# Patient Record
Sex: Female | Born: 1945
Health system: Southern US, Community
[De-identification: ages and names within clinical notes are randomized; demographics above are authoritative.]

## PROBLEM LIST (undated history)

## (undated) DIAGNOSIS — E079 Disorder of thyroid, unspecified: Secondary | ICD-10-CM

## (undated) DIAGNOSIS — F419 Anxiety disorder, unspecified: Secondary | ICD-10-CM

## (undated) DIAGNOSIS — N3281 Overactive bladder: Secondary | ICD-10-CM

## (undated) DIAGNOSIS — R1011 Right upper quadrant pain: Secondary | ICD-10-CM

## (undated) DIAGNOSIS — F32A Depression, unspecified: Secondary | ICD-10-CM

## (undated) DIAGNOSIS — C50919 Malignant neoplasm of unspecified site of unspecified female breast: Secondary | ICD-10-CM

## (undated) DIAGNOSIS — I10 Essential (primary) hypertension: Secondary | ICD-10-CM

## (undated) DIAGNOSIS — J189 Pneumonia, unspecified organism: Secondary | ICD-10-CM

## (undated) DIAGNOSIS — E039 Hypothyroidism, unspecified: Secondary | ICD-10-CM

## (undated) DIAGNOSIS — K219 Gastro-esophageal reflux disease without esophagitis: Secondary | ICD-10-CM

## (undated) DIAGNOSIS — G43909 Migraine, unspecified, not intractable, without status migrainosus: Secondary | ICD-10-CM

## (undated) DIAGNOSIS — M797 Fibromyalgia: Secondary | ICD-10-CM

## (undated) DIAGNOSIS — F329 Major depressive disorder, single episode, unspecified: Secondary | ICD-10-CM

## (undated) DIAGNOSIS — G4730 Sleep apnea, unspecified: Secondary | ICD-10-CM

## (undated) HISTORY — PX: TUBAL LIGATION: SHX77

## (undated) HISTORY — PX: OTHER SURGICAL HISTORY: SHX169

## (undated) HISTORY — PX: LIVER SURGERY: SHX698

## (undated) HISTORY — PX: APPENDECTOMY: SHX54

---

## 2000-08-09 ENCOUNTER — Other Ambulatory Visit: Admission: RE | Admit: 2000-08-09 | Discharge: 2000-08-09 | Payer: Self-pay | Admitting: *Deleted

## 2005-07-13 ENCOUNTER — Encounter (INDEPENDENT_AMBULATORY_CARE_PROVIDER_SITE_OTHER): Payer: Self-pay | Admitting: *Deleted

## 2005-08-06 ENCOUNTER — Ambulatory Visit: Payer: Self-pay | Admitting: Family Medicine

## 2005-08-17 ENCOUNTER — Ambulatory Visit: Payer: Self-pay | Admitting: Family Medicine

## 2005-12-17 ENCOUNTER — Ambulatory Visit: Payer: Self-pay | Admitting: Family Medicine

## 2006-05-03 ENCOUNTER — Ambulatory Visit: Payer: Self-pay | Admitting: Sports Medicine

## 2007-02-10 ENCOUNTER — Encounter (INDEPENDENT_AMBULATORY_CARE_PROVIDER_SITE_OTHER): Payer: Self-pay | Admitting: *Deleted

## 2007-07-15 ENCOUNTER — Emergency Department (HOSPITAL_COMMUNITY): Admission: EM | Admit: 2007-07-15 | Discharge: 2007-07-15 | Payer: Self-pay | Admitting: Emergency Medicine

## 2009-12-13 DIAGNOSIS — C50919 Malignant neoplasm of unspecified site of unspecified female breast: Secondary | ICD-10-CM

## 2009-12-13 HISTORY — PX: SIMPLE MASTECTOMY W/ SENTINEL NODE BIOPSY: SHX2410

## 2009-12-13 HISTORY — DX: Malignant neoplasm of unspecified site of unspecified female breast: C50.919

## 2011-01-27 ENCOUNTER — Other Ambulatory Visit: Payer: Self-pay | Admitting: Internal Medicine

## 2011-01-27 DIAGNOSIS — N281 Cyst of kidney, acquired: Secondary | ICD-10-CM

## 2011-01-27 DIAGNOSIS — K7689 Other specified diseases of liver: Secondary | ICD-10-CM

## 2011-01-29 ENCOUNTER — Other Ambulatory Visit: Payer: Self-pay | Admitting: Internal Medicine

## 2011-01-29 ENCOUNTER — Ambulatory Visit
Admission: RE | Admit: 2011-01-29 | Discharge: 2011-01-29 | Disposition: A | Discharge status: Home / Self Care | Payer: Medicare Other | Source: Ambulatory Visit | Attending: Internal Medicine | Admitting: Internal Medicine

## 2011-01-29 DIAGNOSIS — N281 Cyst of kidney, acquired: Secondary | ICD-10-CM

## 2011-01-29 DIAGNOSIS — K7689 Other specified diseases of liver: Secondary | ICD-10-CM

## 2011-01-29 MED ORDER — IOHEXOL 300 MG/ML  SOLN
100.0000 mL | Freq: Once | INTRAMUSCULAR | Status: AC | PRN
  Administered 2011-01-29: 100 mL via INTRAVENOUS

## 2011-03-09 ENCOUNTER — Other Ambulatory Visit: Payer: Self-pay | Admitting: Gastroenterology

## 2011-03-09 DIAGNOSIS — R1084 Generalized abdominal pain: Secondary | ICD-10-CM

## 2011-03-09 DIAGNOSIS — K5289 Other specified noninfective gastroenteritis and colitis: Secondary | ICD-10-CM

## 2011-03-17 ENCOUNTER — Other Ambulatory Visit: Payer: Medicare Other

## 2011-03-18 ENCOUNTER — Ambulatory Visit
Admission: RE | Admit: 2011-03-18 | Discharge: 2011-03-18 | Disposition: A | Discharge status: Home / Self Care | Payer: Medicare Other | Source: Ambulatory Visit | Attending: Gastroenterology | Admitting: Gastroenterology

## 2011-03-18 DIAGNOSIS — K5289 Other specified noninfective gastroenteritis and colitis: Secondary | ICD-10-CM

## 2011-03-18 DIAGNOSIS — R1084 Generalized abdominal pain: Secondary | ICD-10-CM

## 2011-04-06 ENCOUNTER — Other Ambulatory Visit (HOSPITAL_COMMUNITY): Payer: Self-pay | Admitting: Gastroenterology

## 2011-04-06 DIAGNOSIS — R1011 Right upper quadrant pain: Secondary | ICD-10-CM

## 2011-04-21 ENCOUNTER — Encounter (HOSPITAL_COMMUNITY): Payer: Self-pay

## 2011-04-21 ENCOUNTER — Encounter (HOSPITAL_COMMUNITY)
Admission: RE | Admit: 2011-04-21 | Discharge: 2011-04-21 | Disposition: A | Discharge status: Home / Self Care | Payer: Medicare Other | Source: Ambulatory Visit | Attending: Gastroenterology | Admitting: Gastroenterology

## 2011-04-21 DIAGNOSIS — R1011 Right upper quadrant pain: Secondary | ICD-10-CM | POA: Insufficient documentation

## 2011-04-21 HISTORY — DX: Right upper quadrant pain: R10.11

## 2011-04-21 MED ORDER — SINCALIDE 5 MCG IJ SOLR: 0.0200 ug/kg | Freq: Once | INTRAMUSCULAR | Status: DC

## 2011-04-21 MED ORDER — TECHNETIUM TC 99M MEBROFENIN IV KIT
5.0000 | PACK | Freq: Once | INTRAVENOUS | Status: AC | PRN
  Administered 2011-04-21: 5 via INTRAVENOUS

## 2011-05-03 ENCOUNTER — Other Ambulatory Visit: Payer: Self-pay | Admitting: Internal Medicine

## 2011-05-03 DIAGNOSIS — N281 Cyst of kidney, acquired: Secondary | ICD-10-CM

## 2011-05-05 ENCOUNTER — Other Ambulatory Visit: Payer: Medicare Other

## 2011-05-07 ENCOUNTER — Ambulatory Visit
Admission: RE | Admit: 2011-05-07 | Discharge: 2011-05-07 | Disposition: A | Discharge status: Home / Self Care | Payer: Medicare Other | Source: Ambulatory Visit | Attending: Internal Medicine | Admitting: Internal Medicine

## 2011-05-07 DIAGNOSIS — N281 Cyst of kidney, acquired: Secondary | ICD-10-CM

## 2011-09-27 LAB — POCT URINALYSIS DIP (DEVICE)
Bilirubin Urine: NEGATIVE
Glucose, UA: NEGATIVE
Ketones, ur: NEGATIVE
Operator id: 239701
Protein, ur: 100 — AB

## 2011-09-27 LAB — URINE CULTURE

## 2011-12-21 DIAGNOSIS — K219 Gastro-esophageal reflux disease without esophagitis: Secondary | ICD-10-CM | POA: Diagnosis not present

## 2011-12-21 DIAGNOSIS — Z79899 Other long term (current) drug therapy: Secondary | ICD-10-CM | POA: Diagnosis not present

## 2011-12-21 DIAGNOSIS — F341 Dysthymic disorder: Secondary | ICD-10-CM | POA: Diagnosis not present

## 2011-12-21 DIAGNOSIS — Z880 Allergy status to penicillin: Secondary | ICD-10-CM | POA: Diagnosis not present

## 2011-12-21 DIAGNOSIS — Z803 Family history of malignant neoplasm of breast: Secondary | ICD-10-CM | POA: Diagnosis not present

## 2011-12-21 DIAGNOSIS — J309 Allergic rhinitis, unspecified: Secondary | ICD-10-CM | POA: Diagnosis not present

## 2011-12-21 DIAGNOSIS — Z888 Allergy status to other drugs, medicaments and biological substances status: Secondary | ICD-10-CM | POA: Diagnosis not present

## 2011-12-21 DIAGNOSIS — D126 Benign neoplasm of colon, unspecified: Secondary | ICD-10-CM | POA: Diagnosis not present

## 2011-12-21 DIAGNOSIS — K5090 Crohn's disease, unspecified, without complications: Secondary | ICD-10-CM | POA: Diagnosis not present

## 2011-12-21 DIAGNOSIS — Z8049 Family history of malignant neoplasm of other genital organs: Secondary | ICD-10-CM | POA: Diagnosis not present

## 2011-12-21 DIAGNOSIS — Z8541 Personal history of malignant neoplasm of cervix uteri: Secondary | ICD-10-CM | POA: Diagnosis not present

## 2011-12-21 DIAGNOSIS — Z853 Personal history of malignant neoplasm of breast: Secondary | ICD-10-CM | POA: Diagnosis not present

## 2011-12-21 DIAGNOSIS — Z9010 Acquired absence of unspecified breast and nipple: Secondary | ICD-10-CM | POA: Diagnosis not present

## 2011-12-21 DIAGNOSIS — Z8042 Family history of malignant neoplasm of prostate: Secondary | ICD-10-CM | POA: Diagnosis not present

## 2012-01-14 DIAGNOSIS — G4733 Obstructive sleep apnea (adult) (pediatric): Secondary | ICD-10-CM | POA: Diagnosis not present

## 2012-02-28 DIAGNOSIS — R159 Full incontinence of feces: Secondary | ICD-10-CM | POA: Diagnosis not present

## 2012-02-28 DIAGNOSIS — K5090 Crohn's disease, unspecified, without complications: Secondary | ICD-10-CM | POA: Diagnosis not present

## 2012-04-12 DIAGNOSIS — R300 Dysuria: Secondary | ICD-10-CM | POA: Diagnosis not present

## 2012-04-12 DIAGNOSIS — R82998 Other abnormal findings in urine: Secondary | ICD-10-CM | POA: Diagnosis not present

## 2012-05-09 DIAGNOSIS — E785 Hyperlipidemia, unspecified: Secondary | ICD-10-CM | POA: Diagnosis not present

## 2012-05-09 DIAGNOSIS — R7301 Impaired fasting glucose: Secondary | ICD-10-CM | POA: Diagnosis not present

## 2012-05-09 DIAGNOSIS — E039 Hypothyroidism, unspecified: Secondary | ICD-10-CM | POA: Diagnosis not present

## 2012-05-09 DIAGNOSIS — I10 Essential (primary) hypertension: Secondary | ICD-10-CM | POA: Diagnosis not present

## 2012-05-09 DIAGNOSIS — R82998 Other abnormal findings in urine: Secondary | ICD-10-CM | POA: Diagnosis not present

## 2012-05-15 DIAGNOSIS — Z23 Encounter for immunization: Secondary | ICD-10-CM | POA: Diagnosis not present

## 2012-05-15 DIAGNOSIS — F329 Major depressive disorder, single episode, unspecified: Secondary | ICD-10-CM | POA: Diagnosis not present

## 2012-05-15 DIAGNOSIS — E785 Hyperlipidemia, unspecified: Secondary | ICD-10-CM | POA: Diagnosis not present

## 2012-05-15 DIAGNOSIS — I10 Essential (primary) hypertension: Secondary | ICD-10-CM | POA: Diagnosis not present

## 2012-05-15 DIAGNOSIS — E039 Hypothyroidism, unspecified: Secondary | ICD-10-CM | POA: Diagnosis not present

## 2012-05-15 DIAGNOSIS — Z0000 Encounter for general adult medical examination without abnormal findings: Secondary | ICD-10-CM | POA: Diagnosis not present

## 2012-05-15 DIAGNOSIS — K5090 Crohn's disease, unspecified, without complications: Secondary | ICD-10-CM | POA: Diagnosis not present

## 2012-05-29 ENCOUNTER — Other Ambulatory Visit: Payer: Self-pay | Admitting: Internal Medicine

## 2012-05-29 DIAGNOSIS — R222 Localized swelling, mass and lump, trunk: Secondary | ICD-10-CM

## 2012-05-30 ENCOUNTER — Ambulatory Visit
Admission: RE | Admit: 2012-05-30 | Discharge: 2012-05-30 | Disposition: A | Discharge status: Home / Self Care | Payer: Medicare Other | Source: Ambulatory Visit | Attending: Internal Medicine | Admitting: Internal Medicine

## 2012-05-30 DIAGNOSIS — J8410 Pulmonary fibrosis, unspecified: Secondary | ICD-10-CM | POA: Diagnosis not present

## 2012-05-30 DIAGNOSIS — R918 Other nonspecific abnormal finding of lung field: Secondary | ICD-10-CM | POA: Diagnosis not present

## 2012-05-30 DIAGNOSIS — R222 Localized swelling, mass and lump, trunk: Secondary | ICD-10-CM

## 2012-08-23 DIAGNOSIS — Z23 Encounter for immunization: Secondary | ICD-10-CM | POA: Diagnosis not present

## 2012-08-30 DIAGNOSIS — N393 Stress incontinence (female) (male): Secondary | ICD-10-CM | POA: Diagnosis not present

## 2012-08-30 DIAGNOSIS — N318 Other neuromuscular dysfunction of bladder: Secondary | ICD-10-CM | POA: Diagnosis not present

## 2012-08-30 DIAGNOSIS — R350 Frequency of micturition: Secondary | ICD-10-CM | POA: Diagnosis not present

## 2012-08-30 DIAGNOSIS — R3915 Urgency of urination: Secondary | ICD-10-CM | POA: Diagnosis not present

## 2012-09-21 DIAGNOSIS — K5090 Crohn's disease, unspecified, without complications: Secondary | ICD-10-CM | POA: Diagnosis not present

## 2012-09-21 DIAGNOSIS — R197 Diarrhea, unspecified: Secondary | ICD-10-CM | POA: Diagnosis not present

## 2012-10-05 DIAGNOSIS — R3915 Urgency of urination: Secondary | ICD-10-CM | POA: Diagnosis not present

## 2012-10-05 DIAGNOSIS — R197 Diarrhea, unspecified: Secondary | ICD-10-CM | POA: Diagnosis not present

## 2012-10-05 DIAGNOSIS — R350 Frequency of micturition: Secondary | ICD-10-CM | POA: Diagnosis not present

## 2012-10-05 DIAGNOSIS — N393 Stress incontinence (female) (male): Secondary | ICD-10-CM | POA: Diagnosis not present

## 2012-10-10 DIAGNOSIS — N318 Other neuromuscular dysfunction of bladder: Secondary | ICD-10-CM | POA: Diagnosis not present

## 2012-10-10 DIAGNOSIS — R350 Frequency of micturition: Secondary | ICD-10-CM | POA: Diagnosis not present

## 2012-10-10 DIAGNOSIS — Z124 Encounter for screening for malignant neoplasm of cervix: Secondary | ICD-10-CM | POA: Diagnosis not present

## 2012-10-10 DIAGNOSIS — R3915 Urgency of urination: Secondary | ICD-10-CM | POA: Diagnosis not present

## 2012-10-10 DIAGNOSIS — N393 Stress incontinence (female) (male): Secondary | ICD-10-CM | POA: Diagnosis not present

## 2012-10-19 DIAGNOSIS — R1904 Left lower quadrant abdominal swelling, mass and lump: Secondary | ICD-10-CM | POA: Diagnosis not present

## 2012-10-20 ENCOUNTER — Other Ambulatory Visit: Payer: Self-pay | Admitting: Gastroenterology

## 2012-10-20 DIAGNOSIS — K5090 Crohn's disease, unspecified, without complications: Secondary | ICD-10-CM | POA: Diagnosis not present

## 2012-10-20 DIAGNOSIS — R1031 Right lower quadrant pain: Secondary | ICD-10-CM

## 2012-10-20 DIAGNOSIS — R197 Diarrhea, unspecified: Secondary | ICD-10-CM | POA: Diagnosis not present

## 2012-10-30 ENCOUNTER — Ambulatory Visit
Admission: RE | Admit: 2012-10-30 | Discharge: 2012-10-30 | Disposition: A | Discharge status: Home / Self Care | Payer: Medicare Other | Source: Ambulatory Visit | Attending: Gastroenterology | Admitting: Gastroenterology

## 2012-10-30 DIAGNOSIS — R1031 Right lower quadrant pain: Secondary | ICD-10-CM

## 2012-10-30 DIAGNOSIS — K5090 Crohn's disease, unspecified, without complications: Secondary | ICD-10-CM | POA: Diagnosis not present

## 2012-10-30 DIAGNOSIS — N200 Calculus of kidney: Secondary | ICD-10-CM | POA: Diagnosis not present

## 2012-10-30 MED ORDER — IOHEXOL 300 MG/ML  SOLN
100.0000 mL | Freq: Once | INTRAMUSCULAR | Status: AC | PRN
  Administered 2012-10-30: 100 mL via INTRAVENOUS

## 2012-11-13 DIAGNOSIS — K5090 Crohn's disease, unspecified, without complications: Secondary | ICD-10-CM | POA: Diagnosis not present

## 2012-12-28 ENCOUNTER — Other Ambulatory Visit: Payer: Self-pay | Admitting: Internal Medicine

## 2012-12-28 DIAGNOSIS — R911 Solitary pulmonary nodule: Secondary | ICD-10-CM

## 2013-01-01 ENCOUNTER — Ambulatory Visit
Admission: RE | Admit: 2013-01-01 | Discharge: 2013-01-01 | Disposition: A | Discharge status: Home / Self Care | Payer: Medicare Other | Source: Ambulatory Visit | Attending: Internal Medicine | Admitting: Internal Medicine

## 2013-01-01 DIAGNOSIS — N200 Calculus of kidney: Secondary | ICD-10-CM | POA: Diagnosis not present

## 2013-01-01 DIAGNOSIS — R911 Solitary pulmonary nodule: Secondary | ICD-10-CM

## 2013-01-01 DIAGNOSIS — J984 Other disorders of lung: Secondary | ICD-10-CM | POA: Diagnosis not present

## 2013-01-02 ENCOUNTER — Other Ambulatory Visit: Payer: Self-pay | Admitting: Internal Medicine

## 2013-01-02 DIAGNOSIS — E041 Nontoxic single thyroid nodule: Secondary | ICD-10-CM

## 2013-01-08 DIAGNOSIS — J301 Allergic rhinitis due to pollen: Secondary | ICD-10-CM | POA: Diagnosis not present

## 2013-01-08 DIAGNOSIS — Z09 Encounter for follow-up examination after completed treatment for conditions other than malignant neoplasm: Secondary | ICD-10-CM | POA: Diagnosis not present

## 2013-01-08 DIAGNOSIS — K5090 Crohn's disease, unspecified, without complications: Secondary | ICD-10-CM | POA: Diagnosis not present

## 2013-01-08 DIAGNOSIS — E039 Hypothyroidism, unspecified: Secondary | ICD-10-CM | POA: Diagnosis not present

## 2013-01-08 DIAGNOSIS — Z79899 Other long term (current) drug therapy: Secondary | ICD-10-CM | POA: Diagnosis not present

## 2013-01-08 DIAGNOSIS — K219 Gastro-esophageal reflux disease without esophagitis: Secondary | ICD-10-CM | POA: Diagnosis not present

## 2013-01-08 DIAGNOSIS — Z853 Personal history of malignant neoplasm of breast: Secondary | ICD-10-CM | POA: Diagnosis not present

## 2013-01-09 ENCOUNTER — Other Ambulatory Visit: Payer: Medicare Other

## 2013-01-10 ENCOUNTER — Ambulatory Visit
Admission: RE | Admit: 2013-01-10 | Discharge: 2013-01-10 | Disposition: A | Discharge status: Home / Self Care | Payer: Medicare Other | Source: Ambulatory Visit | Attending: Internal Medicine | Admitting: Internal Medicine

## 2013-01-10 DIAGNOSIS — E042 Nontoxic multinodular goiter: Secondary | ICD-10-CM | POA: Diagnosis not present

## 2013-01-10 DIAGNOSIS — E041 Nontoxic single thyroid nodule: Secondary | ICD-10-CM | POA: Diagnosis not present

## 2013-01-11 ENCOUNTER — Other Ambulatory Visit: Payer: Self-pay | Admitting: Internal Medicine

## 2013-01-11 DIAGNOSIS — E041 Nontoxic single thyroid nodule: Secondary | ICD-10-CM

## 2013-01-16 ENCOUNTER — Ambulatory Visit
Admission: RE | Admit: 2013-01-16 | Discharge: 2013-01-16 | Disposition: A | Discharge status: Home / Self Care | Payer: Medicare Other | Source: Ambulatory Visit | Attending: Internal Medicine | Admitting: Internal Medicine

## 2013-01-16 ENCOUNTER — Other Ambulatory Visit (HOSPITAL_COMMUNITY)
Admission: RE | Admit: 2013-01-16 | Discharge: 2013-01-16 | Disposition: A | Discharge status: Home / Self Care | Payer: Medicare Other | Source: Ambulatory Visit | Attending: Diagnostic Radiology | Admitting: Diagnostic Radiology

## 2013-01-16 DIAGNOSIS — E041 Nontoxic single thyroid nodule: Secondary | ICD-10-CM | POA: Diagnosis not present

## 2013-01-16 DIAGNOSIS — E049 Nontoxic goiter, unspecified: Secondary | ICD-10-CM | POA: Insufficient documentation

## 2013-01-31 DIAGNOSIS — K5090 Crohn's disease, unspecified, without complications: Secondary | ICD-10-CM | POA: Diagnosis not present

## 2013-05-14 DIAGNOSIS — E785 Hyperlipidemia, unspecified: Secondary | ICD-10-CM | POA: Diagnosis not present

## 2013-05-14 DIAGNOSIS — E039 Hypothyroidism, unspecified: Secondary | ICD-10-CM | POA: Diagnosis not present

## 2013-05-14 DIAGNOSIS — I10 Essential (primary) hypertension: Secondary | ICD-10-CM | POA: Diagnosis not present

## 2013-05-14 DIAGNOSIS — R7301 Impaired fasting glucose: Secondary | ICD-10-CM | POA: Diagnosis not present

## 2013-05-18 DIAGNOSIS — F329 Major depressive disorder, single episode, unspecified: Secondary | ICD-10-CM | POA: Diagnosis not present

## 2013-05-18 DIAGNOSIS — K5090 Crohn's disease, unspecified, without complications: Secondary | ICD-10-CM | POA: Diagnosis not present

## 2013-05-18 DIAGNOSIS — M25519 Pain in unspecified shoulder: Secondary | ICD-10-CM | POA: Diagnosis not present

## 2013-05-18 DIAGNOSIS — R7301 Impaired fasting glucose: Secondary | ICD-10-CM | POA: Diagnosis not present

## 2013-05-18 DIAGNOSIS — E785 Hyperlipidemia, unspecified: Secondary | ICD-10-CM | POA: Diagnosis not present

## 2013-05-18 DIAGNOSIS — I10 Essential (primary) hypertension: Secondary | ICD-10-CM | POA: Diagnosis not present

## 2013-05-18 DIAGNOSIS — Z0000 Encounter for general adult medical examination without abnormal findings: Secondary | ICD-10-CM | POA: Diagnosis not present

## 2013-05-18 DIAGNOSIS — E039 Hypothyroidism, unspecified: Secondary | ICD-10-CM | POA: Diagnosis not present

## 2013-06-18 DIAGNOSIS — J029 Acute pharyngitis, unspecified: Secondary | ICD-10-CM | POA: Diagnosis not present

## 2013-06-20 DIAGNOSIS — M5030 Other cervical disc degeneration, unspecified cervical region: Secondary | ICD-10-CM | POA: Diagnosis not present

## 2013-06-20 DIAGNOSIS — M25519 Pain in unspecified shoulder: Secondary | ICD-10-CM | POA: Diagnosis not present

## 2013-06-20 DIAGNOSIS — G5600 Carpal tunnel syndrome, unspecified upper limb: Secondary | ICD-10-CM | POA: Diagnosis not present

## 2013-06-20 DIAGNOSIS — M25559 Pain in unspecified hip: Secondary | ICD-10-CM | POA: Diagnosis not present

## 2013-06-27 DIAGNOSIS — M5030 Other cervical disc degeneration, unspecified cervical region: Secondary | ICD-10-CM | POA: Diagnosis not present

## 2013-06-27 DIAGNOSIS — M25519 Pain in unspecified shoulder: Secondary | ICD-10-CM | POA: Diagnosis not present

## 2013-07-02 DIAGNOSIS — K5090 Crohn's disease, unspecified, without complications: Secondary | ICD-10-CM | POA: Diagnosis not present

## 2013-07-03 DIAGNOSIS — M25519 Pain in unspecified shoulder: Secondary | ICD-10-CM | POA: Diagnosis not present

## 2013-07-03 DIAGNOSIS — M5030 Other cervical disc degeneration, unspecified cervical region: Secondary | ICD-10-CM | POA: Diagnosis not present

## 2013-07-05 DIAGNOSIS — M5030 Other cervical disc degeneration, unspecified cervical region: Secondary | ICD-10-CM | POA: Diagnosis not present

## 2013-07-05 DIAGNOSIS — M25519 Pain in unspecified shoulder: Secondary | ICD-10-CM | POA: Diagnosis not present

## 2013-07-10 DIAGNOSIS — M5030 Other cervical disc degeneration, unspecified cervical region: Secondary | ICD-10-CM | POA: Diagnosis not present

## 2013-07-10 DIAGNOSIS — M25519 Pain in unspecified shoulder: Secondary | ICD-10-CM | POA: Diagnosis not present

## 2013-07-12 DIAGNOSIS — M5030 Other cervical disc degeneration, unspecified cervical region: Secondary | ICD-10-CM | POA: Diagnosis not present

## 2013-07-12 DIAGNOSIS — M25519 Pain in unspecified shoulder: Secondary | ICD-10-CM | POA: Diagnosis not present

## 2013-07-18 DIAGNOSIS — M5030 Other cervical disc degeneration, unspecified cervical region: Secondary | ICD-10-CM | POA: Diagnosis not present

## 2013-07-27 DIAGNOSIS — L909 Atrophic disorder of skin, unspecified: Secondary | ICD-10-CM | POA: Diagnosis not present

## 2013-07-27 DIAGNOSIS — L819 Disorder of pigmentation, unspecified: Secondary | ICD-10-CM | POA: Diagnosis not present

## 2013-07-27 DIAGNOSIS — C4441 Basal cell carcinoma of skin of scalp and neck: Secondary | ICD-10-CM | POA: Diagnosis not present

## 2013-07-31 DIAGNOSIS — M47812 Spondylosis without myelopathy or radiculopathy, cervical region: Secondary | ICD-10-CM | POA: Diagnosis not present

## 2013-08-01 DIAGNOSIS — M5030 Other cervical disc degeneration, unspecified cervical region: Secondary | ICD-10-CM | POA: Diagnosis not present

## 2013-08-09 DIAGNOSIS — C4441 Basal cell carcinoma of skin of scalp and neck: Secondary | ICD-10-CM | POA: Diagnosis not present

## 2013-08-09 DIAGNOSIS — L821 Other seborrheic keratosis: Secondary | ICD-10-CM | POA: Diagnosis not present

## 2013-08-09 DIAGNOSIS — R21 Rash and other nonspecific skin eruption: Secondary | ICD-10-CM | POA: Diagnosis not present

## 2013-08-09 DIAGNOSIS — Z85828 Personal history of other malignant neoplasm of skin: Secondary | ICD-10-CM | POA: Diagnosis not present

## 2013-08-29 DIAGNOSIS — M5030 Other cervical disc degeneration, unspecified cervical region: Secondary | ICD-10-CM | POA: Diagnosis not present

## 2013-08-29 DIAGNOSIS — Z23 Encounter for immunization: Secondary | ICD-10-CM | POA: Diagnosis not present

## 2013-09-04 DIAGNOSIS — H903 Sensorineural hearing loss, bilateral: Secondary | ICD-10-CM | POA: Diagnosis not present

## 2013-11-12 DIAGNOSIS — IMO0001 Reserved for inherently not codable concepts without codable children: Secondary | ICD-10-CM | POA: Diagnosis not present

## 2013-11-12 DIAGNOSIS — R7301 Impaired fasting glucose: Secondary | ICD-10-CM | POA: Diagnosis not present

## 2013-11-12 DIAGNOSIS — I10 Essential (primary) hypertension: Secondary | ICD-10-CM | POA: Diagnosis not present

## 2013-11-12 DIAGNOSIS — R51 Headache: Secondary | ICD-10-CM | POA: Diagnosis not present

## 2013-11-12 DIAGNOSIS — E785 Hyperlipidemia, unspecified: Secondary | ICD-10-CM | POA: Diagnosis not present

## 2013-11-12 DIAGNOSIS — E039 Hypothyroidism, unspecified: Secondary | ICD-10-CM | POA: Diagnosis not present

## 2013-11-12 DIAGNOSIS — K5090 Crohn's disease, unspecified, without complications: Secondary | ICD-10-CM | POA: Diagnosis not present

## 2013-11-29 DIAGNOSIS — H40039 Anatomical narrow angle, unspecified eye: Secondary | ICD-10-CM | POA: Diagnosis not present

## 2013-11-29 DIAGNOSIS — R51 Headache: Secondary | ICD-10-CM | POA: Diagnosis not present

## 2013-11-29 DIAGNOSIS — H5200 Hypermetropia, unspecified eye: Secondary | ICD-10-CM | POA: Diagnosis not present

## 2013-11-29 DIAGNOSIS — H259 Unspecified age-related cataract: Secondary | ICD-10-CM | POA: Diagnosis not present

## 2013-12-20 DIAGNOSIS — H4020X0 Unspecified primary angle-closure glaucoma, stage unspecified: Secondary | ICD-10-CM | POA: Diagnosis not present

## 2013-12-20 DIAGNOSIS — H40039 Anatomical narrow angle, unspecified eye: Secondary | ICD-10-CM | POA: Diagnosis not present

## 2013-12-24 DIAGNOSIS — Z6825 Body mass index (BMI) 25.0-25.9, adult: Secondary | ICD-10-CM | POA: Diagnosis not present

## 2013-12-24 DIAGNOSIS — R51 Headache: Secondary | ICD-10-CM | POA: Diagnosis not present

## 2013-12-24 DIAGNOSIS — IMO0001 Reserved for inherently not codable concepts without codable children: Secondary | ICD-10-CM | POA: Diagnosis not present

## 2013-12-27 DIAGNOSIS — H40039 Anatomical narrow angle, unspecified eye: Secondary | ICD-10-CM | POA: Diagnosis not present

## 2013-12-27 DIAGNOSIS — H4020X0 Unspecified primary angle-closure glaucoma, stage unspecified: Secondary | ICD-10-CM | POA: Diagnosis not present

## 2014-01-14 DIAGNOSIS — Z8049 Family history of malignant neoplasm of other genital organs: Secondary | ICD-10-CM | POA: Diagnosis not present

## 2014-01-14 DIAGNOSIS — D0590 Unspecified type of carcinoma in situ of unspecified breast: Secondary | ICD-10-CM | POA: Diagnosis not present

## 2014-01-14 DIAGNOSIS — Z885 Allergy status to narcotic agent status: Secondary | ICD-10-CM | POA: Diagnosis not present

## 2014-01-14 DIAGNOSIS — K5090 Crohn's disease, unspecified, without complications: Secondary | ICD-10-CM | POA: Diagnosis not present

## 2014-01-14 DIAGNOSIS — H269 Unspecified cataract: Secondary | ICD-10-CM | POA: Diagnosis not present

## 2014-01-14 DIAGNOSIS — E039 Hypothyroidism, unspecified: Secondary | ICD-10-CM | POA: Diagnosis not present

## 2014-01-14 DIAGNOSIS — Z881 Allergy status to other antibiotic agents status: Secondary | ICD-10-CM | POA: Diagnosis not present

## 2014-01-14 DIAGNOSIS — Z803 Family history of malignant neoplasm of breast: Secondary | ICD-10-CM | POA: Diagnosis not present

## 2014-01-14 DIAGNOSIS — N6089 Other benign mammary dysplasias of unspecified breast: Secondary | ICD-10-CM | POA: Diagnosis not present

## 2014-01-14 DIAGNOSIS — Z9010 Acquired absence of unspecified breast and nipple: Secondary | ICD-10-CM | POA: Diagnosis not present

## 2014-01-14 DIAGNOSIS — Z880 Allergy status to penicillin: Secondary | ICD-10-CM | POA: Diagnosis not present

## 2014-01-14 DIAGNOSIS — M76899 Other specified enthesopathies of unspecified lower limb, excluding foot: Secondary | ICD-10-CM | POA: Diagnosis not present

## 2014-01-14 DIAGNOSIS — Z79899 Other long term (current) drug therapy: Secondary | ICD-10-CM | POA: Diagnosis not present

## 2014-01-14 DIAGNOSIS — E785 Hyperlipidemia, unspecified: Secondary | ICD-10-CM | POA: Diagnosis not present

## 2014-01-14 DIAGNOSIS — I10 Essential (primary) hypertension: Secondary | ICD-10-CM | POA: Diagnosis not present

## 2014-03-05 DIAGNOSIS — Z9010 Acquired absence of unspecified breast and nipple: Secondary | ICD-10-CM | POA: Diagnosis not present

## 2014-03-05 DIAGNOSIS — Z853 Personal history of malignant neoplasm of breast: Secondary | ICD-10-CM | POA: Diagnosis not present

## 2014-03-09 ENCOUNTER — Emergency Department (INDEPENDENT_AMBULATORY_CARE_PROVIDER_SITE_OTHER): Payer: Medicare Other

## 2014-03-09 ENCOUNTER — Encounter (HOSPITAL_COMMUNITY): Payer: Self-pay | Admitting: Emergency Medicine

## 2014-03-09 ENCOUNTER — Emergency Department (INDEPENDENT_AMBULATORY_CARE_PROVIDER_SITE_OTHER)
Admission: EM | Admit: 2014-03-09 | Discharge: 2014-03-09 | Disposition: A | Discharge status: Home / Self Care | Payer: Medicare Other | Source: Home / Self Care | Attending: Emergency Medicine | Admitting: Emergency Medicine

## 2014-03-09 DIAGNOSIS — R05 Cough: Secondary | ICD-10-CM | POA: Diagnosis not present

## 2014-03-09 DIAGNOSIS — J069 Acute upper respiratory infection, unspecified: Secondary | ICD-10-CM | POA: Diagnosis not present

## 2014-03-09 DIAGNOSIS — R059 Cough, unspecified: Secondary | ICD-10-CM | POA: Diagnosis not present

## 2014-03-09 HISTORY — DX: Essential (primary) hypertension: I10

## 2014-03-09 HISTORY — DX: Migraine, unspecified, not intractable, without status migrainosus: G43.909

## 2014-03-09 HISTORY — DX: Sleep apnea, unspecified: G47.30

## 2014-03-09 HISTORY — DX: Disorder of thyroid, unspecified: E07.9

## 2014-03-09 HISTORY — DX: Overactive bladder: N32.81

## 2014-03-09 HISTORY — DX: Fibromyalgia: M79.7

## 2014-03-09 MED ORDER — IPRATROPIUM BROMIDE 0.06 % NA SOLN: 2.0000 | Freq: Four times a day (QID) | NASAL | Status: DC

## 2014-03-09 MED ORDER — BENZONATATE 100 MG PO CAPS: 100.0000 mg | ORAL_CAPSULE | Freq: Three times a day (TID) | ORAL | Status: DC | PRN

## 2014-03-09 NOTE — Discharge Instructions (Signed)
Your chest xray was normal. You can expect to cough for the next several days. Please use medications as prescribed for your symptoms. If no improvement over the next 6-7 days or you develop fever, please follow up with your primary care doctor.

## 2014-03-09 NOTE — ED Provider Notes (Signed)
CSN: [id number]     Arrival date & time 03/09/14  1858 History   First MD Initiated Contact with Patient 03/09/14 1954     Chief Complaint  Patient presents with  . URI   (Consider location/radiation/quality/duration/timing/severity/associated sxs/prior Treatment) Patient is a 68 y.o. female presenting with URI. The history is provided by the patient.  URI Presenting symptoms: congestion, cough, fatigue and rhinorrhea   Presenting symptoms: no fever and no sore throat   Severity:  Mild Onset quality:  Gradual Duration:  3 days Progression:  Unchanged Chronicity:  New Associated symptoms: no arthralgias, no headaches, no myalgias, no neck pain, no sinus pain, no sneezing, no swollen glands and no wheezing   Risk factors: sick contacts   Risk factors comment:  Husband recently ill with same   Past Medical History  Diagnosis Date  . RUQ pain   . Hypertension   . Thyroid disease   . Overactive bladder   . Migraine   . Sleep apnea   . Fibromyalgia    Past Surgical History  Procedure Laterality Date  . Appendectomy     History reviewed. No pertinent family history. History  Substance Use Topics  . Smoking status: Not on file  . Smokeless tobacco: Not on file  . Alcohol Use: Not on file   OB History   Grav Para Term Preterm Abortions TAB SAB Ect Mult Living                 Review of Systems  Constitutional: Positive for fatigue. Negative for fever.  HENT: Positive for congestion and rhinorrhea. Negative for sneezing and sore throat.   Respiratory: Positive for cough. Negative for wheezing.   Musculoskeletal: Negative for arthralgias, myalgias and neck pain.  Neurological: Negative for headaches.  All other systems reviewed and are negative.    Allergies  Codeine; Neosporin; and Penicillins  Home Medications   Current Outpatient Rx  Name  Route  Sig  Dispense  Refill  . AMITRIPTYLINE HCL PO   Oral   Take by mouth.         . AMLODIPINE BESYLATE PO    Oral   Take by mouth.         . Citalopram Hydrobromide (CELEXA PO)   Oral   Take by mouth.         Marland Kitchen LEVOTHYROXINE SODIUM PO   Oral   Take by mouth.         . Mesalamine (PENTASA PO)   Oral   Take by mouth.         . OXYBUTYNIN CHLORIDE PO   Oral   Take by mouth.         Marland Kitchen SIMVASTATIN PO   Oral   Take by mouth.         . benzonatate (TESSALON) 100 MG capsule   Oral   Take 1 capsule (100 mg total) by mouth 3 (three) times daily as needed for cough.   21 capsule   0   . ipratropium (ATROVENT) 0.06 % nasal spray   Each Nare   Place 2 sprays into both nostrils 4 (four) times daily. For nasal congestion   15 mL   0    BP 114/70  Pulse 76  Temp(Src) 97.9 F (36.6 C) (Oral)  Resp 18  SpO2 96% Physical Exam  Nursing note and vitals reviewed. Constitutional: She is oriented to person, place, and time. She appears well-developed and well-nourished. No distress.  HENT:  Head: Normocephalic  and atraumatic.  Right Ear: Hearing, tympanic membrane, external ear and ear canal normal.  Left Ear: Hearing, tympanic membrane, external ear and ear canal normal.  Nose: Nose normal.  Mouth/Throat: Uvula is midline, oropharynx is clear and moist and mucous membranes are normal.  Eyes: Conjunctivae are normal.  Neck: Normal range of motion. Neck supple.  Cardiovascular: Normal rate, regular rhythm and normal heart sounds.   Pulmonary/Chest: Effort normal and breath sounds normal. No respiratory distress. She has no wheezes.  Abdominal: Soft. Bowel sounds are normal. She exhibits no distension. There is no tenderness.  Musculoskeletal: Normal range of motion.  Lymphadenopathy:    She has no cervical adenopathy.  Neurological: She is alert and oriented to person, place, and time.  Skin: Skin is warm and dry.  Psychiatric: She has a normal mood and affect. Her behavior is normal.    ED Course  Procedures (including critical care time) Labs Review Labs Reviewed - No  data to display Imaging Review Dg Chest 2 View  03/09/2014   CLINICAL DATA:  Cough.  EXAM: CHEST  2 VIEW  COMPARISON:  CT CHEST W/O CM dated 01/01/2013  FINDINGS: Heart and mediastinal contours are within normal limits. No focal opacities or effusions. No acute bony abnormality.  IMPRESSION: No active cardiopulmonary disease.   Electronically Signed   By: Rolm Baptise M.D.   On: 03/09/2014 20:13     MDM   1. URI (upper respiratory infection)   CXR without evidence of acute disease. Symptomatic care at home with tylenol. Atrovent nasal spray and tessalon. PCP follow up if symptoms do not improve over next 5-7 days.    [place], [place] 03/09/14 2035

## 2014-03-09 NOTE — ED Notes (Signed)
C/o cold sx which started [place] States she has a cough and congestion mucinex taken for tx Cough is not productive

## 2014-03-09 NOTE — ED Provider Notes (Signed)
Medical screening examination/treatment/procedure(s) were performed by non-physician practitioner and as supervising physician I was immediately available for consultation/collaboration.  Philipp Deputy, M.D.  Harden Mo, MD 03/09/14 2204

## 2014-04-26 DIAGNOSIS — IMO0001 Reserved for inherently not codable concepts without codable children: Secondary | ICD-10-CM | POA: Diagnosis not present

## 2014-04-26 DIAGNOSIS — Z87891 Personal history of nicotine dependence: Secondary | ICD-10-CM | POA: Diagnosis not present

## 2014-04-26 DIAGNOSIS — E785 Hyperlipidemia, unspecified: Secondary | ICD-10-CM | POA: Diagnosis not present

## 2014-04-26 DIAGNOSIS — Z79899 Other long term (current) drug therapy: Secondary | ICD-10-CM | POA: Diagnosis not present

## 2014-04-26 DIAGNOSIS — K5090 Crohn's disease, unspecified, without complications: Secondary | ICD-10-CM | POA: Diagnosis not present

## 2014-04-26 DIAGNOSIS — M129 Arthropathy, unspecified: Secondary | ICD-10-CM | POA: Diagnosis not present

## 2014-04-26 DIAGNOSIS — K219 Gastro-esophageal reflux disease without esophagitis: Secondary | ICD-10-CM | POA: Diagnosis not present

## 2014-04-26 DIAGNOSIS — G4733 Obstructive sleep apnea (adult) (pediatric): Secondary | ICD-10-CM | POA: Diagnosis not present

## 2014-04-26 DIAGNOSIS — E039 Hypothyroidism, unspecified: Secondary | ICD-10-CM | POA: Diagnosis not present

## 2014-04-26 DIAGNOSIS — Z421 Encounter for breast reconstruction following mastectomy: Secondary | ICD-10-CM | POA: Diagnosis not present

## 2014-04-26 DIAGNOSIS — G43909 Migraine, unspecified, not intractable, without status migrainosus: Secondary | ICD-10-CM | POA: Diagnosis not present

## 2014-04-26 DIAGNOSIS — L905 Scar conditions and fibrosis of skin: Secondary | ICD-10-CM | POA: Diagnosis not present

## 2014-04-26 DIAGNOSIS — I10 Essential (primary) hypertension: Secondary | ICD-10-CM | POA: Diagnosis not present

## 2014-04-26 DIAGNOSIS — H9190 Unspecified hearing loss, unspecified ear: Secondary | ICD-10-CM | POA: Diagnosis not present

## 2014-04-26 DIAGNOSIS — Z853 Personal history of malignant neoplasm of breast: Secondary | ICD-10-CM | POA: Diagnosis not present

## 2014-04-27 DIAGNOSIS — G4733 Obstructive sleep apnea (adult) (pediatric): Secondary | ICD-10-CM | POA: Diagnosis not present

## 2014-04-27 DIAGNOSIS — Z421 Encounter for breast reconstruction following mastectomy: Secondary | ICD-10-CM | POA: Diagnosis not present

## 2014-04-27 DIAGNOSIS — IMO0001 Reserved for inherently not codable concepts without codable children: Secondary | ICD-10-CM | POA: Diagnosis not present

## 2014-04-27 DIAGNOSIS — M129 Arthropathy, unspecified: Secondary | ICD-10-CM | POA: Diagnosis not present

## 2014-04-27 DIAGNOSIS — I10 Essential (primary) hypertension: Secondary | ICD-10-CM | POA: Diagnosis not present

## 2014-04-27 DIAGNOSIS — Z853 Personal history of malignant neoplasm of breast: Secondary | ICD-10-CM | POA: Diagnosis not present

## 2014-05-10 ENCOUNTER — Emergency Department (INDEPENDENT_AMBULATORY_CARE_PROVIDER_SITE_OTHER)
Admission: EM | Admit: 2014-05-10 | Discharge: 2014-05-10 | Disposition: A | Discharge status: Home / Self Care | Payer: Medicare Other | Source: Home / Self Care

## 2014-05-10 ENCOUNTER — Encounter (HOSPITAL_COMMUNITY): Payer: Self-pay | Admitting: Emergency Medicine

## 2014-05-10 DIAGNOSIS — N390 Urinary tract infection, site not specified: Secondary | ICD-10-CM

## 2014-05-10 HISTORY — DX: Malignant neoplasm of unspecified site of unspecified female breast: C50.919

## 2014-05-10 LAB — POCT URINALYSIS DIP (DEVICE)
Bilirubin Urine: NEGATIVE
Glucose, UA: NEGATIVE mg/dL
KETONES UR: NEGATIVE mg/dL
Nitrite: POSITIVE — AB
PH: 5.5 (ref 5.0–8.0)
Protein, ur: 100 mg/dL — AB
Specific Gravity, Urine: 1.025 (ref 1.005–1.030)
Urobilinogen, UA: 0.2 mg/dL (ref 0.0–1.0)

## 2014-05-10 MED ORDER — FLUCONAZOLE 150 MG PO TABS
150.0000 mg | ORAL_TABLET | Freq: Every day | ORAL | Status: DC
Start: 2014-05-10 — End: 2015-04-19

## 2014-05-10 MED ORDER — SULFAMETHOXAZOLE-TMP DS 800-160 MG PO TABS: 1.0000 | ORAL_TABLET | Freq: Two times a day (BID) | ORAL | Status: DC

## 2014-05-10 MED ORDER — PHENAZOPYRIDINE HCL 100 MG PO TABS: 100.0000 mg | ORAL_TABLET | Freq: Three times a day (TID) | ORAL | Status: DC | PRN

## 2014-05-10 NOTE — ED Provider Notes (Signed)
CSN: [id number]     Arrival date & time 05/10/14  1845 History   None    Chief Complaint  Patient presents with  . Cystitis   (Consider location/radiation/quality/duration/timing/severity/associated sxs/prior Treatment) HPI  Dysuria, frequency, started yesterday. Pelvic discomfort. Denies fevers or back pain. No radiation for cancer. Multiple recent rounds of antibioitics. H/o recurrent UTIs.    Past Medical History  Diagnosis Date  . RUQ pain   . Hypertension   . Thyroid disease   . Overactive bladder   . Migraine   . Sleep apnea   . Fibromyalgia   . Breast cancer    Past Surgical History  Procedure Laterality Date  . Appendectomy    . Simple mastectomy w/ sentinel node biopsy Bilateral 2011   History reviewed. No pertinent family history. History  Substance Use Topics  . Smoking status: Never Smoker   . Smokeless tobacco: Not on file  . Alcohol Use: No   OB History   Grav Para Term Preterm Abortions TAB SAB Ect Mult Living                 Review of Systems  Constitutional: Negative for fever and fatigue.  Genitourinary: Positive for dysuria, urgency, frequency and pelvic pain. Negative for hematuria and flank pain.  All other systems reviewed and are negative.   Allergies  Codeine; Neosporin; and Penicillins  Home Medications   Prior to Admission medications   Medication Sig Start Date End Date Taking? Authorizing Provider  AMITRIPTYLINE HCL PO Take by mouth.   Yes Historical Provider, MD  AMLODIPINE BESYLATE PO Take by mouth.   Yes Historical Provider, MD  Citalopram Hydrobromide (CELEXA PO) Take by mouth.   Yes Historical Provider, MD  diazepam (VALIUM) 5 MG tablet Take 5 mg by mouth every 6 (six) hours as needed for anxiety.   Yes Historical Provider, MD  fexofenadine (ALLEGRA) 180 MG tablet Take 180 mg by mouth daily.   Yes Historical Provider, MD  fluticasone (FLONASE) 50 MCG/ACT nasal spray Place into both nostrils daily.   Yes Historical Provider,  MD  ipratropium (ATROVENT) 0.06 % nasal spray Place 2 sprays into both nostrils 4 (four) times daily. For nasal congestion 03/09/14  Yes Annett Gula [place], [place]  LEVOTHYROXINE SODIUM PO Take by mouth.   Yes Historical Provider, MD  Mesalamine (PENTASA PO) Take by mouth.   Yes Historical Provider, MD  OXYBUTYNIN CHLORIDE PO Take by mouth.   Yes Historical Provider, MD  oxyCODONE-acetaminophen (ROXICET) 5-325 MG/5ML solution Take by mouth every 4 (four) hours as needed for severe pain.   Yes Historical Provider, MD  SIMVASTATIN PO Take by mouth.   Yes Historical Provider, MD  benzonatate (TESSALON) 100 MG capsule Take 1 capsule (100 mg total) by mouth 3 (three) times daily as needed for cough. 03/09/14   Lahoma Rocker, PA  phenazopyridine (PYRIDIUM) 100 MG tablet Take 1 tablet (100 mg total) by mouth 3 (three) times daily as needed for pain. 05/10/14   Waldemar Dickens, MD  sulfamethoxazole-trimethoprim (BACTRIM DS) 800-160 MG per tablet Take 1 tablet by mouth 2 (two) times daily. 05/10/14   Waldemar Dickens, MD   BP 133/70  Pulse 93  Temp(Src) 98.2 F (36.8 C) (Oral)  Resp 18  SpO2 98% Physical Exam  Constitutional: She is oriented to person, place, and time. She appears well-developed and well-nourished. No distress.  HENT:  Head: Normocephalic and atraumatic.  Eyes: EOM are normal. Pupils are equal, round, and reactive to  light.  Neck: Normal range of motion. No thyromegaly present.  Cardiovascular: Normal rate, regular rhythm, normal heart sounds and intact distal pulses.  Exam reveals no gallop and no friction rub.   No murmur heard. Pulmonary/Chest: Effort normal and breath sounds normal. No respiratory distress. She has no wheezes. She has no rales. She exhibits no tenderness.  Abdominal: Soft.  Suprapubic tenderness to palpation  Musculoskeletal: Normal range of motion.  Lymphadenopathy:    She has no cervical adenopathy.  Neurological: She is alert and oriented to person,  place, and time.  Skin: Skin is warm. No rash noted. She is not diaphoretic. No erythema. No pallor.  Psychiatric: She has a normal mood and affect. Her behavior is normal. Judgment and thought content normal.    ED Course  Procedures (including critical care time) Labs Review Labs Reviewed  POCT URINALYSIS DIP (DEVICE) - Abnormal; Notable for the following:    Hgb urine dipstick MODERATE (*)    Protein, ur 100 (*)    Nitrite POSITIVE (*)    Leukocytes, UA LARGE (*)    All other components within normal limits  URINE CULTURE    Imaging Review No results found.   MDM   1. UTI (lower urinary tract infection)    68yo F w/ UTI. Pt on multiple rounds of antibiotics recently and h/o recurrent UTIs. PCN allergy so some concern for treating w/ Keflex (though more theoretical risk than actual). Will treat w/ Bactrim. UCX sent for sensitivities and speciation. Pyridium for relief. No signs of pyelo - precautions given and all questions answered  Linna Darner, MD Family Medicine PGY-3 05/10/2014, 8:48 PM      Waldemar Dickens, MD 05/10/14 [phone]

## 2014-05-10 NOTE — Discharge Instructions (Signed)
You have a significant urinary tract infection Due to your allergies and concerns for resistance I have prescribed bactrim We will let you know if this needs to be changed Please also start the pyridium for symptomatic relief if desired.

## 2014-05-10 NOTE — ED Notes (Signed)
Reportedly has been having painful, frequent UA since yesterday, concern for UTI

## 2014-05-11 NOTE — ED Provider Notes (Signed)
Medical screening examination/treatment/procedure(s) were performed by a resident physician and as supervising physician I was immediately available for consultation/collaboration.  Philipp Deputy, M.D.  Harden Mo, MD 05/11/14 [phone]

## 2014-06-21 DIAGNOSIS — Z86010 Personal history of colonic polyps: Secondary | ICD-10-CM | POA: Diagnosis not present

## 2014-06-21 DIAGNOSIS — K5090 Crohn's disease, unspecified, without complications: Secondary | ICD-10-CM | POA: Diagnosis not present

## 2014-07-15 DIAGNOSIS — R82998 Other abnormal findings in urine: Secondary | ICD-10-CM | POA: Diagnosis not present

## 2014-07-15 DIAGNOSIS — E785 Hyperlipidemia, unspecified: Secondary | ICD-10-CM | POA: Diagnosis not present

## 2014-07-15 DIAGNOSIS — I10 Essential (primary) hypertension: Secondary | ICD-10-CM | POA: Diagnosis not present

## 2014-07-15 DIAGNOSIS — E039 Hypothyroidism, unspecified: Secondary | ICD-10-CM | POA: Diagnosis not present

## 2014-07-15 DIAGNOSIS — R7301 Impaired fasting glucose: Secondary | ICD-10-CM | POA: Diagnosis not present

## 2014-07-22 DIAGNOSIS — I10 Essential (primary) hypertension: Secondary | ICD-10-CM | POA: Diagnosis not present

## 2014-07-22 DIAGNOSIS — E039 Hypothyroidism, unspecified: Secondary | ICD-10-CM | POA: Diagnosis not present

## 2014-07-22 DIAGNOSIS — Z23 Encounter for immunization: Secondary | ICD-10-CM | POA: Diagnosis not present

## 2014-07-22 DIAGNOSIS — J984 Other disorders of lung: Secondary | ICD-10-CM | POA: Diagnosis not present

## 2014-07-22 DIAGNOSIS — Z79899 Other long term (current) drug therapy: Secondary | ICD-10-CM | POA: Diagnosis not present

## 2014-07-22 DIAGNOSIS — R5381 Other malaise: Secondary | ICD-10-CM | POA: Diagnosis not present

## 2014-07-22 DIAGNOSIS — E785 Hyperlipidemia, unspecified: Secondary | ICD-10-CM | POA: Diagnosis not present

## 2014-07-22 DIAGNOSIS — Z0000 Encounter for general adult medical examination without abnormal findings: Secondary | ICD-10-CM | POA: Diagnosis not present

## 2014-07-22 DIAGNOSIS — K5090 Crohn's disease, unspecified, without complications: Secondary | ICD-10-CM | POA: Diagnosis not present

## 2014-07-22 DIAGNOSIS — R7301 Impaired fasting glucose: Secondary | ICD-10-CM | POA: Diagnosis not present

## 2014-08-02 DIAGNOSIS — Z853 Personal history of malignant neoplasm of breast: Secondary | ICD-10-CM | POA: Diagnosis not present

## 2014-08-02 DIAGNOSIS — M129 Arthropathy, unspecified: Secondary | ICD-10-CM | POA: Diagnosis not present

## 2014-08-02 DIAGNOSIS — E039 Hypothyroidism, unspecified: Secondary | ICD-10-CM | POA: Diagnosis not present

## 2014-08-02 DIAGNOSIS — Z87891 Personal history of nicotine dependence: Secondary | ICD-10-CM | POA: Diagnosis not present

## 2014-08-02 DIAGNOSIS — Z421 Encounter for breast reconstruction following mastectomy: Secondary | ICD-10-CM | POA: Diagnosis not present

## 2014-08-02 DIAGNOSIS — IMO0002 Reserved for concepts with insufficient information to code with codable children: Secondary | ICD-10-CM | POA: Diagnosis not present

## 2014-08-02 DIAGNOSIS — G4733 Obstructive sleep apnea (adult) (pediatric): Secondary | ICD-10-CM | POA: Diagnosis not present

## 2014-08-02 DIAGNOSIS — Z883 Allergy status to other anti-infective agents status: Secondary | ICD-10-CM | POA: Diagnosis not present

## 2014-08-02 DIAGNOSIS — K219 Gastro-esophageal reflux disease without esophagitis: Secondary | ICD-10-CM | POA: Diagnosis not present

## 2014-08-02 DIAGNOSIS — N318 Other neuromuscular dysfunction of bladder: Secondary | ICD-10-CM | POA: Diagnosis not present

## 2014-08-02 DIAGNOSIS — IMO0001 Reserved for inherently not codable concepts without codable children: Secondary | ICD-10-CM | POA: Diagnosis not present

## 2014-08-02 DIAGNOSIS — Z880 Allergy status to penicillin: Secondary | ICD-10-CM | POA: Diagnosis not present

## 2014-08-02 DIAGNOSIS — H9190 Unspecified hearing loss, unspecified ear: Secondary | ICD-10-CM | POA: Diagnosis not present

## 2014-08-02 DIAGNOSIS — Z803 Family history of malignant neoplasm of breast: Secondary | ICD-10-CM | POA: Diagnosis not present

## 2014-08-02 DIAGNOSIS — L905 Scar conditions and fibrosis of skin: Secondary | ICD-10-CM | POA: Diagnosis not present

## 2014-08-02 DIAGNOSIS — E785 Hyperlipidemia, unspecified: Secondary | ICD-10-CM | POA: Diagnosis not present

## 2014-08-02 DIAGNOSIS — Z79899 Other long term (current) drug therapy: Secondary | ICD-10-CM | POA: Diagnosis not present

## 2014-08-02 DIAGNOSIS — Z9109 Other allergy status, other than to drugs and biological substances: Secondary | ICD-10-CM | POA: Diagnosis not present

## 2014-08-02 DIAGNOSIS — Z885 Allergy status to narcotic agent status: Secondary | ICD-10-CM | POA: Diagnosis not present

## 2014-08-02 DIAGNOSIS — I10 Essential (primary) hypertension: Secondary | ICD-10-CM | POA: Diagnosis not present

## 2014-08-14 ENCOUNTER — Other Ambulatory Visit: Payer: Self-pay

## 2014-08-14 DIAGNOSIS — L723 Sebaceous cyst: Secondary | ICD-10-CM | POA: Diagnosis not present

## 2014-08-14 DIAGNOSIS — L821 Other seborrheic keratosis: Secondary | ICD-10-CM | POA: Diagnosis not present

## 2014-08-14 DIAGNOSIS — D239 Other benign neoplasm of skin, unspecified: Secondary | ICD-10-CM | POA: Diagnosis not present

## 2014-08-14 DIAGNOSIS — D1801 Hemangioma of skin and subcutaneous tissue: Secondary | ICD-10-CM | POA: Diagnosis not present

## 2014-08-14 DIAGNOSIS — Z85828 Personal history of other malignant neoplasm of skin: Secondary | ICD-10-CM | POA: Diagnosis not present

## 2014-08-14 DIAGNOSIS — D485 Neoplasm of uncertain behavior of skin: Secondary | ICD-10-CM | POA: Diagnosis not present

## 2014-09-26 ENCOUNTER — Other Ambulatory Visit: Payer: Self-pay

## 2014-09-26 DIAGNOSIS — D235 Other benign neoplasm of skin of trunk: Secondary | ICD-10-CM | POA: Diagnosis not present

## 2014-09-26 DIAGNOSIS — B353 Tinea pedis: Secondary | ICD-10-CM | POA: Diagnosis not present

## 2014-09-26 DIAGNOSIS — Z85828 Personal history of other malignant neoplasm of skin: Secondary | ICD-10-CM | POA: Diagnosis not present

## 2014-09-26 DIAGNOSIS — D485 Neoplasm of uncertain behavior of skin: Secondary | ICD-10-CM | POA: Diagnosis not present

## 2014-10-01 DIAGNOSIS — Z9013 Acquired absence of bilateral breasts and nipples: Secondary | ICD-10-CM | POA: Diagnosis not present

## 2014-10-01 DIAGNOSIS — Z853 Personal history of malignant neoplasm of breast: Secondary | ICD-10-CM | POA: Diagnosis not present

## 2014-10-15 DIAGNOSIS — Z23 Encounter for immunization: Secondary | ICD-10-CM | POA: Diagnosis not present

## 2014-11-15 DIAGNOSIS — H02401 Unspecified ptosis of right eyelid: Secondary | ICD-10-CM | POA: Diagnosis not present

## 2014-11-15 DIAGNOSIS — H40033 Anatomical narrow angle, bilateral: Secondary | ICD-10-CM | POA: Diagnosis not present

## 2014-11-15 DIAGNOSIS — H04123 Dry eye syndrome of bilateral lacrimal glands: Secondary | ICD-10-CM | POA: Diagnosis not present

## 2014-11-15 DIAGNOSIS — H02831 Dermatochalasis of right upper eyelid: Secondary | ICD-10-CM | POA: Diagnosis not present

## 2014-12-18 DIAGNOSIS — Z9013 Acquired absence of bilateral breasts and nipples: Secondary | ICD-10-CM | POA: Diagnosis not present

## 2014-12-18 DIAGNOSIS — H0234 Blepharochalasis left upper eyelid: Secondary | ICD-10-CM | POA: Diagnosis not present

## 2014-12-18 DIAGNOSIS — H5340 Unspecified visual field defects: Secondary | ICD-10-CM | POA: Diagnosis not present

## 2014-12-18 DIAGNOSIS — H0231 Blepharochalasis right upper eyelid: Secondary | ICD-10-CM | POA: Diagnosis not present

## 2014-12-18 DIAGNOSIS — Z853 Personal history of malignant neoplasm of breast: Secondary | ICD-10-CM | POA: Diagnosis not present

## 2015-02-26 DIAGNOSIS — E079 Disorder of thyroid, unspecified: Secondary | ICD-10-CM | POA: Diagnosis not present

## 2015-02-26 DIAGNOSIS — E785 Hyperlipidemia, unspecified: Secondary | ICD-10-CM | POA: Diagnosis not present

## 2015-02-26 DIAGNOSIS — Z803 Family history of malignant neoplasm of breast: Secondary | ICD-10-CM | POA: Diagnosis not present

## 2015-02-26 DIAGNOSIS — Z853 Personal history of malignant neoplasm of breast: Secondary | ICD-10-CM | POA: Diagnosis not present

## 2015-02-26 DIAGNOSIS — Z9013 Acquired absence of bilateral breasts and nipples: Secondary | ICD-10-CM | POA: Diagnosis not present

## 2015-02-26 DIAGNOSIS — D0502 Lobular carcinoma in situ of left breast: Secondary | ICD-10-CM | POA: Diagnosis not present

## 2015-03-17 ENCOUNTER — Emergency Department (INDEPENDENT_AMBULATORY_CARE_PROVIDER_SITE_OTHER)
Admission: EM | Admit: 2015-03-17 | Discharge: 2015-03-17 | Disposition: A | Discharge status: Home / Self Care | Payer: Medicare Other | Source: Home / Self Care | Attending: Family Medicine | Admitting: Family Medicine

## 2015-03-17 ENCOUNTER — Encounter (HOSPITAL_COMMUNITY): Payer: Self-pay

## 2015-03-17 DIAGNOSIS — J029 Acute pharyngitis, unspecified: Secondary | ICD-10-CM

## 2015-03-17 LAB — POCT RAPID STREP A: Streptococcus, Group A Screen (Direct): NEGATIVE

## 2015-03-17 NOTE — Discharge Instructions (Signed)
Your strep test was negative. Your swab will be held for a three day culture and if results indicate the need for additional treatment, you will be notified by phone. In the meantime, please use warm salt water gargles and tylenol or ibuprofen as directed on packaging for discomfort and follow up if symptoms do not improve.  Pharyngitis Pharyngitis is redness, pain, and swelling (inflammation) of your pharynx.  CAUSES  Pharyngitis is usually caused by infection. Most of the time, these infections are from viruses (viral) and are part of a cold. However, sometimes pharyngitis is caused by bacteria (bacterial). Pharyngitis can also be caused by allergies. Viral pharyngitis may be spread from person to person by coughing, sneezing, and personal items or utensils (cups, forks, spoons, toothbrushes). Bacterial pharyngitis may be spread from person to person by more intimate contact, such as kissing.  SIGNS AND SYMPTOMS  Symptoms of pharyngitis include:   Sore throat.   Tiredness (fatigue).   Low-grade fever.   Headache.  Joint pain and muscle aches.  Skin rashes.  Swollen lymph nodes.  Plaque-like film on throat or tonsils (often seen with bacterial pharyngitis). DIAGNOSIS  Your health care provider will ask you questions about your illness and your symptoms. Your medical history, along with a physical exam, is often all that is needed to diagnose pharyngitis. Sometimes, a rapid strep test is done. Other lab tests may also be done, depending on the suspected cause.  TREATMENT  Viral pharyngitis will usually get better in 3-4 days without the use of medicine. Bacterial pharyngitis is treated with medicines that kill germs (antibiotics).  HOME CARE INSTRUCTIONS   Drink enough water and fluids to keep your urine clear or pale yellow.   Only take over-the-counter or prescription medicines as directed by your health care provider:   If you are prescribed antibiotics, make sure you finish  them even if you start to feel better.   Do not take aspirin.   Get lots of rest.   Gargle with 8 oz of salt water ( tsp of salt per 1 qt of water) as often as every 1-2 hours to soothe your throat.   Throat lozenges (if you are not at risk for choking) or sprays may be used to soothe your throat. SEEK MEDICAL CARE IF:   You have large, tender lumps in your neck.  You have a rash.  You cough up green, yellow-brown, or bloody spit. SEEK IMMEDIATE MEDICAL CARE IF:   Your neck becomes stiff.  You drool or are unable to swallow liquids.  You vomit or are unable to keep medicines or liquids down.  You have severe pain that does not go away with the use of recommended medicines.  You have trouble breathing (not caused by a stuffy nose). MAKE SURE YOU:   Understand these instructions.  Will watch your condition.  Will get help right away if you are not doing well or get worse. Document Released: 11/29/2005 Document Revised: 09/19/2013 Document Reviewed: 08/06/2013 [facility] Patient Information 2015 [place], [place]. This information is not intended to replace advice given to you by your health care provider. Make sure you discuss any questions you have with your health care provider.  Sore Throat A sore throat is pain, burning, irritation, or scratchiness of the throat. There is often pain or tenderness when swallowing or talking. A sore throat may be accompanied by other symptoms, such as coughing, sneezing, fever, and swollen neck glands. A sore throat is often the first sign of  another sickness, such as a cold, flu, strep throat, or mononucleosis (commonly known as mono). Most sore throats go away without medical treatment. CAUSES  The most common causes of a sore throat include:  A viral infection, such as a cold, flu, or mono.  A bacterial infection, such as strep throat, tonsillitis, or whooping cough.  Seasonal allergies.  Dryness in the air.  Irritants, such as  smoke or pollution.  Gastroesophageal reflux disease (GERD). HOME CARE INSTRUCTIONS   Only take over-the-counter medicines as directed by your caregiver.  Drink enough fluids to keep your urine clear or pale yellow.  Rest as needed.  Try using throat sprays, lozenges, or sucking on hard candy to ease any pain (if older than 4 years or as directed).  Sip warm liquids, such as broth, herbal tea, or warm water with honey to relieve pain temporarily. You may also eat or drink cold or frozen liquids such as frozen ice pops.  Gargle with salt water (mix 1 tsp salt with 8 oz of water).  Do not smoke and avoid secondhand smoke.  Put a cool-mist humidifier in your bedroom at night to moisten the air. You can also turn on a hot shower and sit in the bathroom with the door closed for 5-10 minutes. SEEK IMMEDIATE MEDICAL CARE IF:  You have difficulty breathing.  You are unable to swallow fluids, soft foods, or your saliva.  You have increased swelling in the throat.  Your sore throat does not get better in 7 days.  You have nausea and vomiting.  You have a fever or persistent symptoms for more than 2-3 days.  You have a fever and your symptoms suddenly get worse. MAKE SURE YOU:   Understand these instructions.  Will watch your condition.  Will get help right away if you are not doing well or get worse. Document Released: 01/06/2005 Document Revised: 11/15/2012 Document Reviewed: 08/06/2012 [facility] Patient Information 2015 [place], [place]. This information is not intended to replace advice given to you by your health care provider. Make sure you discuss any questions you have with your health care provider.  Salt Water Gargle This solution will help make your mouth and throat feel better. HOME CARE INSTRUCTIONS   Mix 1 teaspoon of salt in 8 ounces of warm water.  Gargle with this solution as much or often as you need or as directed. Swish and gargle gently if you have any sores  or wounds in your mouth.  Do not swallow this mixture. Document Released: 09/02/2004 Document Revised: 02/21/2012 Document Reviewed: 01/24/2009 [facility] Patient Information 2015 [place], [place]. This information is not intended to replace advice given to you by your health care provider. Make sure you discuss any questions you have with your health care provider.

## 2015-03-17 NOTE — ED Provider Notes (Signed)
CSN: [id number]     Arrival date & time 03/17/15  5916 History   First MD Initiated Contact with Patient 03/17/15 1017     Chief Complaint  Patient presents with  . Sore Throat   (Consider location/radiation/quality/duration/timing/severity/associated sxs/prior Treatment) HPI Comments: No fever Non smoker No meds currently being taken for known seasonal environmental allergies.  Patient is a 69 y.o. female presenting with pharyngitis. The history is provided by the patient.  Sore Throat This is a new problem. Episode onset: 4 days. The problem occurs constantly. The problem has not changed since onset.Associated symptoms comments: Nasal congestion , post nasal drainage, slight cough. .    Past Medical History  Diagnosis Date  . RUQ pain   . Hypertension   . Thyroid disease   . Overactive bladder   . Migraine   . Sleep apnea   . Fibromyalgia   . Breast cancer    Past Surgical History  Procedure Laterality Date  . Appendectomy    . Simple mastectomy w/ sentinel node biopsy Bilateral 2011   History reviewed. No pertinent family history. History  Substance Use Topics  . Smoking status: Never Smoker   . Smokeless tobacco: Not on file  . Alcohol Use: No   OB History    No data available     Review of Systems  All other systems reviewed and are negative.   Allergies  Codeine; Neosporin; and Penicillins  Home Medications   Prior to Admission medications   Medication Sig Start Date End Date Taking? Authorizing Provider  AMITRIPTYLINE HCL PO Take by mouth.    Historical Provider, MD  AMLODIPINE BESYLATE PO Take by mouth.    Historical Provider, MD  benzonatate (TESSALON) 100 MG capsule Take 1 capsule (100 mg total) by mouth 3 (three) times daily as needed for cough. 03/09/14   Audelia Hives Iasiah Ozment, PA  Citalopram Hydrobromide (CELEXA PO) Take by mouth.    Historical Provider, MD  diazepam (VALIUM) 5 MG tablet Take 5 mg by mouth every 6 (six) hours as needed for  anxiety.    Historical Provider, MD  fexofenadine (ALLEGRA) 180 MG tablet Take 180 mg by mouth daily.    Historical Provider, MD  fluconazole (DIFLUCAN) 150 MG tablet Take 1 tablet (150 mg total) by mouth daily. Repeat dose in 3 days 05/10/14   Waldemar Dickens, MD  fluticasone [facility]) 50 MCG/ACT nasal spray Place into both nostrils daily.    Historical Provider, MD  ipratropium (ATROVENT) 0.06 % nasal spray Place 2 sprays into both nostrils 4 (four) times daily. For nasal congestion 03/09/14   Audelia Hives Kaine Mcquillen, PA  LEVOTHYROXINE SODIUM PO Take by mouth.    Historical Provider, MD  Mesalamine (PENTASA PO) Take by mouth.    Historical Provider, MD  OXYBUTYNIN CHLORIDE PO Take by mouth.    Historical Provider, MD  oxyCODONE-acetaminophen (ROXICET) 5-325 MG/5ML solution Take by mouth every 4 (four) hours as needed for severe pain.    Historical Provider, MD  phenazopyridine (PYRIDIUM) 100 MG tablet Take 1 tablet (100 mg total) by mouth 3 (three) times daily as needed for pain. 05/10/14   Waldemar Dickens, MD  SIMVASTATIN PO Take by mouth.    Historical Provider, MD  sulfamethoxazole-trimethoprim (BACTRIM DS) 800-160 MG per tablet Take 1 tablet by mouth 2 (two) times daily. 05/10/14   Waldemar Dickens, MD   BP 128/64 mmHg  Pulse 76  Temp(Src) 97.9 F (36.6 C) (Oral)  Resp 16  SpO2 98% Physical Exam  Constitutional: She is oriented to person, place, and time. She appears well-developed and well-nourished.  HENT:  Head: Normocephalic and atraumatic.  Right Ear: Hearing, tympanic membrane, external ear and ear canal normal.  Left Ear: Hearing, tympanic membrane, external ear and ear canal normal.  Nose: Nose normal.  Mouth/Throat: Uvula is midline and mucous membranes are normal. No trismus in the jaw. No uvula swelling. Posterior oropharyngeal erythema present. No oropharyngeal exudate, posterior oropharyngeal edema or tonsillar abscesses.    Outlined region contains multiple small 64m  shallow yellow ulcers on erythematous base.  Eyes: Conjunctivae are normal.  Neck: Normal range of motion. Neck supple.  Cardiovascular: Normal rate, regular rhythm and normal heart sounds.   Pulmonary/Chest: Effort normal and breath sounds normal.  Musculoskeletal: Normal range of motion.  Lymphadenopathy:    She has no cervical adenopathy.  Neurological: She is alert and oriented to person, place, and time.  Skin: Skin is warm and dry.  Psychiatric: She has a normal mood and affect. Her behavior is normal.  Nursing note and vitals reviewed.   ED Course  Procedures (including critical care time) Labs Review Labs Reviewed  POCT RAPID STREP A ([facility] CARE ONLY)    Imaging Review No results found.   MDM   1. Pharyngitis   Rapid strep negative. Exam suggests a viral etiology Will contact patient by phone if throat culture results indicate the need for additional treatment. Symptomatic care at home.     JLutricia Feil PUtah04/04/16 [phone]

## 2015-03-17 NOTE — ED Notes (Signed)
Concerned about ST x past 5 days. Will be going OOT in [place] to [place] , and wants tobe sure she is okay before that trip

## 2015-03-19 LAB — CULTURE, GROUP A STREP: Strep A Culture: NEGATIVE

## 2015-04-06 ENCOUNTER — Emergency Department (INDEPENDENT_AMBULATORY_CARE_PROVIDER_SITE_OTHER)
Admission: EM | Admit: 2015-04-06 | Discharge: 2015-04-06 | Disposition: A | Discharge status: Home / Self Care | Payer: Medicare Other | Source: Home / Self Care

## 2015-04-06 ENCOUNTER — Encounter (HOSPITAL_COMMUNITY): Payer: Self-pay | Admitting: *Deleted

## 2015-04-06 DIAGNOSIS — J40 Bronchitis, not specified as acute or chronic: Secondary | ICD-10-CM | POA: Diagnosis not present

## 2015-04-06 MED ORDER — PREDNISONE 50 MG PO TABS: ORAL_TABLET | ORAL | Status: DC

## 2015-04-06 MED ORDER — DOXYCYCLINE HYCLATE 100 MG PO CAPS: 100.0000 mg | ORAL_CAPSULE | Freq: Two times a day (BID) | ORAL | Status: DC

## 2015-04-06 NOTE — ED Provider Notes (Signed)
CSN: [id number]     Arrival date & time 04/06/15  1026 History   None    Chief Complaint  Patient presents with  . URI   (Consider location/radiation/quality/duration/timing/severity/associated sxs/prior Treatment) HPI  She is a 69 year old woman here for evaluation of cough. She states her symptoms have been going on for about 4 weeks. It initially started as nasal congestion, rhinorrhea, mild sore throat. In the last week she has developed a worsening nonproductive cough. She reports chest congestion. She had an episode 2 days ago where she felt short of breath, but this has resolved. No fevers or chills. Her appetite is slightly decreased.  She is taking an allergy pill, Flonase, Mucinex with minimal improvement.  Past Medical History  Diagnosis Date  . RUQ pain   . Hypertension   . Thyroid disease   . Overactive bladder   . Migraine   . Sleep apnea   . Fibromyalgia   . Breast cancer    Past Surgical History  Procedure Laterality Date  . Appendectomy    . Simple mastectomy w/ sentinel node biopsy Bilateral 2011   History reviewed. No pertinent family history. History  Substance Use Topics  . Smoking status: Never Smoker   . Smokeless tobacco: Not on file  . Alcohol Use: No   OB History    No data available     Review of Systems  Constitutional: Positive for appetite change. Negative for fever and chills.  HENT: Positive for congestion, postnasal drip, rhinorrhea and sore throat.   Respiratory: Positive for cough and shortness of breath (not current). Negative for wheezing.   Gastrointestinal: Negative for nausea and vomiting.    Allergies  Codeine; Neosporin; and Penicillins  Home Medications   Prior to Admission medications   Medication Sig Start Date End Date Taking? Authorizing Provider  AMITRIPTYLINE HCL PO Take by mouth.    Historical Provider, MD  AMLODIPINE BESYLATE PO Take by mouth.    Historical Provider, MD  benzonatate (TESSALON) 100 MG capsule Take  1 capsule (100 mg total) by mouth 3 (three) times daily as needed for cough. 03/09/14   Audelia Hives Presson, PA  Citalopram Hydrobromide (CELEXA PO) Take by mouth.    Historical Provider, MD  diazepam (VALIUM) 5 MG tablet Take 5 mg by mouth every 6 (six) hours as needed for anxiety.    Historical Provider, MD  doxycycline (VIBRAMYCIN) 100 MG capsule Take 1 capsule (100 mg total) by mouth 2 (two) times daily. 04/06/15   Melony Overly, MD  fexofenadine (ALLEGRA) 180 MG tablet Take 180 mg by mouth daily.    Historical Provider, MD  fluconazole (DIFLUCAN) 150 MG tablet Take 1 tablet (150 mg total) by mouth daily. Repeat dose in 3 days 05/10/14   Waldemar Dickens, MD  fluticasone [facility]) 50 MCG/ACT nasal spray Place into both nostrils daily.    Historical Provider, MD  ipratropium (ATROVENT) 0.06 % nasal spray Place 2 sprays into both nostrils 4 (four) times daily. For nasal congestion 03/09/14   Audelia Hives Presson, PA  LEVOTHYROXINE SODIUM PO Take by mouth.    Historical Provider, MD  Mesalamine (PENTASA PO) Take by mouth.    Historical Provider, MD  OXYBUTYNIN CHLORIDE PO Take by mouth.    Historical Provider, MD  oxyCODONE-acetaminophen (ROXICET) 5-325 MG/5ML solution Take by mouth every 4 (four) hours as needed for severe pain.    Historical Provider, MD  phenazopyridine (PYRIDIUM) 100 MG tablet Take 1 tablet (100 mg total)  by mouth 3 (three) times daily as needed for pain. 05/10/14   Waldemar Dickens, MD  predniSONE (DELTASONE) 50 MG tablet Take 1 pill daily for 5 days. 04/06/15   Melony Overly, MD  SIMVASTATIN PO Take by mouth.    Historical Provider, MD  sulfamethoxazole-trimethoprim (BACTRIM DS) 800-160 MG per tablet Take 1 tablet by mouth 2 (two) times daily. 05/10/14   Waldemar Dickens, MD   BP 132/80 mmHg  Pulse 81  Temp(Src) 97.7 F (36.5 C) (Oral)  Resp 16  SpO2 95% Physical Exam  Constitutional: She is oriented to person, place, and time. She appears well-developed and well-nourished.  No distress.  HENT:  Nose: Nose normal.  Mouth/Throat: Oropharynx is clear and moist. No oropharyngeal exudate.  Neck: Neck supple.  Cardiovascular: Normal rate, regular rhythm and normal heart sounds.   No murmur heard. Pulmonary/Chest: Effort normal. No respiratory distress. She has no wheezes. She has no rales.  Lymphadenopathy:    She has no cervical adenopathy.  Neurological: She is alert and oriented to person, place, and time.    ED Course  Procedures (including critical care time) Labs Review Labs Reviewed - No data to display  Imaging Review No results found.   MDM   1. Bronchitis    We'll treat with prednisone and doxycycline. Continue allergy medications and Mucinex. Return precautions reviewed.    Melony Overly, MD 04/06/15 [phone]

## 2015-04-06 NOTE — ED Notes (Signed)
Pt  Reports   Symptoms  Of  Cough  Congestion    Headache          With  Symptoms  For  About  1  Month    Started  Out  As  Sinus drainage   Post  Nasal  Drip     Pt is  Sitting  Upright on the exam table speaking in  Complete  sentances

## 2015-04-06 NOTE — Discharge Instructions (Signed)
You have bronchitis. Take doxycycline and prednisone as prescribed. Continue the allergy medicines. Continue the Mucinex. Make sure you are drinking plenty of fluids to help thin the mucus. Anything with caffeine does not count. If you develop fevers, vomiting, shortness of breath, please go to the emergency room.

## 2015-04-19 ENCOUNTER — Emergency Department (INDEPENDENT_AMBULATORY_CARE_PROVIDER_SITE_OTHER): Payer: Medicare Other

## 2015-04-19 ENCOUNTER — Encounter (HOSPITAL_COMMUNITY): Payer: Self-pay

## 2015-04-19 ENCOUNTER — Emergency Department (INDEPENDENT_AMBULATORY_CARE_PROVIDER_SITE_OTHER)
Admission: EM | Admit: 2015-04-19 | Discharge: 2015-04-19 | Disposition: A | Discharge status: Home / Self Care | Payer: Medicare Other | Source: Home / Self Care | Attending: Family Medicine | Admitting: Family Medicine

## 2015-04-19 DIAGNOSIS — R05 Cough: Secondary | ICD-10-CM

## 2015-04-19 DIAGNOSIS — R059 Cough, unspecified: Secondary | ICD-10-CM

## 2015-04-19 MED ORDER — METHYLPREDNISOLONE ACETATE 80 MG/ML IJ SUSP
INTRAMUSCULAR | Status: AC
  Filled 2015-04-19: qty 1

## 2015-04-19 MED ORDER — ALBUTEROL SULFATE HFA 108 (90 BASE) MCG/ACT IN AERS: 1.0000 | INHALATION_SPRAY | Freq: Four times a day (QID) | RESPIRATORY_TRACT | Status: DC | PRN

## 2015-04-19 MED ORDER — METHYLPREDNISOLONE ACETATE 80 MG/ML IJ SUSP
80.0000 mg | Freq: Once | INTRAMUSCULAR | Status: AC
  Administered 2015-04-19: 80 mg via INTRAMUSCULAR

## 2015-04-19 NOTE — ED Provider Notes (Addendum)
CSN: [id number]     Arrival date & time 04/19/15  1200 History   First MD Initiated Contact with Patient 04/19/15 1219     Chief Complaint  Patient presents with  . Cough   Note from 04/06/2015 HPI  She is a 69 year old woman here for evaluation of cough. She states her symptoms have been going on for about 4 weeks. It initially started as nasal congestion, rhinorrhea, mild sore throat. In the last week she has developed a worsening nonproductive cough. She reports chest congestion. She had an episode 2 days ago where she felt short of breath, but this has resolved. No fevers or chills. Her appetite is slightly decreased. She is taking an allergy pill, Flonase, Mucinex with minimal improvement.   Patient is a 69 y.o. female presenting with cough. The history is provided by the patient and the spouse.  Cough Cough characteristics:  Non-productive and paroxysmal Severity:  Moderate Onset quality:  Gradual Duration:  6 weeks Timing:  Constant Progression:  Waxing and waning Chronicity:  New Smoker: no   Context: occupational exposure and upper respiratory infection   Context: not animal exposure, not exposure to allergens, not fumes, not sick contacts, not smoke exposure, not weather changes and not with activity   Relieved by:  Cough suppressants, beta-agonist inhaler, rest and fluids Worsened by:  Nothing tried Ineffective treatments:  Beta-agonist inhaler, decongestant and cough suppressants Associated symptoms: rhinorrhea and sore throat   Associated symptoms: no chest pain, no chills, no diaphoresis, no ear fullness, no ear pain, no eye discharge, no fever, no headaches, no myalgias, no rash, no shortness of breath, no sinus congestion, no weight loss and no wheezing   Rhinorrhea:    Quality:  Clear   Severity:  Mild   Duration:  6 weeks   Timing:  Constant   Progression:  Waxing and waning Risk factors: recent travel   Risk factors: no chemical exposure and no recent infection    patient recently went to [place]. Patient describes the cough as loose but nonproductive.  She has GERD which is being treated with PPI.  She cannot get in to her primary care provider, Dr. Brigitte Pulse.  Past Medical History  Diagnosis Date  . RUQ pain   . Hypertension   . Thyroid disease   . Overactive bladder   . Migraine   . Sleep apnea   . Fibromyalgia   . Breast cancer    Past Surgical History  Procedure Laterality Date  . Appendectomy    . Simple mastectomy w/ sentinel node biopsy Bilateral 2011   History reviewed. No pertinent family history. History  Substance Use Topics  . Smoking status: Never Smoker   . Smokeless tobacco: Not on file  . Alcohol Use: No   OB History    No data available     Review of Systems  Constitutional: Negative for fever, chills, weight loss and diaphoresis.  HENT: Positive for rhinorrhea and sore throat. Negative for ear pain.   Eyes: Negative for discharge.  Respiratory: Positive for cough. Negative for shortness of breath and wheezing.   Cardiovascular: Negative for chest pain.  Endocrine: Negative.   Genitourinary: Negative.   Musculoskeletal: Negative.  Negative for myalgias.  Skin: Negative.  Negative for rash.  Allergic/Immunologic: Positive for environmental allergies.  Neurological: Negative.  Negative for headaches.  Psychiatric/Behavioral: Negative.     Allergies  Codeine; Neosporin; and Penicillins  Home Medications   Prior to Admission medications   Medication Sig Start Date  End Date Taking? Authorizing Provider  AMITRIPTYLINE HCL PO Take by mouth.    Historical Provider, MD  AMLODIPINE BESYLATE PO Take by mouth.    Historical Provider, MD  Citalopram Hydrobromide (CELEXA PO) Take by mouth.    Historical Provider, MD  diazepam (VALIUM) 5 MG tablet Take 5 mg by mouth every 6 (six) hours as needed for anxiety.    Historical Provider, MD  fexofenadine (ALLEGRA) 180 MG tablet Take 180 mg by mouth daily.    Historical  Provider, MD  fluticasone (FLONASE) 50 MCG/ACT nasal spray Place into both nostrils daily.    Historical Provider, MD  ipratropium (ATROVENT) 0.06 % nasal spray Place 2 sprays into both nostrils 4 (four) times daily. For nasal congestion 03/09/14   Audelia Hives Presson, PA  LEVOTHYROXINE SODIUM PO Take by mouth.    Historical Provider, MD  Mesalamine (PENTASA PO) Take by mouth.    Historical Provider, MD  OXYBUTYNIN CHLORIDE PO Take by mouth.    Historical Provider, MD  SIMVASTATIN PO Take by mouth.    Historical Provider, MD   BP 137/66 mmHg  Pulse 71  Temp(Src) 97.7 F (36.5 C) (Oral)  Resp 16  SpO2 96% Physical Exam  Constitutional: She is oriented to person, place, and time. She appears well-developed and well-nourished.  HENT:  Head: Normocephalic and atraumatic.  Right Ear: External ear normal.  Left Ear: External ear normal.  Mildly erythematous oropharynx, dry  Eyes: Conjunctivae and EOM are normal. Pupils are equal, round, and reactive to light.  Neck: Normal range of motion. Neck supple.  Cardiovascular: Normal rate, regular rhythm and normal heart sounds.   Pulmonary/Chest: Effort normal.  ronchi left  Musculoskeletal: Normal range of motion.  Neurological: She is alert and oriented to person, place, and time.  Skin: Skin is warm and dry.  Psychiatric: She has a normal mood and affect. Her behavior is normal. Thought content normal.    ED Course  Procedures (including critical care time) Labs Review Labs Reviewed - No data to display  Imaging Review Dg Chest 2 View  04/19/2015   CLINICAL DATA:  Cough for 1 month.  EXAM: CHEST  2 VIEW  COMPARISON:  03/09/2014  FINDINGS: Lung volumes are low. There is linear opacity at the lung bases seen anteriorly on the lateral view, consistent with atelectasis. No areas of lung consolidation is seen to suggest pneumonia. There is no pulmonary edema. No pleural effusion or pneumothorax.  Cardiac silhouette is normal in size. No  mediastinal or hilar masses or evidence of adenopathy.  Bony thorax is demineralized. There is a slight depression of the upper endplate of what appears to be T11, stable.  IMPRESSION: No acute cardiopulmonary disease. No significant change from the prior study.   Electronically Signed   By: Lajean Manes M.D.   On: 04/19/2015 12:43     MDM  Cough  Given Depomedrol 80 mg IM.   Robyn Haber, MD 04/19/15 1254  Robyn Haber, MD 04/30/15 [phone]

## 2015-04-19 NOTE — Discharge Instructions (Signed)
Chest x-ray shows some signs of bronchitis.  Otherwise things look good.  Hopefully the inhaler along with the shot will get the cough to subside.

## 2015-04-19 NOTE — ED Notes (Signed)
Reportedly sick for 1 month. MD evaluation only

## 2015-04-30 DIAGNOSIS — B359 Dermatophytosis, unspecified: Secondary | ICD-10-CM | POA: Diagnosis present

## 2015-04-30 DIAGNOSIS — M25511 Pain in right shoulder: Secondary | ICD-10-CM | POA: Diagnosis not present

## 2015-04-30 DIAGNOSIS — J181 Lobar pneumonia, unspecified organism: Secondary | ICD-10-CM | POA: Diagnosis not present

## 2015-04-30 DIAGNOSIS — Z853 Personal history of malignant neoplasm of breast: Secondary | ICD-10-CM

## 2015-04-30 DIAGNOSIS — I10 Essential (primary) hypertension: Secondary | ICD-10-CM | POA: Diagnosis present

## 2015-04-30 DIAGNOSIS — Z9049 Acquired absence of other specified parts of digestive tract: Secondary | ICD-10-CM | POA: Diagnosis present

## 2015-04-30 DIAGNOSIS — J189 Pneumonia, unspecified organism: Principal | ICD-10-CM | POA: Diagnosis present

## 2015-04-30 DIAGNOSIS — Z880 Allergy status to penicillin: Secondary | ICD-10-CM

## 2015-04-30 DIAGNOSIS — M797 Fibromyalgia: Secondary | ICD-10-CM | POA: Diagnosis present

## 2015-04-30 DIAGNOSIS — R079 Chest pain, unspecified: Secondary | ICD-10-CM | POA: Diagnosis not present

## 2015-04-30 DIAGNOSIS — G4730 Sleep apnea, unspecified: Secondary | ICD-10-CM | POA: Diagnosis present

## 2015-04-30 DIAGNOSIS — E039 Hypothyroidism, unspecified: Secondary | ICD-10-CM | POA: Diagnosis present

## 2015-04-30 DIAGNOSIS — E785 Hyperlipidemia, unspecified: Secondary | ICD-10-CM | POA: Diagnosis present

## 2015-04-30 DIAGNOSIS — J9601 Acute respiratory failure with hypoxia: Secondary | ICD-10-CM | POA: Diagnosis not present

## 2015-04-30 DIAGNOSIS — Z881 Allergy status to other antibiotic agents status: Secondary | ICD-10-CM

## 2015-04-30 DIAGNOSIS — K5090 Crohn's disease, unspecified, without complications: Secondary | ICD-10-CM | POA: Diagnosis present

## 2015-04-30 DIAGNOSIS — R0602 Shortness of breath: Secondary | ICD-10-CM | POA: Diagnosis not present

## 2015-04-30 DIAGNOSIS — N3281 Overactive bladder: Secondary | ICD-10-CM | POA: Diagnosis not present

## 2015-04-30 DIAGNOSIS — Z885 Allergy status to narcotic agent status: Secondary | ICD-10-CM

## 2015-05-01 ENCOUNTER — Emergency Department (HOSPITAL_COMMUNITY): Payer: Medicare Other

## 2015-05-01 ENCOUNTER — Inpatient Hospital Stay (HOSPITAL_COMMUNITY)
Admission: EM | Admit: 2015-05-01 | Discharge: 2015-05-04 | DRG: 193 | Disposition: A | Discharge status: Home / Self Care | Payer: Medicare Other | Attending: Internal Medicine | Admitting: Internal Medicine

## 2015-05-01 ENCOUNTER — Encounter (HOSPITAL_COMMUNITY): Payer: Self-pay | Admitting: Emergency Medicine

## 2015-05-01 DIAGNOSIS — Z880 Allergy status to penicillin: Secondary | ICD-10-CM | POA: Diagnosis not present

## 2015-05-01 DIAGNOSIS — J9601 Acute respiratory failure with hypoxia: Secondary | ICD-10-CM | POA: Diagnosis not present

## 2015-05-01 DIAGNOSIS — E785 Hyperlipidemia, unspecified: Secondary | ICD-10-CM | POA: Diagnosis present

## 2015-05-01 DIAGNOSIS — E039 Hypothyroidism, unspecified: Secondary | ICD-10-CM | POA: Diagnosis present

## 2015-05-01 DIAGNOSIS — B359 Dermatophytosis, unspecified: Secondary | ICD-10-CM | POA: Diagnosis present

## 2015-05-01 DIAGNOSIS — Z885 Allergy status to narcotic agent status: Secondary | ICD-10-CM | POA: Diagnosis not present

## 2015-05-01 DIAGNOSIS — E038 Other specified hypothyroidism: Secondary | ICD-10-CM | POA: Diagnosis not present

## 2015-05-01 DIAGNOSIS — J9621 Acute and chronic respiratory failure with hypoxia: Secondary | ICD-10-CM | POA: Diagnosis not present

## 2015-05-01 DIAGNOSIS — G4730 Sleep apnea, unspecified: Secondary | ICD-10-CM | POA: Diagnosis present

## 2015-05-01 DIAGNOSIS — K5090 Crohn's disease, unspecified, without complications: Secondary | ICD-10-CM | POA: Diagnosis present

## 2015-05-01 DIAGNOSIS — J189 Pneumonia, unspecified organism: Secondary | ICD-10-CM | POA: Diagnosis present

## 2015-05-01 DIAGNOSIS — R079 Chest pain, unspecified: Secondary | ICD-10-CM

## 2015-05-01 DIAGNOSIS — Z9049 Acquired absence of other specified parts of digestive tract: Secondary | ICD-10-CM | POA: Diagnosis present

## 2015-05-01 DIAGNOSIS — Z853 Personal history of malignant neoplasm of breast: Secondary | ICD-10-CM | POA: Diagnosis not present

## 2015-05-01 DIAGNOSIS — I10 Essential (primary) hypertension: Secondary | ICD-10-CM | POA: Diagnosis not present

## 2015-05-01 DIAGNOSIS — J181 Lobar pneumonia, unspecified organism: Secondary | ICD-10-CM | POA: Diagnosis not present

## 2015-05-01 DIAGNOSIS — N3281 Overactive bladder: Secondary | ICD-10-CM | POA: Diagnosis present

## 2015-05-01 DIAGNOSIS — R0602 Shortness of breath: Secondary | ICD-10-CM | POA: Diagnosis not present

## 2015-05-01 DIAGNOSIS — Z881 Allergy status to other antibiotic agents status: Secondary | ICD-10-CM | POA: Diagnosis not present

## 2015-05-01 DIAGNOSIS — M25511 Pain in right shoulder: Secondary | ICD-10-CM | POA: Diagnosis present

## 2015-05-01 DIAGNOSIS — K50919 Crohn's disease, unspecified, with unspecified complications: Secondary | ICD-10-CM

## 2015-05-01 DIAGNOSIS — M797 Fibromyalgia: Secondary | ICD-10-CM | POA: Diagnosis present

## 2015-05-01 LAB — COMPREHENSIVE METABOLIC PANEL
ALBUMIN: 3.7 g/dL (ref 3.5–5.0)
ALK PHOS: 107 U/L (ref 38–126)
ALT: 88 U/L — AB (ref 14–54)
ALT: 80 U/L — AB (ref 14–54)
AST: 37 U/L (ref 15–41)
AST: 33 U/L (ref 15–41)
Albumin: 3.9 g/dL (ref 3.5–5.0)
Alkaline Phosphatase: 112 U/L (ref 38–126)
Anion gap: 11 (ref 5–15)
Anion gap: 10 (ref 5–15)
BILIRUBIN TOTAL: 1.2 mg/dL (ref 0.3–1.2)
BILIRUBIN TOTAL: 0.9 mg/dL (ref 0.3–1.2)
BUN: 19 mg/dL (ref 6–20)
BUN: 12 mg/dL (ref 6–20)
CHLORIDE: 102 mmol/L (ref 101–111)
CO2: 25 mmol/L (ref 22–32)
CO2: 24 mmol/L (ref 22–32)
Calcium: 8.8 mg/dL — ABNORMAL LOW (ref 8.9–10.3)
Calcium: 8.8 mg/dL — ABNORMAL LOW (ref 8.9–10.3)
Chloride: 97 mmol/L — ABNORMAL LOW (ref 101–111)
Creatinine, Ser: 0.97 mg/dL (ref 0.44–1.00)
Creatinine, Ser: 0.79 mg/dL (ref 0.44–1.00)
GFR calc Af Amer: >60 mL/min (ref >60)
GFR calc non Af Amer: >60 mL/min (ref >60)
GFR, EST NON AFRICAN AMERICAN: 58 mL/min — AB (ref >60)
GLUCOSE: 121 mg/dL — AB (ref 65–99)
Glucose, Bld: 108 mg/dL — ABNORMAL HIGH (ref 65–99)
POTASSIUM: 3.9 mmol/L (ref 3.5–5.1)
Potassium: 3.6 mmol/L (ref 3.5–5.1)
SODIUM: 133 mmol/L — AB (ref 135–145)
SODIUM: 136 mmol/L (ref 135–145)
TOTAL PROTEIN: 7.1 g/dL (ref 6.5–8.1)
Total Protein: 6.6 g/dL (ref 6.5–8.1)

## 2015-05-01 LAB — CBC WITH DIFFERENTIAL/PLATELET
Basophils Absolute: 0 10*3/uL (ref 0.0–0.1)
Basophils Absolute: 0 10*3/uL (ref 0.0–0.1)
Basophils Relative: 0 % (ref 0–1)
Basophils Relative: 0 % (ref 0–1)
EOS ABS: 0.1 10*3/uL (ref 0.0–0.7)
EOS PCT: 1 % (ref 0–5)
EOS PCT: 1 % (ref 0–5)
Eosinophils Absolute: 0.1 10*3/uL (ref 0.0–0.7)
HCT: 38.7 % (ref 36.0–46.0)
HEMATOCRIT: 41 % (ref 36.0–46.0)
HEMOGLOBIN: 13.3 g/dL (ref 12.0–15.0)
Hemoglobin: 13.6 g/dL (ref 12.0–15.0)
LYMPHS ABS: 0.8 10*3/uL (ref 0.7–4.0)
LYMPHS PCT: 6 % — AB (ref 12–46)
LYMPHS PCT: 15 % (ref 12–46)
Lymphs Abs: 1.8 10*3/uL (ref 0.7–4.0)
MCH: 27.5 pg (ref 26.0–34.0)
MCH: 28.1 pg (ref 26.0–34.0)
MCHC: 33.2 g/dL (ref 30.0–36.0)
MCHC: 34.4 g/dL (ref 30.0–36.0)
MCV: 83 fL (ref 78.0–100.0)
MCV: 81.6 fL (ref 78.0–100.0)
MONO ABS: 0.9 10*3/uL (ref 0.1–1.0)
MONOS PCT: 7 % (ref 3–12)
MONOS PCT: 7 % (ref 3–12)
Monocytes Absolute: 0.8 10*3/uL (ref 0.1–1.0)
NEUTROS ABS: 9.4 10*3/uL — AB (ref 1.7–7.7)
Neutro Abs: 10.6 10*3/uL — ABNORMAL HIGH (ref 1.7–7.7)
Neutrophils Relative %: 86 % — ABNORMAL HIGH (ref 43–77)
Neutrophils Relative %: 77 % (ref 43–77)
Platelets: 246 10*3/uL (ref 150–400)
Platelets: 219 10*3/uL (ref 150–400)
RBC: 4.94 MIL/uL (ref 3.87–5.11)
RBC: 4.74 MIL/uL (ref 3.87–5.11)
RDW: 13.5 % (ref 11.5–15.5)
RDW: 13.5 % (ref 11.5–15.5)
WBC: 12.4 10*3/uL — ABNORMAL HIGH (ref 4.0–10.5)
WBC: 12.1 10*3/uL — AB (ref 4.0–10.5)

## 2015-05-01 LAB — LIPASE, BLOOD: LIPASE: 14 U/L — AB (ref 22–51)

## 2015-05-01 LAB — HIV ANTIBODY (ROUTINE TESTING W REFLEX): HIV Screen 4th Generation wRfx: NONREACTIVE

## 2015-05-01 LAB — I-STAT TROPONIN, ED: Troponin i, poc: 0 ng/mL (ref 0.00–0.08)

## 2015-05-01 LAB — BRAIN NATRIURETIC PEPTIDE: B NATRIURETIC PEPTIDE 5: 57.9 pg/mL (ref 0.0–100.0)

## 2015-05-01 LAB — TROPONIN I

## 2015-05-01 LAB — I-STAT CG4 LACTIC ACID, ED: LACTIC ACID, VENOUS: 1.43 mmol/L (ref 0.5–2.0)

## 2015-05-01 MED ORDER — KETOROLAC TROMETHAMINE 30 MG/ML IJ SOLN
30.0000 mg | Freq: Once | INTRAMUSCULAR | Status: AC
  Administered 2015-05-01: 30 mg via INTRAVENOUS
  Filled 2015-05-01: qty 1

## 2015-05-01 MED ORDER — SODIUM CHLORIDE 0.9 % IV BOLUS (SEPSIS)
1000.0000 mL | Freq: Once | INTRAVENOUS | Status: AC
  Administered 2015-05-01: 1000 mL via INTRAVENOUS

## 2015-05-01 MED ORDER — DIAZEPAM 5 MG PO TABS: 5.0000 mg | ORAL_TABLET | Freq: Four times a day (QID) | ORAL | Status: DC | PRN

## 2015-05-01 MED ORDER — FLUTICASONE PROPIONATE 50 MCG/ACT NA SUSP
2.0000 | Freq: Two times a day (BID) | NASAL | Status: DC
  Administered 2015-05-01 – 2015-05-04 (×7): 2 via NASAL
  Filled 2015-05-01: qty 16

## 2015-05-01 MED ORDER — LEVOTHYROXINE SODIUM 75 MCG PO TABS
75.0000 ug | ORAL_TABLET | Freq: Every day | ORAL | Status: DC
  Administered 2015-05-01 – 2015-05-04 (×4): 75 ug via ORAL
  Filled 2015-05-01 (×5): qty 1

## 2015-05-01 MED ORDER — ACETAMINOPHEN 325 MG PO TABS: 650.0000 mg | ORAL_TABLET | Freq: Four times a day (QID) | ORAL | Status: DC | PRN

## 2015-05-01 MED ORDER — PANTOPRAZOLE SODIUM 40 MG PO TBEC
40.0000 mg | DELAYED_RELEASE_TABLET | Freq: Every day | ORAL | Status: DC
  Administered 2015-05-01 – 2015-05-04 (×4): 40 mg via ORAL
  Filled 2015-05-01 (×3): qty 1

## 2015-05-01 MED ORDER — SIMVASTATIN 20 MG PO TABS
20.0000 mg | ORAL_TABLET | Freq: Every day | ORAL | Status: DC
  Administered 2015-05-01 – 2015-05-04 (×4): 20 mg via ORAL
  Filled 2015-05-01 (×4): qty 1

## 2015-05-01 MED ORDER — ONDANSETRON HCL 4 MG/2ML IJ SOLN
4.0000 mg | Freq: Four times a day (QID) | INTRAMUSCULAR | Status: DC | PRN
  Administered 2015-05-01: 4 mg via INTRAVENOUS
  Filled 2015-05-01: qty 2

## 2015-05-01 MED ORDER — ATENOLOL 50 MG PO TABS
50.0000 mg | ORAL_TABLET | Freq: Every day | ORAL | Status: DC
  Administered 2015-05-01 – 2015-05-03 (×3): 50 mg via ORAL
  Filled 2015-05-01 (×4): qty 1

## 2015-05-01 MED ORDER — AZITHROMYCIN 250 MG PO TABS
500.0000 mg | ORAL_TABLET | Freq: Once | ORAL | Status: DC
  Filled 2015-05-01: qty 2

## 2015-05-01 MED ORDER — ADULT MULTIVITAMIN W/MINERALS CH
1.0000 | ORAL_TABLET | Freq: Every day | ORAL | Status: DC
  Administered 2015-05-01 – 2015-05-04 (×4): 1 via ORAL
  Filled 2015-05-01 (×4): qty 1

## 2015-05-01 MED ORDER — DOXYCYCLINE HYCLATE 100 MG PO TABS
100.0000 mg | ORAL_TABLET | Freq: Once | ORAL | Status: AC
  Administered 2015-05-01: 100 mg via ORAL
  Filled 2015-05-01: qty 1

## 2015-05-01 MED ORDER — SODIUM CHLORIDE 0.9 % IV SOLN
INTRAVENOUS | Status: AC
  Administered 2015-05-01 – 2015-05-02 (×2): via INTRAVENOUS

## 2015-05-01 MED ORDER — AMITRIPTYLINE HCL 75 MG PO TABS
75.0000 mg | ORAL_TABLET | Freq: Every day | ORAL | Status: DC
  Administered 2015-05-01 – 2015-05-03 (×3): 75 mg via ORAL
  Filled 2015-05-01 (×4): qty 1

## 2015-05-01 MED ORDER — ONDANSETRON HCL 4 MG PO TABS: 4.0000 mg | ORAL_TABLET | Freq: Four times a day (QID) | ORAL | Status: DC | PRN

## 2015-05-01 MED ORDER — HYDROCODONE-ACETAMINOPHEN 5-325 MG PO TABS
1.0000 | ORAL_TABLET | Freq: Four times a day (QID) | ORAL | Status: DC | PRN
  Administered 2015-05-01 – 2015-05-04 (×5): 1 via ORAL
  Filled 2015-05-01 (×5): qty 1

## 2015-05-01 MED ORDER — ENOXAPARIN SODIUM 40 MG/0.4ML ~~LOC~~ SOLN
40.0000 mg | Freq: Every day | SUBCUTANEOUS | Status: DC
  Administered 2015-05-01 – 2015-05-02 (×2): 40 mg via SUBCUTANEOUS
  Filled 2015-05-01 (×3): qty 0.4

## 2015-05-01 MED ORDER — DEXTROSE 5 % IV SOLN
500.0000 mg | INTRAVENOUS | Status: DC
  Administered 2015-05-01 – 2015-05-03 (×3): 500 mg via INTRAVENOUS
  Filled 2015-05-01 (×4): qty 500

## 2015-05-01 MED ORDER — IOHEXOL 350 MG/ML SOLN
100.0000 mL | Freq: Once | INTRAVENOUS | Status: AC | PRN
  Administered 2015-05-01: 70 mL via INTRAVENOUS

## 2015-05-01 MED ORDER — MORPHINE SULFATE 2 MG/ML IJ SOLN
1.0000 mg | INTRAMUSCULAR | Status: DC | PRN
  Administered 2015-05-01 – 2015-05-02 (×3): 1 mg via INTRAVENOUS
  Filled 2015-05-01 (×3): qty 1

## 2015-05-01 MED ORDER — ACETAMINOPHEN 650 MG RE SUPP: 650.0000 mg | Freq: Four times a day (QID) | RECTAL | Status: DC | PRN

## 2015-05-01 MED ORDER — CEFTRIAXONE SODIUM 2 G IJ SOLR
2.0000 g | Freq: Once | INTRAMUSCULAR | Status: AC
  Administered 2015-05-01: 2 g via INTRAVENOUS
  Filled 2015-05-01: qty 2

## 2015-05-01 MED ORDER — MESALAMINE ER 250 MG PO CPCR
1000.0000 mg | ORAL_CAPSULE | Freq: Three times a day (TID) | ORAL | Status: DC
  Administered 2015-05-01 – 2015-05-04 (×14): 1000 mg via ORAL
  Filled 2015-05-01 (×17): qty 4

## 2015-05-01 MED ORDER — LORATADINE 10 MG PO TABS
10.0000 mg | ORAL_TABLET | Freq: Every day | ORAL | Status: DC
  Administered 2015-05-01 – 2015-05-04 (×4): 10 mg via ORAL
  Filled 2015-05-01 (×4): qty 1

## 2015-05-01 MED ORDER — AMLODIPINE BESYLATE 5 MG PO TABS
5.0000 mg | ORAL_TABLET | Freq: Every day | ORAL | Status: DC
  Administered 2015-05-01 – 2015-05-04 (×4): 5 mg via ORAL
  Filled 2015-05-01 (×4): qty 1

## 2015-05-01 MED ORDER — LEVOFLOXACIN IN D5W 750 MG/150ML IV SOLN: 750.0000 mg | Freq: Once | INTRAVENOUS | Status: DC

## 2015-05-01 MED ORDER — MORPHINE SULFATE 4 MG/ML IJ SOLN
4.0000 mg | Freq: Once | INTRAMUSCULAR | Status: AC
  Administered 2015-05-01: 4 mg via INTRAVENOUS
  Filled 2015-05-01: qty 1

## 2015-05-01 MED ORDER — CEFTRIAXONE SODIUM IN DEXTROSE 20 MG/ML IV SOLN
1.0000 g | INTRAVENOUS | Status: DC
  Administered 2015-05-02 – 2015-05-04 (×3): 1 g via INTRAVENOUS
  Filled 2015-05-01 (×4): qty 50

## 2015-05-01 MED ORDER — ALBUTEROL SULFATE (2.5 MG/3ML) 0.083% IN NEBU: 2.5000 mg | INHALATION_SOLUTION | Freq: Four times a day (QID) | RESPIRATORY_TRACT | Status: DC | PRN

## 2015-05-01 MED ORDER — ESCITALOPRAM OXALATE 10 MG PO TABS
10.0000 mg | ORAL_TABLET | Freq: Every day | ORAL | Status: DC
  Administered 2015-05-01 – 2015-05-04 (×4): 10 mg via ORAL
  Filled 2015-05-01 (×4): qty 1

## 2015-05-01 NOTE — H&P (Signed)
[facility] History and Physical  JERIANN SAYRES OFH:[id number] DOB: [birth year] DOA: 05/01/2015  Referring physician: Dr.Oni. PCP: Marton Redwood, MD  Specialists: Dr.Outlaw.  Chief Complaint: Right shoulder pain and shortness of breath.  HPI: CLARIE CAMEY is a 69 y.o. female with history of hypertension, hyperlipidemia, hypothyroidism, breast cancer status post surgery, Crohn's disease presents with the ER because of right shoulder pain and shortness of breath. Patient has been having cough for last few weeks and had gone to urgent care centers at least 3 times. Last 2 days patient has been experiencing increasing right shoulder pain radiating to her right side of the chest present even at rest and with some shortness of breath. X-rays of the right shoulder shows some proliferative changes and since patient was complaining of shortness of breath CT angiogram of the chest was done which was showing multifocal pneumonia. In the ER patient was mildly febrile with leukocytosis and patient has been started on empiric antibiotics for pneumonia and admitted for further management. Patient is right shoulder pain improved with pain relief medications. Patient denies any trauma or fall. Patient denies any nausea vomiting abdominal pain or diarrhea. Denies any recent travel or sick contacts.   Review of Systems: As presented in the history of presenting illness, rest negative.  Past Medical History  Diagnosis Date  . RUQ pain   . Hypertension   . Thyroid disease   . Overactive bladder   . Migraine   . Sleep apnea   . Fibromyalgia   . Breast cancer    Past Surgical History  Procedure Laterality Date  . Appendectomy    . Simple mastectomy w/ sentinel node biopsy Bilateral 2011   Social History:  reports that she has never smoked. She does not have any smokeless tobacco history on file. She reports that she does not drink alcohol or use illicit drugs. Where does patient live  home. Can patient participate in ADLs? Yes.  Allergies  Allergen Reactions  . Erythromycin Nausea And Vomiting  . Tape Rash  . Codeine   . Neosporin [Neomycin-Bacitracin Zn-Polymyx]   . Penicillins     Family History:  Family History  Problem Relation Age of Onset  . Breast cancer Mother   . CAD Father       Prior to Admission medications   Medication Sig Start Date End Date Taking? Authorizing Provider  albuterol (PROVENTIL HFA;VENTOLIN HFA) 108 (90 BASE) MCG/ACT inhaler Inhale 1-2 puffs into the lungs every 6 (six) hours as needed for wheezing or shortness of breath. 04/19/15  Yes Robyn Haber, MD  amitriptyline (ELAVIL) 75 MG tablet Take 75 mg by mouth at bedtime.   Yes Historical Provider, MD  amLODipine (NORVASC) 5 MG tablet Take 5 mg by mouth daily.   Yes Historical Provider, MD  atenolol (TENORMIN) 50 MG tablet Take 50 mg by mouth at bedtime.   Yes Historical Provider, MD  cetirizine (ZYRTEC) 10 MG tablet Take 10 mg by mouth daily.   Yes Historical Provider, MD  Cholecalciferol 5000 UNITS TABS Take 1 tablet by mouth daily.   Yes Historical Provider, MD  Coenzyme Q10 (CO Q-10) 100 MG CAPS Take 1 tablet by mouth daily.   Yes Historical Provider, MD  diazepam (VALIUM) 5 MG tablet Take 5 mg by mouth every 6 (six) hours as needed for anxiety.   Yes Historical Provider, MD  escitalopram (LEXAPRO) 10 MG tablet Take 10 mg by mouth daily.   Yes Historical Provider, MD  fluticasone (FLONASE) 50  MCG/ACT nasal spray Place 2 sprays into both nostrils 2 (two) times daily.   Yes Historical Provider, MD  HYDROcodone-acetaminophen (NORCO/VICODIN) 5-325 MG per tablet Take 1 tablet by mouth every 6 (six) hours as needed (migraine headache).   Yes Historical Provider, MD  levothyroxine (SYNTHROID, LEVOTHROID) 75 MCG tablet Take 75 mcg by mouth daily before breakfast.   Yes Historical Provider, MD  mesalamine (PENTASA) 500 MG CR capsule Take 1,000 mg by mouth 4 (four) times daily.   Yes  Historical Provider, MD  Multiple Vitamin (MULTIVITAMIN WITH MINERALS) TABS tablet Take 1 tablet by mouth daily.   Yes Historical Provider, MD  omeprazole (PRILOSEC) 20 MG capsule Take 20 mg by mouth 2 (two) times daily before a meal.   Yes Historical Provider, MD  Probiotic Product (PROBIOTIC DAILY PO) Take 1 tablet by mouth daily.   Yes Historical Provider, MD  simvastatin (ZOCOR) 20 MG tablet Take 20 mg by mouth daily.   Yes Historical Provider, MD  ipratropium (ATROVENT) 0.06 % nasal spray Place 2 sprays into both nostrils 4 (four) times daily. For nasal congestion Patient not taking: Reported on 05/01/2015 03/09/14   Audelia Hives Presson, PA  LEVOTHYROXINE SODIUM PO Take by mouth.    Historical Provider, MD    Physical Exam: Filed Vitals:   05/01/15 0505 05/01/15 0538 05/01/15 0540 05/01/15 0600  BP: 119/73  129/53   Pulse: 93  92   Temp:   99.4 F (37.4 C)   TempSrc:   Oral   Resp:   18   Height:  5' 3"  (1.6 m)    Weight:  76.885 kg (169 lb 8 oz)    SpO2:   90% 95%     General:  Well-developed and nourished.  Eyes: Anicteric. No pallor.  ENT: No discharge from the ears eyes nose and mouth.  Neck: No mass felt.  Cardiovascular: S1-S2 heard.  Respiratory: No rhonchi or crepitations.  Abdomen: Soft nontender bowel sounds present.  Skin: No rash.  Musculoskeletal: No edema.  Psychiatric: Appears normal.  Neurologic: Alert awake oriented to time place and person. Moves all extremities.  Labs on Admission:  Basic Metabolic Panel:  Recent Labs Lab 05/01/15 0137  NA 133*  K 3.6  CL 97*  CO2 25  GLUCOSE 121*  BUN 19  CREATININE 0.97  CALCIUM 8.8*   Liver Function Tests:  Recent Labs Lab 05/01/15 0137  AST 37  ALT 88*  ALKPHOS 112  BILITOT 1.2  PROT 6.6  ALBUMIN 3.9    Recent Labs Lab 05/01/15 0137  LIPASE 14*   No results for input(s): AMMONIA in the last 168 hours. CBC:  Recent Labs Lab 05/01/15 0137  WBC 12.4*  NEUTROABS 10.6*   HGB 13.6  HCT 41.0  MCV 83.0  PLT 246   Cardiac Enzymes: No results for input(s): CKTOTAL, CKMB, CKMBINDEX, TROPONINI in the last 168 hours.  BNP (last 3 results) No results for input(s): BNP in the last 8760 hours.  ProBNP (last 3 results) No results for input(s): PROBNP in the last 8760 hours.  CBG: No results for input(s): GLUCAP in the last 168 hours.  Radiological Exams on Admission: Dg Shoulder Right  05/01/2015   CLINICAL DATA:  Right shoulder pain.  EXAM: RIGHT SHOULDER - 2+ VIEW  COMPARISON:  None.  FINDINGS: No fracture or dislocation. The alignment and joint spaces are maintained. No evident focal lesion. Mild proliferative change at the acromioclavicular joint. No abnormal soft tissue calcifications.  IMPRESSION: Mild  proliferative change at the acromioclavicular joint. No acute bony abnormality.   Electronically Signed   By: Jeb Levering M.D.   On: 05/01/2015 01:33   Ct Angio Chest Pe W/cm &/or Wo Cm  05/01/2015   CLINICAL DATA:  Shortness of breath. Right shoulder pain. Chest pain.  EXAM: CT ANGIOGRAPHY CHEST WITH CONTRAST  TECHNIQUE: Multidetector CT imaging of the chest was performed using the standard protocol during bolus administration of intravenous contrast. Multiplanar CT image reconstructions and MIPs were obtained to evaluate the vascular anatomy.  CONTRAST:  28m OMNIPAQUE IOHEXOL 350 MG/ML SOLN  COMPARISON:  Radiographs 04/19/2015.  Chest CT 01/01/2013  FINDINGS: There are no filling defects within the central pulmonary arteries to suggest pulmonary embolus. Distal branches are suboptimally assessed related to body habitus and contrast bolus timing.  The thoracic aorta is normal in caliber with moderate atherosclerosis. No dissection. Heart is at the upper limits of normal in size. Calcified left hilar lymph nodes consistent prior granulomatous disease. No noncalcified mediastinal or hilar adenopathy. No pleural or pericardial effusion. The previously questioned  left thyroid nodule is not well seen.  Lung volumes are low. Consolidations within the right lower lobe, right middle lobe, and left lower lobe. Faint ground-glass opacity at the right lung apex. The small pulmonary nodules on prior exam are obscured. Calcified granuloma in the left lower lobe.  There are multiple cysts throughout the liver, largest in the right hepatic lobe measures 15 cm. These have increased in size compared to prior exam. There is background hepatic steatosis and hepatomegaly. Bilateral breast implants noted. There is no axillary adenopathy.  There are no acute or suspicious osseous abnormalities. Small Schmorl's nodes at the thoracolumbar junction. Right ribs are intact.  Review of the MIP images confirms the above findings.  IMPRESSION: 1. No central pulmonary embolus. 2. Consolidations within the right lower lobe, right middle lobe, and left lower lobe, dependently. This may reflect atelectasis, pneumonia, or aspiration. 3. Low lung volumes with elevation of right hemidiaphragm. Multiple hepatic cysts have increased in size from prior exam, a large cyst in the right hepatic lobe measuring 15 cm. There is background hepatomegaly and severe hepatic steatosis, partially included.   Electronically Signed   By: MJeb LeveringM.D.   On: 05/01/2015 04:02    EKG: Independently reviewed. Normal sinus rhythm. T-wave changes in the anterior leads.  Assessment/Plan Principal Problem:   CAP (community acquired pneumonia) Active Problems:   Hypothyroidism   Essential hypertension   Crohn's disease   Hyperlipidemia   Pneumonia   1. Community-acquired pneumonia - CT scan shows multifocal pneumonia for which I have placed patient on ceftriaxone and Zithromax for community-acquired pneumonia. Check blood cultures HIV status, urine Legionella and strep antigen and influenza PCR. Check also BNP levels. 2. Right shoulder pain - improved pain relief medications. She has proliferative changes in  the right shoulder x-ray. If pain persists may need further imaging. 3. Hypertension - continue home medications. 4. Hyperlipidemia on statins. 5. Crohn's disease continue present medication denies any diarrhea or abdominal pain. 6. Hypothyroidism on Synthroid.   DVT Prophylaxis Lovenox.  Code Status: Full code.  Family Communication: Discussed with patient.  Disposition Plan: Admit to inpatient. Likely stay 2-3 days.    Chaylee Ehrsam N. [facility] Pager [phone]  If 7PM-7AM, please contact night-coverage [email or web address] Password [email or web address] 05/01/2015, 7:04 AM

## 2015-05-01 NOTE — Progress Notes (Signed)
[facility]  Kim Chavez is a 69 year old female with a past medical history of hypertension, hyperlipidemia, hypothyroidism, breast cancer, Crohn's disease who has been admitted to the hospital for multifocal pneumonia by my partner this morning. I evaluated her earlier today. She states that her cough is improving. No complaints of shortness of breath or dyspnea which were present yesterday. On exam there are crackles in the right lower lobe-no wheezing no rhonchi  [employer]- regular rate and rhythm no murmurs rubs or gallops Abdomen- soft nontender nondistended bowel sounds positive Extremities/musculoskeletal-no cyanosis clubbing or edema  Principal Problem:   CAP (community acquired pneumonia)/acute hypoxic respiratory failure -She has had symptoms of a cough for 4-6 weeks-she states she was treated with doxycycline and prednisone which was prescribed by urgent care a few weeks ago -Continue ceftriaxone and azithromycin-follow-up on Legionella/strep/influenza studies -And tinea oxygen as needed for hypoxia-early requiring 2 L of O2  Active Problems:   Hypothyroidism -Continue levothyroxine    Essential hypertension - Continue atenolol    Crohn's disease - Continue mesalamine capsules  Debbe Odea, MD Pager: Shea Evans. Com- After 7PM contact night coverage also on [email or web address]

## 2015-05-01 NOTE — ED Provider Notes (Signed)
CSN: [id number]     Arrival date & time 04/30/15  2344 History  This chart was scribed for Everlene Balls, MD by Julien Nordmann, ED Scribe. This patient was seen in room A02C/A02C and the patient's care was started at 1:05 AM.    Chief Complaint  Patient presents with  . Shoulder Pain   The history is provided by the patient. No language interpreter was used.     HPI Comments: Kim Chavez is a 69 y.o. female who presents to the Emergency Department complaining of constant, gradual worsening, right shoulder pain that radiates to her ribs and back onset 2 days ago. Patient notes when she breathes deeply, the pain worsens. She states she used a heating pad and took some muscle relaxer with no relief. Pt has prior surgical history tubal ligation and double mastectomy. Patient also notes that her gallbladder does not work correctly. Pt reports she has had bronchitis for the past 3 weeks. She denies history of PE/DVT. She also denies any injury, chest pain, nausea, vomiting, pain and swelling in legs.  Past Medical History  Diagnosis Date  . RUQ pain   . Hypertension   . Thyroid disease   . Overactive bladder   . Migraine   . Sleep apnea   . Fibromyalgia   . Breast cancer    Past Surgical History  Procedure Laterality Date  . Appendectomy    . Simple mastectomy w/ sentinel node biopsy Bilateral 2011   No family history on file. History  Substance Use Topics  . Smoking status: Never Smoker   . Smokeless tobacco: Not on file  . Alcohol Use: No   OB History    No data available     Review of Systems  A complete 10 system review of systems was obtained and all systems are negative except as noted in the HPI and PMH.    Allergies  Codeine; Neosporin; and Penicillins  Home Medications   Prior to Admission medications   Medication Sig Start Date End Date Taking? Authorizing Provider  albuterol (PROVENTIL HFA;VENTOLIN HFA) 108 (90 BASE) MCG/ACT inhaler Inhale 1-2 puffs into  the lungs every 6 (six) hours as needed for wheezing or shortness of breath. 04/19/15   Robyn Haber, MD  AMITRIPTYLINE HCL PO Take by mouth.    Historical Provider, MD  AMLODIPINE BESYLATE PO Take by mouth.    Historical Provider, MD  Citalopram Hydrobromide (CELEXA PO) Take by mouth.    Historical Provider, MD  diazepam (VALIUM) 5 MG tablet Take 5 mg by mouth every 6 (six) hours as needed for anxiety.    Historical Provider, MD  fexofenadine (ALLEGRA) 180 MG tablet Take 180 mg by mouth daily.    Historical Provider, MD  fluticasone (FLONASE) 50 MCG/ACT nasal spray Place into both nostrils daily.    Historical Provider, MD  ipratropium (ATROVENT) 0.06 % nasal spray Place 2 sprays into both nostrils 4 (four) times daily. For nasal congestion 03/09/14   Audelia Hives Presson, PA  LEVOTHYROXINE SODIUM PO Take by mouth.    Historical Provider, MD  Mesalamine (PENTASA PO) Take by mouth.    Historical Provider, MD  OXYBUTYNIN CHLORIDE PO Take by mouth.    Historical Provider, MD  SIMVASTATIN PO Take by mouth.    Historical Provider, MD   Triage vitals: BP 121/60 mmHg  Pulse 88  Temp(Src) 99 F (37.2 C) (Oral)  Resp 14  Ht 5' 3"  (1.6 m)  Wt 173 lb (78.472 kg)  BMI 30.65 kg/m2  SpO2 100%  Physical Exam  Constitutional: She is oriented to person, place, and time. She appears well-developed and well-nourished. No distress.  HENT:  Head: Normocephalic and atraumatic.  Nose: Nose normal.  Mouth/Throat: Oropharynx is clear and moist. No oropharyngeal exudate.  Eyes: Conjunctivae and EOM are normal. Pupils are equal, round, and reactive to light. No scleral icterus.  Neck: Normal range of motion. Neck supple. No JVD present. No tracheal deviation present. No thyromegaly present.  Cardiovascular: Normal rate, regular rhythm and normal heart sounds.  Exam reveals no gallop and no friction rub.   No murmur heard. Pulmonary/Chest: Effort normal. No respiratory distress. She has no wheezes. She  exhibits no tenderness.  Crackles heard in right posterior lung  Abdominal: Soft. Bowel sounds are normal. She exhibits no distension and no mass. There is no tenderness. There is no rebound, no guarding and negative Murphy's sign.  Negative Murphy's sign  Musculoskeletal: Normal range of motion. She exhibits no edema or tenderness.  Lymphadenopathy:    She has no cervical adenopathy.  Neurological: She is alert and oriented to person, place, and time. No cranial nerve deficit. She exhibits normal muscle tone.  Skin: Skin is warm and dry. No rash noted. No erythema. No pallor.  Nursing note and vitals reviewed.   ED Course  Procedures   DIAGNOSTIC STUDIES: Oxygen Saturation is 100% on RA, normal by my interpretation.  COORDINATION OF CARE:   1:11 AM Discussed treatment plan which includes blood tests with pt at bedside and pt agreed to plan.   Labs Review Labs Reviewed  CBC WITH DIFFERENTIAL/PLATELET - Abnormal; Notable for the following:    WBC 12.4 (*)    Neutrophils Relative % 86 (*)    Neutro Abs 10.6 (*)    Lymphocytes Relative 6 (*)    All other components within normal limits  COMPREHENSIVE METABOLIC PANEL - Abnormal; Notable for the following:    Sodium 133 (*)    Chloride 97 (*)    Glucose, Bld 121 (*)    Calcium 8.8 (*)    ALT 88 (*)    GFR calc non Af Amer 58 (*)    All other components within normal limits  LIPASE, BLOOD - Abnormal; Notable for the following:    Lipase 14 (*)    All other components within normal limits  CULTURE, BLOOD (ROUTINE X 2)  CULTURE, BLOOD (ROUTINE X 2)  I-STAT CG4 LACTIC ACID, ED  Randolm Idol, ED    Imaging Review Dg Shoulder Right  05/01/2015   CLINICAL DATA:  Right shoulder pain.  EXAM: RIGHT SHOULDER - 2+ VIEW  COMPARISON:  None.  FINDINGS: No fracture or dislocation. The alignment and joint spaces are maintained. No evident focal lesion. Mild proliferative change at the acromioclavicular joint. No abnormal soft tissue  calcifications.  IMPRESSION: Mild proliferative change at the acromioclavicular joint. No acute bony abnormality.   Electronically Signed   By: Jeb Levering M.D.   On: 05/01/2015 01:33   Ct Angio Chest Pe W/cm &/or Wo Cm  05/01/2015   CLINICAL DATA:  Shortness of breath. Right shoulder pain. Chest pain.  EXAM: CT ANGIOGRAPHY CHEST WITH CONTRAST  TECHNIQUE: Multidetector CT imaging of the chest was performed using the standard protocol during bolus administration of intravenous contrast. Multiplanar CT image reconstructions and MIPs were obtained to evaluate the vascular anatomy.  CONTRAST:  54m OMNIPAQUE IOHEXOL 350 MG/ML SOLN  COMPARISON:  Radiographs 04/19/2015.  Chest CT 01/01/2013  FINDINGS:  There are no filling defects within the central pulmonary arteries to suggest pulmonary embolus. Distal branches are suboptimally assessed related to body habitus and contrast bolus timing.  The thoracic aorta is normal in caliber with moderate atherosclerosis. No dissection. Heart is at the upper limits of normal in size. Calcified left hilar lymph nodes consistent prior granulomatous disease. No noncalcified mediastinal or hilar adenopathy. No pleural or pericardial effusion. The previously questioned left thyroid nodule is not well seen.  Lung volumes are low. Consolidations within the right lower lobe, right middle lobe, and left lower lobe. Faint ground-glass opacity at the right lung apex. The small pulmonary nodules on prior exam are obscured. Calcified granuloma in the left lower lobe.  There are multiple cysts throughout the liver, largest in the right hepatic lobe measures 15 cm. These have increased in size compared to prior exam. There is background hepatic steatosis and hepatomegaly. Bilateral breast implants noted. There is no axillary adenopathy.  There are no acute or suspicious osseous abnormalities. Small Schmorl's nodes at the thoracolumbar junction. Right ribs are intact.  Review of the MIP images  confirms the above findings.  IMPRESSION: 1. No central pulmonary embolus. 2. Consolidations within the right lower lobe, right middle lobe, and left lower lobe, dependently. This may reflect atelectasis, pneumonia, or aspiration. 3. Low lung volumes with elevation of right hemidiaphragm. Multiple hepatic cysts have increased in size from prior exam, a large cyst in the right hepatic lobe measuring 15 cm. There is background hepatomegaly and severe hepatic steatosis, partially included.   Electronically Signed   By: Jeb Levering M.D.   On: 05/01/2015 04:02     EKG Interpretation   Date/Time:  Thursday May 01 2015 01:30:41 EDT Ventricular Rate:  87 PR Interval:  176 QRS Duration: 84 QT Interval:  338 QTC Calculation: 406 R Axis:   59 Text Interpretation:  Normal sinus rhythm T wave inversion in V2-V3  Abnormal ECG Confirmed by Glynn Octave [phone]) on 05/01/2015  1:42:43 AM      MDM   Final diagnoses:  None   Patient presents to the emergency department for right shoulder pain. She states the pain is in her shoulder but patient also points to her right flank and right upper quadrant. Percell Miller sign is negative on exam. Patient has remote history of breast cancer, I have concern for possible pulmonary embolism. Patient is also tachycardic on initial exam. Will obtain CTA of the chest for evaluation. Also obtain LFTs to evaluate for gallbladder pathology, will obtain right upper quadrant ultrasound if indicated. Patient was given 1 L of IV fluids, morphine, Toradol for pain. Patient states her pain has greatly improved upon repeat evaluation. CT scan reveals pneumonia of the right lower lobe, right middle lobe, and left lower lobe. Patient was treated for continued quite pneumonia, blood cultures were drawn, she was given ceftriaxone and doxycycline, as she has an allergy to azithromycin. Patient is admitted to try hospitalist for continued management for hypoxic pneumonia. She is  currently in the high 80s on room air, she was placed on nasal cannula.  I personally performed the services described in this documentation, which was scribed in my presence. The recorded information has been reviewed and is accurate.   Everlene Balls, MD 05/01/15 [phone]

## 2015-05-01 NOTE — ED Notes (Signed)
Pt. reports right shoulder pain radiating to right side of back onset yesterday , denies injury , pain increases with movement . Respirations unlabored .

## 2015-05-01 NOTE — Progress Notes (Signed)
Utilization review completed.  

## 2015-05-02 DIAGNOSIS — E038 Other specified hypothyroidism: Secondary | ICD-10-CM

## 2015-05-02 LAB — BASIC METABOLIC PANEL
ANION GAP: 6 (ref 5–15)
BUN: 9 mg/dL (ref 6–20)
CHLORIDE: 108 mmol/L (ref 101–111)
CO2: 25 mmol/L (ref 22–32)
Calcium: 8.5 mg/dL — ABNORMAL LOW (ref 8.9–10.3)
Creatinine, Ser: 0.92 mg/dL (ref 0.44–1.00)
GFR calc Af Amer: >60 mL/min (ref >60)
Glucose, Bld: 96 mg/dL (ref 65–99)
POTASSIUM: 4 mmol/L (ref 3.5–5.1)
Sodium: 139 mmol/L (ref 135–145)

## 2015-05-02 LAB — CBC
HEMATOCRIT: 34.7 % — AB (ref 36.0–46.0)
HEMOGLOBIN: 11.6 g/dL — AB (ref 12.0–15.0)
MCH: 27.8 pg (ref 26.0–34.0)
MCHC: 33.4 g/dL (ref 30.0–36.0)
MCV: 83.2 fL (ref 78.0–100.0)
Platelets: 223 10*3/uL (ref 150–400)
RBC: 4.17 MIL/uL (ref 3.87–5.11)
RDW: 13.7 % (ref 11.5–15.5)
WBC: 6.3 10*3/uL (ref 4.0–10.5)

## 2015-05-02 LAB — STREP PNEUMONIAE URINARY ANTIGEN: Strep Pneumo Urinary Antigen: NEGATIVE

## 2015-05-02 NOTE — Plan of Care (Signed)
Problem: Phase I Progression Outcomes Goal: Initial discharge plan identified Outcome: Completed/Met Date Met:  05/02/15 Home With husband

## 2015-05-02 NOTE — Progress Notes (Signed)
[facility] Progress Note   [name]:[id number] DOB: March 28, 1946 DOA: 05/01/2015 PCP: Marton Redwood, MD  Brief narrative: Kim Chavez is a 69 y.o. female Kim Chavez is a 69 year old female with a past medical history of hypertension, hyperlipidemia, hypothyroidism, breast cancer, Crohn's disease who has been admitted to the hospital for multifocal pneumonia.   Subjective: Continues to be short of breath with a small amount of exertion. Cough is improving. Has pain throughout her upper back and chest which is worse when she coughs.  Assessment/Plan: Principal Problem:  CAP (community acquired pneumonia)/acute hypoxic respiratory failure -She has had symptoms of a cough for 4-6 weeks-she states she was treated with doxycycline and prednisone which was prescribed by urgent care a few weeks ago -Continue ceftriaxone and azithromycin-strep pneumonia urinary antigen negative-Legionella pending -And tinea oxygen as needed for hypoxia-early requiring 2 L of O2  Active Problems:  Hypothyroidism -Continue levothyroxine   Essential hypertension - Continue atenolol   Crohn's disease - Continue mesalamine capsules   Plan discussed with patient in detail- all studies including lab results reviewed with patient Code Status: Full code Disposition Plan: Home when stable DVT prophylaxis: Lovenox Consultants: Procedures:  Antibiotics: Anti-infectives    Start     Dose/Rate Route Frequency Ordered Stop   05/02/15 0800  cefTRIAXone (ROCEPHIN) 1 g in dextrose 5 % 50 mL IVPB - Premix     1 g 100 mL/hr over 30 Minutes Intravenous Every 24 hours 05/01/15 0704 05/09/15 0759   05/01/15 0800  azithromycin (ZITHROMAX) 500 mg in dextrose 5 % 250 mL IVPB     500 mg 250 mL/hr over 60 Minutes Intravenous Every 24 hours 05/01/15 0704 05/08/15 0759   05/01/15 0515  doxycycline (VIBRA-TABS) tablet 100 mg     100 mg Oral  Once 05/01/15 0500 05/01/15 0742   05/01/15 0500   levofloxacin (LEVAQUIN) IVPB 750 mg  Status:  Discontinued     750 mg 100 mL/hr over 90 Minutes Intravenous  Once 05/01/15 0459 05/01/15 0500   05/01/15 0430  cefTRIAXone (ROCEPHIN) 2 g in dextrose 5 % 50 mL IVPB     2 g 100 mL/hr over 30 Minutes Intravenous  Once 05/01/15 0415 05/01/15 0526   05/01/15 0430  azithromycin (ZITHROMAX) tablet 500 mg  Status:  Discontinued     500 mg Oral  Once 05/01/15 0415 05/01/15 0459      Objective: Filed Weights   05/01/15 0007 05/01/15 0538  Weight: 78.472 kg (173 lb) 76.885 kg (169 lb 8 oz)    Intake/Output Summary (Last 24 hours) at 05/02/15 1457 Last data filed at 05/02/15 1414  Gross per 24 hour  Intake    222 ml  Output   2000 ml  Net  -1778 ml     Vitals Filed Vitals:   05/01/15 2218 05/02/15 0544 05/02/15 1054 05/02/15 1337  BP: 134/77 111/76 101/61 105/49  Pulse: 94 61  72  Temp: 98.8 F (37.1 C) 97.8 F (36.6 C)  98.2 F (36.8 C)  TempSrc: Oral Oral  Oral  Resp: 18 18  16   Height:      Weight:      SpO2: 90% 95%  93%    Exam:  General:  Pt is alert, not in acute distress  HEENT: No icterus, No thrush  Cardiovascular: regular rate and rhythm, S1/S2 No murmur  Respiratory: clear to auscultation bilaterally   Abdomen: Soft, +Bowel sounds, non tender, non distended, no guarding  MSK: No LE edema,  cyanosis or clubbing  Data Reviewed: Basic Metabolic Panel:  Recent Labs Lab 05/01/15 0137 05/01/15 0835 05/02/15 0431  NA 133* 136 139  K 3.6 3.9 4.0  CL 97* 102 108  CO2 25 24 25   GLUCOSE 121* 108* 96  BUN 19 12 9   CREATININE 0.97 0.79 0.92  CALCIUM 8.8* 8.8* 8.5*   Liver Function Tests:  Recent Labs Lab 05/01/15 0137 05/01/15 0835  AST 37 33  ALT 88* 80*  ALKPHOS 112 107  BILITOT 1.2 0.9  PROT 6.6 7.1  ALBUMIN 3.9 3.7    Recent Labs Lab 05/01/15 0137  LIPASE 14*   No results for input(s): AMMONIA in the last 168 hours. CBC:  Recent Labs Lab 05/01/15 0137 05/01/15 0835 05/02/15 0431   WBC 12.4* 12.1* 6.3  NEUTROABS 10.6* 9.4*  --   HGB 13.6 13.3 11.6*  HCT 41.0 38.7 34.7*  MCV 83.0 81.6 83.2  PLT 246 219 223   Cardiac Enzymes:  Recent Labs Lab 05/01/15 0835  TROPONINI <0.03   BNP (last 3 results)  Recent Labs  05/01/15 0835  BNP 57.9    ProBNP (last 3 results) No results for input(s): PROBNP in the last 8760 hours.  CBG: No results for input(s): GLUCAP in the last 168 hours.  Recent Results (from the past 240 hour(s))  Culture, blood (routine x 2)     Status: None (Preliminary result)   Collection Time: 05/01/15  4:37 AM  Result Value Ref Range Status   Specimen Description BLOOD RIGHT ARM  Final   Special Requests BOTTLES DRAWN AEROBIC AND ANAEROBIC 5CC  Final   Culture   Final           BLOOD CULTURE RECEIVED NO GROWTH TO DATE CULTURE WILL BE HELD FOR 5 DAYS BEFORE ISSUING A FINAL NEGATIVE REPORT Performed at [facility]    Report Status PENDING  Incomplete  Culture, blood (routine x 2)     Status: None (Preliminary result)   Collection Time: 05/01/15  4:41 AM  Result Value Ref Range Status   Specimen Description BLOOD RIGHT HAND  Final   Special Requests BOTTLES DRAWN AEROBIC AND ANAEROBIC 5CC  Final   Culture   Final           BLOOD CULTURE RECEIVED NO GROWTH TO DATE CULTURE WILL BE HELD FOR 5 DAYS BEFORE ISSUING A FINAL NEGATIVE REPORT Performed at [facility]    Report Status PENDING  Incomplete     Studies: Dg Shoulder Right  05/01/2015   CLINICAL DATA:  Right shoulder pain.  EXAM: RIGHT SHOULDER - 2+ VIEW  COMPARISON:  None.  FINDINGS: No fracture or dislocation. The alignment and joint spaces are maintained. No evident focal lesion. Mild proliferative change at the acromioclavicular joint. No abnormal soft tissue calcifications.  IMPRESSION: Mild proliferative change at the acromioclavicular joint. No acute bony abnormality.   Electronically Signed   By: Jeb Levering M.D.   On: 05/01/2015 01:33   Ct Angio  Chest Pe W/cm &/or Wo Cm  05/01/2015   CLINICAL DATA:  Shortness of breath. Right shoulder pain. Chest pain.  EXAM: CT ANGIOGRAPHY CHEST WITH CONTRAST  TECHNIQUE: Multidetector CT imaging of the chest was performed using the standard protocol during bolus administration of intravenous contrast. Multiplanar CT image reconstructions and MIPs were obtained to evaluate the vascular anatomy.  CONTRAST:  42m OMNIPAQUE IOHEXOL 350 MG/ML SOLN  COMPARISON:  Radiographs 04/19/2015.  Chest CT 01/01/2013  FINDINGS: There are no  filling defects within the central pulmonary arteries to suggest pulmonary embolus. Distal branches are suboptimally assessed related to body habitus and contrast bolus timing.  The thoracic aorta is normal in caliber with moderate atherosclerosis. No dissection. Heart is at the upper limits of normal in size. Calcified left hilar lymph nodes consistent prior granulomatous disease. No noncalcified mediastinal or hilar adenopathy. No pleural or pericardial effusion. The previously questioned left thyroid nodule is not well seen.  Lung volumes are low. Consolidations within the right lower lobe, right middle lobe, and left lower lobe. Faint ground-glass opacity at the right lung apex. The small pulmonary nodules on prior exam are obscured. Calcified granuloma in the left lower lobe.  There are multiple cysts throughout the liver, largest in the right hepatic lobe measures 15 cm. These have increased in size compared to prior exam. There is background hepatic steatosis and hepatomegaly. Bilateral breast implants noted. There is no axillary adenopathy.  There are no acute or suspicious osseous abnormalities. Small Schmorl's nodes at the thoracolumbar junction. Right ribs are intact.  Review of the MIP images confirms the above findings.  IMPRESSION: 1. No central pulmonary embolus. 2. Consolidations within the right lower lobe, right middle lobe, and left lower lobe, dependently. This may reflect  atelectasis, pneumonia, or aspiration. 3. Low lung volumes with elevation of right hemidiaphragm. Multiple hepatic cysts have increased in size from prior exam, a large cyst in the right hepatic lobe measuring 15 cm. There is background hepatomegaly and severe hepatic steatosis, partially included.   Electronically Signed   By: Jeb Levering M.D.   On: 05/01/2015 04:02    Scheduled Meds:  Scheduled Meds: . amitriptyline  75 mg Oral QHS  . amLODipine  5 mg Oral Daily  . atenolol  50 mg Oral QHS  . azithromycin  500 mg Intravenous Q24H  . cefTRIAXone (ROCEPHIN)  IV  1 g Intravenous Q24H  . enoxaparin (LOVENOX) injection  40 mg Subcutaneous Daily  . escitalopram  10 mg Oral Daily  . fluticasone  2 spray Each Nare BID  . levothyroxine  75 mcg Oral QAC breakfast  . loratadine  10 mg Oral Daily  . mesalamine  1,000 mg Oral TID AC & HS  . multivitamin with minerals  1 tablet Oral Daily  . pantoprazole  40 mg Oral Daily  . simvastatin  20 mg Oral Daily   Continuous Infusions:   Time spent on care of this patient: 35 min   [place], MD 05/02/2015, 2:57 PM  LOS: 1 day   [facility] Office  [phone] Pager - Text Page per [email or web address]  If 7PM-7AM, please contact night-coverage [email or web address]

## 2015-05-03 DIAGNOSIS — J9601 Acute respiratory failure with hypoxia: Secondary | ICD-10-CM

## 2015-05-03 LAB — INFLUENZA PANEL BY PCR (TYPE A & B)
H1N1FLUPCR: NOT DETECTED
INFLAPCR: NEGATIVE
Influenza B By PCR: NEGATIVE

## 2015-05-03 MED ORDER — AZITHROMYCIN 500 MG PO TABS
500.0000 mg | ORAL_TABLET | Freq: Every day | ORAL | Status: DC
  Administered 2015-05-04: 500 mg via ORAL
  Filled 2015-05-03: qty 1

## 2015-05-03 MED ORDER — ENOXAPARIN SODIUM 40 MG/0.4ML ~~LOC~~ SOLN
40.0000 mg | SUBCUTANEOUS | Status: DC
  Administered 2015-05-03 – 2015-05-04 (×2): 40 mg via SUBCUTANEOUS
  Filled 2015-05-03 (×3): qty 0.4

## 2015-05-03 MED ORDER — POLYETHYLENE GLYCOL 3350 17 G PO PACK
17.0000 g | PACK | Freq: Every day | ORAL | Status: DC | PRN
  Filled 2015-05-03: qty 1

## 2015-05-03 NOTE — Progress Notes (Signed)
[facility] Progress Note   JUPITER BOYS NGE:[id number] DOB: Jun 20, 1946 DOA: 05/01/2015 PCP: Marton Redwood, MD  Brief narrative: Kim Chavez is a 69 y.o. female Kim Chavez is a 69 year old female with a past medical history of hypertension, hyperlipidemia, hypothyroidism, breast cancer, Crohn's disease who has been admitted to the hospital for multifocal pneumonia.   Subjective: Shortness of breath is improving-was able to walk around in her room yesterday but did get short of breath sooner than usual. She no longer has a cough. Chest pain is improving as well. No other complaints including no nausea vomiting diarrhea. She does mention she hasn't had a bowel movement in 3 days and therefore I have ordered when necessary Kim Chavez ask  Assessment/Plan: Principal Problem:  CAP (community acquired pneumonia)/acute hypoxic respiratory failure -She has had symptoms of a cough for 4-6 weeks-she states she was treated with doxycycline and prednisone which was prescribed by urgent care a few weeks ago -Continue ceftriaxone and azithromycin-strep pneumonia urinary antigen negative-Legionella still pending -Continue oxygen as needed -have asked RN to attempt to wean  Active Problems:  Hypothyroidism -Continue levothyroxine   Essential hypertension - Continue atenolol   Crohn's disease - Continue mesalamine capsules   Plan discussed with patient in detail- all studies including lab results reviewed with patient Code Status: Full code Disposition Plan: Home when stable DVT prophylaxis: Lovenox Consultants: Procedures:  Antibiotics: Anti-infectives    Start     Dose/Rate Route Frequency Ordered Stop   05/02/15 0800  cefTRIAXone (ROCEPHIN) 1 g in dextrose 5 % 50 mL IVPB - Premix     1 g 100 mL/hr over 30 Minutes Intravenous Every 24 hours 05/01/15 0704 05/09/15 0759   05/01/15 0800  azithromycin (ZITHROMAX) 500 mg in dextrose 5 % 250 mL IVPB     500 mg 250 mL/hr  over 60 Minutes Intravenous Every 24 hours 05/01/15 0704 05/08/15 0759   05/01/15 0515  doxycycline (VIBRA-TABS) tablet 100 mg     100 mg Oral  Once 05/01/15 0500 05/01/15 0742   05/01/15 0500  levofloxacin (LEVAQUIN) IVPB 750 mg  Status:  Discontinued     750 mg 100 mL/hr over 90 Minutes Intravenous  Once 05/01/15 0459 05/01/15 0500   05/01/15 0430  cefTRIAXone (ROCEPHIN) 2 g in dextrose 5 % 50 mL IVPB     2 g 100 mL/hr over 30 Minutes Intravenous  Once 05/01/15 0415 05/01/15 0526   05/01/15 0430  azithromycin (ZITHROMAX) tablet 500 mg  Status:  Discontinued     500 mg Oral  Once 05/01/15 0415 05/01/15 0459      Objective: Filed Weights   05/01/15 0007 05/01/15 0538  Weight: 78.472 kg (173 lb) 76.885 kg (169 lb 8 oz)    Intake/Output Summary (Last 24 hours) at 05/03/15 1157 Last data filed at 05/03/15 0841  Gross per 24 hour  Intake    640 ml  Output   2450 ml  Net  -1810 ml     Vitals Filed Vitals:   05/02/15 1935 05/02/15 2140 05/03/15 0632 05/03/15 0941  BP:  115/56 142/78 134/56  Pulse: 80 73 57   Temp:  98.6 F (37 C) 98.4 F (36.9 C)   TempSrc:  Oral Oral   Resp:  16 19   Height:      Weight:      SpO2: 77% 92% 94%     Exam:  General:  Pt is alert, not in acute distress  HEENT: No icterus, No thrush  Cardiovascular: regular rate and rhythm, S1/S2 No murmur  Respiratory: clear to auscultation bilaterally   Abdomen: Soft, +Bowel sounds, non tender, non distended, no guarding  MSK: No LE edema, cyanosis or clubbing  Data Reviewed: Basic Metabolic Panel:  Recent Labs Lab 05/01/15 0137 05/01/15 0835 05/02/15 0431  NA 133* 136 139  K 3.6 3.9 4.0  CL 97* 102 108  CO2 25 24 25   GLUCOSE 121* 108* 96  BUN 19 12 9   CREATININE 0.97 0.79 0.92  CALCIUM 8.8* 8.8* 8.5*   Liver Function Tests:  Recent Labs Lab 05/01/15 0137 05/01/15 0835  AST 37 33  ALT 88* 80*  ALKPHOS 112 107  BILITOT 1.2 0.9  PROT 6.6 7.1  ALBUMIN 3.9 3.7    Recent  Labs Lab 05/01/15 0137  LIPASE 14*   No results for input(s): AMMONIA in the last 168 hours. CBC:  Recent Labs Lab 05/01/15 0137 05/01/15 0835 05/02/15 0431  WBC 12.4* 12.1* 6.3  NEUTROABS 10.6* 9.4*  --   HGB 13.6 13.3 11.6*  HCT 41.0 38.7 34.7*  MCV 83.0 81.6 83.2  PLT 246 219 223   Cardiac Enzymes:  Recent Labs Lab 05/01/15 0835  TROPONINI <0.03   BNP (last 3 results)  Recent Labs  05/01/15 0835  BNP 57.9    ProBNP (last 3 results) No results for input(s): PROBNP in the last 8760 hours.  CBG: No results for input(s): GLUCAP in the last 168 hours.  Recent Results (from the past 240 hour(s))  Culture, blood (routine x 2)     Status: None (Preliminary result)   Collection Time: 05/01/15  4:37 AM  Result Value Ref Range Status   Specimen Description BLOOD RIGHT ARM  Final   Special Requests BOTTLES DRAWN AEROBIC AND ANAEROBIC 5CC  Final   Culture   Final           BLOOD CULTURE RECEIVED NO GROWTH TO DATE CULTURE WILL BE HELD FOR 5 DAYS BEFORE ISSUING A FINAL NEGATIVE REPORT Performed at [facility]    Report Status PENDING  Incomplete  Culture, blood (routine x 2)     Status: None (Preliminary result)   Collection Time: 05/01/15  4:41 AM  Result Value Ref Range Status   Specimen Description BLOOD RIGHT HAND  Final   Special Requests BOTTLES DRAWN AEROBIC AND ANAEROBIC 5CC  Final   Culture   Final           BLOOD CULTURE RECEIVED NO GROWTH TO DATE CULTURE WILL BE HELD FOR 5 DAYS BEFORE ISSUING A FINAL NEGATIVE REPORT Performed at [facility]    Report Status PENDING  Incomplete     Studies: No results found.  Scheduled Meds:  Scheduled Meds: . amitriptyline  75 mg Oral QHS  . amLODipine  5 mg Oral Daily  . atenolol  50 mg Oral QHS  . azithromycin  500 mg Intravenous Q24H  . cefTRIAXone (ROCEPHIN)  IV  1 g Intravenous Q24H  . enoxaparin (LOVENOX) injection  40 mg Subcutaneous Q24H  . escitalopram  10 mg Oral Daily  .  fluticasone  2 spray Each Nare BID  . levothyroxine  75 mcg Oral QAC breakfast  . loratadine  10 mg Oral Daily  . mesalamine  1,000 mg Oral TID AC & HS  . multivitamin with minerals  1 tablet Oral Daily  . pantoprazole  40 mg Oral Daily  . simvastatin  20 mg Oral Daily   Continuous Infusions:   Time  spent on care of this patient: 11 min   [place], MD 05/03/2015, 11:57 AM  LOS: 2 days   [facility] Office  [phone] Pager - Text Page per [email or web address]  If 7PM-7AM, please contact night-coverage [email or web address]

## 2015-05-03 NOTE — Progress Notes (Signed)
Attempted to check walking O2 sat and she refused at this time. Said she would like a shower first and will walk once her husband gets here. O2 sat 94% resting on RA. Will attempt to ambulate later.

## 2015-05-03 NOTE — Progress Notes (Signed)
SATURATION QUALIFICATIONS: (This note is used to comply with regulatory documentation for home oxygen)  Patient Saturations on Room Air at Rest = 89%  Patient Saturations on Room Air while Ambulating = 86% (very short distance)  Patient Saturations on 2 Liters of oxygen while Ambulating = 92%  Please briefly explain why patient needs home oxygen: desats on exertion

## 2015-05-04 DIAGNOSIS — J9621 Acute and chronic respiratory failure with hypoxia: Secondary | ICD-10-CM

## 2015-05-04 DIAGNOSIS — K5090 Crohn's disease, unspecified, without complications: Secondary | ICD-10-CM

## 2015-05-04 MED ORDER — LEVOFLOXACIN 750 MG PO TABS: 750.0000 mg | ORAL_TABLET | Freq: Every day | ORAL | Status: DC

## 2015-05-04 NOTE — Progress Notes (Signed)
SATURATION QUALIFICATIONS: (This note is used to comply with regulatory documentation for home oxygen)  Patient Saturations on Room Air at Rest = 94%  Patient Saturations on Room Air while Ambulating = 88%  Patient Saturations on 2 Liters of oxygen while Ambulating = 92%  Please briefly explain why patient needs home oxygen:

## 2015-05-04 NOTE — Discharge Summary (Addendum)
Physician Discharge Summary  Kim Chavez LYY:[id number] DOB: [birth year] DOA: 05/01/2015  PCP: Marton Redwood, MD  Admit date: 05/01/2015 Discharge date: 05/04/2015  Time spent: 50 minutes  Recommendations for Outpatient Follow-up:  1. She has been advised to see her PCP later this week  Discharge Condition: stable Diet recommendation: heart healthy, low sodium  Discharge Diagnoses:  Principal Problem:   CAP (community acquired pneumonia)/ acute hypoxic resp failure  Active Problems:   Hypothyroidism   Essential hypertension   Crohn's disease   Hyperlipidemia    History of present illness:  Kim Chavez is a 69 y.o. female Kim Chavez is a 69 year old female with a past medical history of hypertension, hyperlipidemia, hypothyroidism, breast cancer, Crohn's disease who has been admitted to the hospital for multifocal pneumonia.  Hospital Course:  Principal Problem:  CAP (community acquired pneumonia)/acute hypoxic respiratory failure -She has had symptoms of a cough for 4-6 weeks-she states she was treated with doxycycline and prednisone which was prescribed by urgent care a few weeks ago -Continue ceftriaxone and azithromycin-strep pneumonia urinary antigen negative-Legionella still pending - influenza panel negative  - will transition to Levaquin on discharge today and continue for 6 more days to complete a 10 day course  Active Problems:  Hypothyroidism -Continue levothyroxine   Essential hypertension - Continue atenolol   Crohn's disease - Continue mesalamine capsules   Discharge Exam: Filed Weights   05/01/15 0007 05/01/15 0538  Weight: 78.472 kg (173 lb) 76.885 kg (169 lb 8 oz)   Filed Vitals:   05/04/15 0947  BP: 124/66  Pulse:   Temp:   Resp:     General: AAO x 3, no distress Cardiovascular: RRR, no murmurs  Respiratory: clear to auscultation bilaterally GI: soft, non-tender, non-distended, bowel sound positive  Discharge  Instructions You were cared for by a hospitalist during your hospital stay. If you have any questions about your discharge medications or the care you received while you were in the hospital after you are discharged, you can call the unit and asked to speak with the hospitalist on call if the hospitalist that took care of you is not available. Once you are discharged, your primary care physician will handle any further medical issues. Please note that NO REFILLS for any discharge medications will be authorized once you are discharged, as it is imperative that you return to your primary care physician (or establish a relationship with a primary care physician if you do not have one) for your aftercare needs so that they can reassess your need for medications and monitor your lab values.      Discharge Instructions    Diet - low sodium heart healthy    Complete by:  As directed      Increase activity slowly    Complete by:  As directed             Medication List    TAKE these medications        albuterol 108 (90 BASE) MCG/ACT inhaler  Commonly known as:  PROVENTIL HFA;VENTOLIN HFA  Inhale 1-2 puffs into the lungs every 6 (six) hours as needed for wheezing or shortness of breath.     amitriptyline 75 MG tablet  Commonly known as:  ELAVIL  Take 75 mg by mouth at bedtime.     amLODipine 5 MG tablet  Commonly known as:  NORVASC  Take 5 mg by mouth daily.     atenolol 50 MG tablet  Commonly known as:  TENORMIN  Take 50 mg by mouth at bedtime.     cetirizine 10 MG tablet  Commonly known as:  ZYRTEC  Take 10 mg by mouth daily.     Cholecalciferol 5000 UNITS Tabs  Take 1 tablet by mouth daily.     Co Q-10 100 MG Caps  Take 1 tablet by mouth daily.     diazepam 5 MG tablet  Commonly known as:  VALIUM  Take 5 mg by mouth every 6 (six) hours as needed for anxiety.     escitalopram 10 MG tablet  Commonly known as:  LEXAPRO  Take 10 mg by mouth daily.     fluticasone 50 MCG/ACT  nasal spray  Commonly known as:  FLONASE  Place 2 sprays into both nostrils 2 (two) times daily.     HYDROcodone-acetaminophen 5-325 MG per tablet  Commonly known as:  NORCO/VICODIN  Take 1 tablet by mouth every 6 (six) hours as needed (migraine headache).     ipratropium 0.06 % nasal spray  Commonly known as:  ATROVENT  Place 2 sprays into both nostrils 4 (four) times daily. For nasal congestion     levofloxacin 750 MG tablet  Commonly known as:  LEVAQUIN  Take 1 tablet (750 mg total) by mouth daily.     levothyroxine 75 MCG tablet  Commonly known as:  SYNTHROID, LEVOTHROID  Take 75 mcg by mouth daily before breakfast.     mesalamine 500 MG CR capsule  Commonly known as:  PENTASA  Take 1,000 mg by mouth 4 (four) times daily.     multivitamin with minerals Tabs tablet  Take 1 tablet by mouth daily.     omeprazole 20 MG capsule  Commonly known as:  PRILOSEC  Take 20 mg by mouth 2 (two) times daily before a meal.     PROBIOTIC DAILY PO  Take 1 tablet by mouth daily.     simvastatin 20 MG tablet  Commonly known as:  ZOCOR  Take 20 mg by mouth daily.       Allergies  Allergen Reactions  . Erythromycin Nausea And Vomiting  . Tape Rash  . Codeine   . Neosporin [Neomycin-Bacitracin Zn-Polymyx]   . Penicillins       The results of significant diagnostics from this hospitalization (including imaging, microbiology, ancillary and laboratory) are listed below for reference.    Significant Diagnostic Studies: Dg Chest 2 View  04/19/2015   CLINICAL DATA:  Cough for 1 month.  EXAM: CHEST  2 VIEW  COMPARISON:  03/09/2014  FINDINGS: Lung volumes are low. There is linear opacity at the lung bases seen anteriorly on the lateral view, consistent with atelectasis. No areas of lung consolidation is seen to suggest pneumonia. There is no pulmonary edema. No pleural effusion or pneumothorax.  Cardiac silhouette is normal in size. No mediastinal or hilar masses or evidence of adenopathy.   Bony thorax is demineralized. There is a slight depression of the upper endplate of what appears to be T11, stable.  IMPRESSION: No acute cardiopulmonary disease. No significant change from the prior study.   Electronically Signed   By: Lajean Manes M.D.   On: 04/19/2015 12:43   Dg Shoulder Right  05/01/2015   CLINICAL DATA:  Right shoulder pain.  EXAM: RIGHT SHOULDER - 2+ VIEW  COMPARISON:  None.  FINDINGS: No fracture or dislocation. The alignment and joint spaces are maintained. No evident focal lesion. Mild proliferative change at the acromioclavicular joint. No abnormal soft tissue calcifications.  IMPRESSION:  Mild proliferative change at the acromioclavicular joint. No acute bony abnormality.   Electronically Signed   By: Jeb Levering M.D.   On: 05/01/2015 01:33   Ct Angio Chest Pe W/cm &/or Wo Cm  05/01/2015   CLINICAL DATA:  Shortness of breath. Right shoulder pain. Chest pain.  EXAM: CT ANGIOGRAPHY CHEST WITH CONTRAST  TECHNIQUE: Multidetector CT imaging of the chest was performed using the standard protocol during bolus administration of intravenous contrast. Multiplanar CT image reconstructions and MIPs were obtained to evaluate the vascular anatomy.  CONTRAST:  105m OMNIPAQUE IOHEXOL 350 MG/ML SOLN  COMPARISON:  Radiographs 04/19/2015.  Chest CT 01/01/2013  FINDINGS: There are no filling defects within the central pulmonary arteries to suggest pulmonary embolus. Distal branches are suboptimally assessed related to body habitus and contrast bolus timing.  The thoracic aorta is normal in caliber with moderate atherosclerosis. No dissection. Heart is at the upper limits of normal in size. Calcified left hilar lymph nodes consistent prior granulomatous disease. No noncalcified mediastinal or hilar adenopathy. No pleural or pericardial effusion. The previously questioned left thyroid nodule is not well seen.  Lung volumes are low. Consolidations within the right lower lobe, right middle lobe, and  left lower lobe. Faint ground-glass opacity at the right lung apex. The small pulmonary nodules on prior exam are obscured. Calcified granuloma in the left lower lobe.  There are multiple cysts throughout the liver, largest in the right hepatic lobe measures 15 cm. These have increased in size compared to prior exam. There is background hepatic steatosis and hepatomegaly. Bilateral breast implants noted. There is no axillary adenopathy.  There are no acute or suspicious osseous abnormalities. Small Schmorl's nodes at the thoracolumbar junction. Right ribs are intact.  Review of the MIP images confirms the above findings.  IMPRESSION: 1. No central pulmonary embolus. 2. Consolidations within the right lower lobe, right middle lobe, and left lower lobe, dependently. This may reflect atelectasis, pneumonia, or aspiration. 3. Low lung volumes with elevation of right hemidiaphragm. Multiple hepatic cysts have increased in size from prior exam, a large cyst in the right hepatic lobe measuring 15 cm. There is background hepatomegaly and severe hepatic steatosis, partially included.   Electronically Signed   By: MJeb LeveringM.D.   On: 05/01/2015 04:02    Microbiology: Recent Results (from the past 240 hour(s))  Culture, blood (routine x 2)     Status: None (Preliminary result)   Collection Time: 05/01/15  4:37 AM  Result Value Ref Range Status   Specimen Description BLOOD RIGHT ARM  Final   Special Requests BOTTLES DRAWN AEROBIC AND ANAEROBIC 5CC  Final   Culture   Final           BLOOD CULTURE RECEIVED NO GROWTH TO DATE CULTURE WILL BE HELD FOR 5 DAYS BEFORE ISSUING A FINAL NEGATIVE REPORT Performed at [facility]   Report Status PENDING  Incomplete  Culture, blood (routine x 2)     Status: None (Preliminary result)   Collection Time: 05/01/15  4:41 AM  Result Value Ref Range Status   Specimen Description BLOOD RIGHT HAND  Final   Special Requests BOTTLES DRAWN AEROBIC AND ANAEROBIC 5CC   Final   Culture   Final           BLOOD CULTURE RECEIVED NO GROWTH TO DATE CULTURE WILL BE HELD FOR 5 DAYS BEFORE ISSUING A FINAL NEGATIVE REPORT Performed at [facility]   Report Status PENDING  Incomplete  Labs: Basic Metabolic Panel:  Recent Labs Lab 05/01/15 0137 05/01/15 0835 05/02/15 0431  NA 133* 136 139  K 3.6 3.9 4.0  CL 97* 102 108  CO2 25 24 25   GLUCOSE 121* 108* 96  BUN 19 12 9   CREATININE 0.97 0.79 0.92  CALCIUM 8.8* 8.8* 8.5*   Liver Function Tests:  Recent Labs Lab 05/01/15 0137 05/01/15 0835  AST 37 33  ALT 88* 80*  ALKPHOS 112 107  BILITOT 1.2 0.9  PROT 6.6 7.1  ALBUMIN 3.9 3.7    Recent Labs Lab 05/01/15 0137  LIPASE 14*   No results for input(s): AMMONIA in the last 168 hours. CBC:  Recent Labs Lab 05/01/15 0137 05/01/15 0835 05/02/15 0431  WBC 12.4* 12.1* 6.3  NEUTROABS 10.6* 9.4*  --   HGB 13.6 13.3 11.6*  HCT 41.0 38.7 34.7*  MCV 83.0 81.6 83.2  PLT 246 219 223   Cardiac Enzymes:  Recent Labs Lab 05/01/15 0835  TROPONINI <0.03   BNP: BNP (last 3 results)  Recent Labs  05/01/15 0835  BNP 57.9    ProBNP (last 3 results) No results for input(s): PROBNP in the last 8760 hours.  CBG: No results for input(s): GLUCAP in the last 168 hours.     SignedDebbe Odea, MD [facility] 05/04/2015, 12:31 PM

## 2015-05-04 NOTE — Progress Notes (Signed)
Patient discharge teaching given, including activity, diet, follow-up appoints, and medications. Patient verbalized understanding of all discharge instructions. IV access was d/c'd. Vitals are stable. Skin is intact except as charted in most recent assessments. Pt to be escorted out by NT, to be driven home by family. 

## 2015-05-05 LAB — LEGIONELLA ANTIGEN, URINE

## 2015-05-07 LAB — CULTURE, BLOOD (ROUTINE X 2)
CULTURE: NO GROWTH
Culture: NO GROWTH

## 2015-06-03 DIAGNOSIS — B373 Candidiasis of vulva and vagina: Secondary | ICD-10-CM | POA: Diagnosis not present

## 2015-06-03 DIAGNOSIS — B370 Candidal stomatitis: Secondary | ICD-10-CM | POA: Diagnosis not present

## 2015-06-03 DIAGNOSIS — R0781 Pleurodynia: Secondary | ICD-10-CM | POA: Diagnosis not present

## 2015-06-03 DIAGNOSIS — J189 Pneumonia, unspecified organism: Secondary | ICD-10-CM | POA: Diagnosis not present

## 2015-06-03 DIAGNOSIS — Z6828 Body mass index (BMI) 28.0-28.9, adult: Secondary | ICD-10-CM | POA: Diagnosis not present

## 2015-06-24 DIAGNOSIS — N819 Female genital prolapse, unspecified: Secondary | ICD-10-CM | POA: Diagnosis not present

## 2015-06-24 DIAGNOSIS — Z124 Encounter for screening for malignant neoplasm of cervix: Secondary | ICD-10-CM | POA: Diagnosis not present

## 2015-06-24 DIAGNOSIS — Z6828 Body mass index (BMI) 28.0-28.9, adult: Secondary | ICD-10-CM | POA: Diagnosis not present

## 2015-06-24 DIAGNOSIS — Z01419 Encounter for gynecological examination (general) (routine) without abnormal findings: Secondary | ICD-10-CM | POA: Diagnosis not present

## 2015-06-24 DIAGNOSIS — Z1389 Encounter for screening for other disorder: Secondary | ICD-10-CM | POA: Diagnosis not present

## 2015-06-25 DIAGNOSIS — Z124 Encounter for screening for malignant neoplasm of cervix: Secondary | ICD-10-CM | POA: Diagnosis not present

## 2015-07-01 DIAGNOSIS — H0234 Blepharochalasis left upper eyelid: Secondary | ICD-10-CM | POA: Diagnosis not present

## 2015-07-01 DIAGNOSIS — H0231 Blepharochalasis right upper eyelid: Secondary | ICD-10-CM | POA: Diagnosis not present

## 2015-07-01 DIAGNOSIS — Z9013 Acquired absence of bilateral breasts and nipples: Secondary | ICD-10-CM | POA: Diagnosis not present

## 2015-07-01 DIAGNOSIS — H5340 Unspecified visual field defects: Secondary | ICD-10-CM | POA: Diagnosis not present

## 2015-07-28 ENCOUNTER — Emergency Department (HOSPITAL_COMMUNITY): Payer: Medicare Other

## 2015-07-28 ENCOUNTER — Encounter (HOSPITAL_COMMUNITY): Payer: Self-pay

## 2015-07-28 ENCOUNTER — Emergency Department (HOSPITAL_COMMUNITY)
Admission: EM | Admit: 2015-07-28 | Discharge: 2015-07-28 | Disposition: A | Discharge status: Home / Self Care | Payer: Medicare Other | Attending: Emergency Medicine | Admitting: Emergency Medicine

## 2015-07-28 DIAGNOSIS — N281 Cyst of kidney, acquired: Secondary | ICD-10-CM | POA: Diagnosis not present

## 2015-07-28 DIAGNOSIS — M797 Fibromyalgia: Secondary | ICD-10-CM | POA: Insufficient documentation

## 2015-07-28 DIAGNOSIS — R0982 Postnasal drip: Secondary | ICD-10-CM | POA: Diagnosis not present

## 2015-07-28 DIAGNOSIS — E079 Disorder of thyroid, unspecified: Secondary | ICD-10-CM | POA: Insufficient documentation

## 2015-07-28 DIAGNOSIS — Z853 Personal history of malignant neoplasm of breast: Secondary | ICD-10-CM | POA: Insufficient documentation

## 2015-07-28 DIAGNOSIS — R0602 Shortness of breath: Secondary | ICD-10-CM | POA: Insufficient documentation

## 2015-07-28 DIAGNOSIS — R5383 Other fatigue: Secondary | ICD-10-CM | POA: Insufficient documentation

## 2015-07-28 DIAGNOSIS — Z87448 Personal history of other diseases of urinary system: Secondary | ICD-10-CM | POA: Diagnosis not present

## 2015-07-28 DIAGNOSIS — R05 Cough: Secondary | ICD-10-CM | POA: Insufficient documentation

## 2015-07-28 DIAGNOSIS — Z880 Allergy status to penicillin: Secondary | ICD-10-CM | POA: Insufficient documentation

## 2015-07-28 DIAGNOSIS — R0789 Other chest pain: Secondary | ICD-10-CM | POA: Diagnosis not present

## 2015-07-28 DIAGNOSIS — R630 Anorexia: Secondary | ICD-10-CM | POA: Diagnosis not present

## 2015-07-28 DIAGNOSIS — M549 Dorsalgia, unspecified: Secondary | ICD-10-CM | POA: Diagnosis present

## 2015-07-28 DIAGNOSIS — Z79899 Other long term (current) drug therapy: Secondary | ICD-10-CM | POA: Diagnosis not present

## 2015-07-28 DIAGNOSIS — I10 Essential (primary) hypertension: Secondary | ICD-10-CM | POA: Insufficient documentation

## 2015-07-28 DIAGNOSIS — G43909 Migraine, unspecified, not intractable, without status migrainosus: Secondary | ICD-10-CM | POA: Diagnosis not present

## 2015-07-28 DIAGNOSIS — K7689 Other specified diseases of liver: Secondary | ICD-10-CM | POA: Insufficient documentation

## 2015-07-28 DIAGNOSIS — Z8719 Personal history of other diseases of the digestive system: Secondary | ICD-10-CM | POA: Diagnosis not present

## 2015-07-28 DIAGNOSIS — R0781 Pleurodynia: Secondary | ICD-10-CM | POA: Diagnosis not present

## 2015-07-28 DIAGNOSIS — Z7951 Long term (current) use of inhaled steroids: Secondary | ICD-10-CM | POA: Diagnosis not present

## 2015-07-28 LAB — HEPATIC FUNCTION PANEL
ALBUMIN: 3.3 g/dL — AB (ref 3.5–5.0)
ALT: 27 U/L (ref 14–54)
AST: 28 U/L (ref 15–41)
Alkaline Phosphatase: 129 U/L — ABNORMAL HIGH (ref 38–126)
Bilirubin, Direct: 0.2 mg/dL (ref 0.1–0.5)
Indirect Bilirubin: 0.5 mg/dL (ref 0.3–0.9)
Total Bilirubin: 0.7 mg/dL (ref 0.3–1.2)
Total Protein: 5.9 g/dL — ABNORMAL LOW (ref 6.5–8.1)

## 2015-07-28 LAB — URINALYSIS, ROUTINE W REFLEX MICROSCOPIC
Bilirubin Urine: NEGATIVE
Glucose, UA: NEGATIVE mg/dL
Hgb urine dipstick: NEGATIVE
KETONES UR: NEGATIVE mg/dL
NITRITE: NEGATIVE
PROTEIN: NEGATIVE mg/dL
Specific Gravity, Urine: 1.019 (ref 1.005–1.030)
UROBILINOGEN UA: 0.2 mg/dL (ref 0.0–1.0)
pH: 5.5 (ref 5.0–8.0)

## 2015-07-28 LAB — URINE MICROSCOPIC-ADD ON

## 2015-07-28 LAB — CBC WITH DIFFERENTIAL/PLATELET
BASOS PCT: 1 % (ref 0–1)
Basophils Absolute: 0 10*3/uL (ref 0.0–0.1)
Eosinophils Absolute: 0.1 10*3/uL (ref 0.0–0.7)
Eosinophils Relative: 2 % (ref 0–5)
HEMATOCRIT: 38.9 % (ref 36.0–46.0)
HEMOGLOBIN: 13.1 g/dL (ref 12.0–15.0)
Lymphocytes Relative: 16 % (ref 12–46)
Lymphs Abs: 1.1 10*3/uL (ref 0.7–4.0)
MCH: 27.5 pg (ref 26.0–34.0)
MCHC: 33.7 g/dL (ref 30.0–36.0)
MCV: 81.6 fL (ref 78.0–100.0)
MONOS PCT: 7 % (ref 3–12)
Monocytes Absolute: 0.5 10*3/uL (ref 0.1–1.0)
NEUTROS ABS: 5.4 10*3/uL (ref 1.7–7.7)
Neutrophils Relative %: 74 % (ref 43–77)
Platelets: 231 10*3/uL (ref 150–400)
RBC: 4.77 MIL/uL (ref 3.87–5.11)
RDW: 13.8 % (ref 11.5–15.5)
WBC: 7.2 10*3/uL (ref 4.0–10.5)

## 2015-07-28 LAB — BASIC METABOLIC PANEL
ANION GAP: 9 (ref 5–15)
BUN: 10 mg/dL (ref 6–20)
CHLORIDE: 104 mmol/L (ref 101–111)
CO2: 27 mmol/L (ref 22–32)
Calcium: 9 mg/dL (ref 8.9–10.3)
Creatinine, Ser: 0.8 mg/dL (ref 0.44–1.00)
GFR calc Af Amer: >60 mL/min (ref >60)
GFR calc non Af Amer: >60 mL/min (ref >60)
Glucose, Bld: 100 mg/dL — ABNORMAL HIGH (ref 65–99)
Potassium: 3.5 mmol/L (ref 3.5–5.1)
SODIUM: 140 mmol/L (ref 135–145)

## 2015-07-28 LAB — I-STAT CG4 LACTIC ACID, ED: LACTIC ACID, VENOUS: 1.11 mmol/L (ref 0.5–2.0)

## 2015-07-28 MED ORDER — IOHEXOL 300 MG/ML  SOLN
100.0000 mL | Freq: Once | INTRAMUSCULAR | Status: AC | PRN
  Administered 2015-07-28: 100 mL via INTRAVENOUS

## 2015-07-28 MED ORDER — LEVOFLOXACIN 750 MG PO TABS: 750.0000 mg | ORAL_TABLET | Freq: Every day | ORAL | Status: DC

## 2015-07-28 NOTE — Discharge Instructions (Signed)
Please obtain all of your results from medical records or have your doctors office obtain the results - share them with your doctor - you should be seen at your doctors office in the next 2 days. Call today to arrange your follow up. Take the medications as prescribed. Please review all of the medicines and only take them if you do not have an allergy to them. Please be aware that if you are taking birth control pills, taking other prescriptions, ESPECIALLY ANTIBIOTICS may make the birth control ineffective - if this is the case, either do not engage in sexual activity or use alternative methods of birth control such as condoms until you have finished the medicine and your family doctor says it is OK to restart them. If you are on a blood thinner such as COUMADIN, be aware that any other medicine that you take may cause the coumadin to either work too much, or not enough - you should have your coumadin level rechecked in next 7 days if this is the case.  ?  It is also a possibility that you have an allergic reaction to any of the medicines that you have been prescribed - Everybody reacts differently to medications and while MOST people have no trouble with most medicines, you may have a reaction such as nausea, vomiting, rash, swelling, shortness of breath. If this is the case, please stop taking the medicine immediately and contact your physician.  ?  You should return to the ER if you develop severe or worsening symptoms.   Pneumonia, Adult   Pneumonia is an infection of the lungs. It may be caused by a germ (virus or bacteria). Some types of pneumonia can spread easily from person to person. This can happen when you cough or sneeze.  HOME CARE  Only take medicine as told by your doctor.  Take your medicine (antibiotics) as told. Finish it even if you start to feel better.  Do not smoke.  You may use a vaporizer or humidifier in your room. This can help loosen thick spit (mucus).  Sleep so you are  almost sitting up (semi-upright). This helps reduce coughing.  Rest. A shot (vaccine) can help prevent pneumonia. Shots are often advised for:  People over 89 years old.  Patients on chemotherapy.  People with long-term (chronic) lung problems.  People with immune system problems. GET HELP RIGHT AWAY IF:  You are getting worse.  You cannot control your cough, and you are losing sleep.  You cough up blood.  Your pain gets worse, even with medicine.  You have a fever.  Any of your problems are getting worse, not better.  You have shortness of breath or chest pain. MAKE SURE YOU:  Understand these instructions.  Will watch your condition.  Will get help right away if you are not doing well or get worse. Document Released: 05/17/2008 Document Revised: 02/21/2012 Document Reviewed: 02/19/2011  [facility] Patient Information 2015 [place], [place]. This information is not intended to replace advice given to you by your health care provider. Make sure you discuss any questions you have with your health care provider.

## 2015-07-28 NOTE — ED Notes (Signed)
MD at bedside. 

## 2015-07-28 NOTE — ED Notes (Signed)
C/o right side back pain radiating around rib region. States pain is similar to symptoms of past pneumonia. Appreciates pain with inspiration and movement.

## 2015-07-28 NOTE — ED Provider Notes (Signed)
The patient is a 69 year old female, she has a history of recently being evaluated for flank pain, coughing, in May she was diagnosed with multifocal pneumonia, state 3 days in the hospital and was discharged home. She finished her antibiotics and has done very well until recently over the last several days where she has developed some pain in the right side of her lower mid back, lateral chest wall having significant pain when she takes a deep breath but no coughing fevers chills. She states over the last several days she has felt generally "not well" similar to the flu but denies any other focal symptoms including rashes, diarrhea, dysuria, nausea or vomiting. On exam the patient has tenderness to palpation in the right midaxillary line, there is no rash in this location, the tenderness does not extend onto the anterior chest or the posterior chest. She also has tenderness in the right upper quadrant. (The patient was known to have a large hepatic cyst seen on prior CT scan several months ago). She does not complain of abdominal pain but due to the presence of reproducible tenderness on exam in the presence of some chest wall pain will consider an ultrasound, labs including a urinalysis. I have personally seen, viewed the images of the 2 view chest x-ray PA and lateral views, I do not see any signs of infiltrate or mediastinal abnormalities, no abnormalities of the soft tissues. I agree with the radiologist interpretation of these x-rays. Labs pending. The patient is in no distress.  I have personally viewed and interpreted the imaging and agree with radiologist interpretation.   Medical screening examination/treatment/procedure(s) were conducted as a shared visit with non-physician practitioner(s) and myself.  I personally evaluated the patient during the encounter.  Clinical Impression:   Final diagnoses:  Right-sided chest wall pain  Liver cyst         Noemi Chapel, MD 07/30/15 [phone]

## 2015-07-28 NOTE — ED Provider Notes (Signed)
CSN: [id number]     Arrival date & time 07/28/15  0913 History   First MD Initiated Contact with Patient 07/28/15 0915     Chief Complaint  Patient presents with  . Back Pain    The history is provided by the patient. No language interpreter was used.    Kim Chavez is a 69 y.o. female with a PMH of HTN, hyopthyroidism, and breast cancer s/p mastectomy who presents to the ED with right sided chest wall pain. She was hospitalized 04/2015 for CAP, and reports her symptoms feel similar to how she felt at that time. She states she started to generally not feel well on Saturday, and developed pain in her right axillary chest wall over her right lower ribs on Sunday. She denies recent injury or illness. She denies radiation of pain. She reports her pain is constant, and is exacerbated by deep inspiration and movement. She has tried heating pads for symptom relief, which has not been effective. She reports she has a dry cough when she wakes up in the morning, which she states is unchanged from her cough at baseline. She also reports shortness of breath, which she attributes to pain. She denies congestion. She denies fever, chills, lightheadedness, dizziness, chest pain, abdominal pain, nausea, vomiting, dysuria, urgency, frequency, leg swelling, history of DVT, recent travel or immobility.   Past Medical History  Diagnosis Date  . RUQ pain   . Hypertension   . Thyroid disease   . Overactive bladder   . Migraine   . Sleep apnea   . Fibromyalgia   . Breast cancer    Past Surgical History  Procedure Laterality Date  . Appendectomy    . Simple mastectomy w/ sentinel node biopsy Bilateral 2011   Family History  Problem Relation Age of Onset  . Breast cancer Mother   . CAD Father    Social History  Substance Use Topics  . Smoking status: Never Smoker   . Smokeless tobacco: None  . Alcohol Use: No   OB History    No data available      Review of Systems  Constitutional: Positive  for appetite change and fatigue. Negative for fever, chills and activity change.       Reports her appetite has been decreased since her hospitalization for CAP in May 2016.  HENT: Positive for postnasal drip. Negative for congestion.   Respiratory: Positive for cough and shortness of breath. Negative for wheezing and stridor.        Reports dry cough, which she experiences every morning when she wakes up, and states this has not changed.  Cardiovascular: Negative for chest pain, palpitations and leg swelling.  Gastrointestinal: Negative for nausea, vomiting, abdominal pain, diarrhea, constipation and abdominal distention.  Genitourinary: Negative for dysuria, urgency, frequency and flank pain.  Musculoskeletal: Negative for myalgias, back pain, arthralgias and neck pain.  Skin: Negative for color change, pallor, rash and wound.  Neurological: Negative for dizziness, weakness and light-headedness.     Allergies  Erythromycin; Tape; Codeine; Neosporin; and Penicillins  Home Medications   Prior to Admission medications   Medication Sig Start Date End Date Taking? Authorizing Provider  albuterol (PROVENTIL HFA;VENTOLIN HFA) 108 (90 BASE) MCG/ACT inhaler Inhale 1-2 puffs into the lungs every 6 (six) hours as needed for wheezing or shortness of breath. 04/19/15  Yes Robyn Haber, MD  amitriptyline (ELAVIL) 75 MG tablet Take 75 mg by mouth at bedtime.   Yes Historical Provider, MD  amLODipine (NORVASC)  5 MG tablet Take 5 mg by mouth daily.   Yes Historical Provider, MD  atenolol (TENORMIN) 50 MG tablet Take 50 mg by mouth at bedtime.   Yes Historical Provider, MD  cetirizine (ZYRTEC) 10 MG tablet Take 10 mg by mouth daily.   Yes Historical Provider, MD  Cholecalciferol 5000 UNITS TABS Take 1 tablet by mouth daily.   Yes Historical Provider, MD  Coenzyme Q10 (CO Q-10) 100 MG CAPS Take 1 tablet by mouth daily.   Yes Historical Provider, MD  diazepam (VALIUM) 5 MG tablet Take 5 mg by mouth every  6 (six) hours as needed for anxiety.   Yes Historical Provider, MD  escitalopram (LEXAPRO) 10 MG tablet Take 10 mg by mouth daily.   Yes Historical Provider, MD  fluticasone (FLONASE) 50 MCG/ACT nasal spray Place 2 sprays into both nostrils 2 (two) times daily.   Yes Historical Provider, MD  HYDROcodone-acetaminophen (NORCO/VICODIN) 5-325 MG per tablet Take 1 tablet by mouth every 6 (six) hours as needed (migraine headache).   Yes Historical Provider, MD  levothyroxine (SYNTHROID, LEVOTHROID) 75 MCG tablet Take 75 mcg by mouth daily before breakfast.   Yes Historical Provider, MD  mesalamine (PENTASA) 500 MG CR capsule Take 1,000 mg by mouth 4 (four) times daily.   Yes Historical Provider, MD  Multiple Vitamin (MULTIVITAMIN WITH MINERALS) TABS tablet Take 1 tablet by mouth daily.   Yes Historical Provider, MD  omeprazole (PRILOSEC) 20 MG capsule Take 20 mg by mouth 2 (two) times daily before a meal.   Yes Historical Provider, MD  Probiotic Product (PROBIOTIC DAILY PO) Take 1 tablet by mouth daily.   Yes Historical Provider, MD  simvastatin (ZOCOR) 20 MG tablet Take 20 mg by mouth daily.   Yes Historical Provider, MD  ipratropium (ATROVENT) 0.06 % nasal spray Place 2 sprays into both nostrils 4 (four) times daily. For nasal congestion Patient not taking: Reported on 05/01/2015 03/09/14   Audelia Hives Presson, PA  levofloxacin (LEVAQUIN) 750 MG tablet Take 1 tablet (750 mg total) by mouth daily. 07/28/15   [place] Sabin Westfall, PA-C    BP 122/70 mmHg  Pulse 67  Temp(Src) 97.9 F (36.6 C) (Oral)  Resp 16  Wt 169 lb (76.658 kg)  SpO2 95% Physical Exam  Constitutional: She is oriented to person, place, and time. She appears well-developed and well-nourished.  HENT:  Head: Normocephalic and atraumatic.  Right Ear: External ear normal.  Left Ear: External ear normal.  Nose: Nose normal.  Mouth/Throat: Uvula is midline, oropharynx is clear and moist and mucous membranes are normal.  Eyes:  Conjunctivae are normal. Pupils are equal, round, and reactive to light.  Neck: Normal range of motion. Neck supple. No spinous process tenderness and no muscular tenderness present.  Cardiovascular: Normal rate, regular rhythm, normal heart sounds and intact distal pulses.   Pulmonary/Chest: Effort normal. No accessory muscle usage. No respiratory distress. She has no decreased breath sounds. She has no wheezes. She has rales in the right middle field. She exhibits tenderness. She exhibits no crepitus and no deformity.  Mild tenderness to palpation over mid-axillary aspect of right chest wall.  Abdominal: Soft. Normal appearance and bowel sounds are normal. She exhibits no distension and no mass. There is tenderness. There is no rebound and no guarding.  Mild tenderness to palpation in RUQ.  Musculoskeletal: Normal range of motion. She exhibits no edema or tenderness.  Neurological: She is alert and oriented to person, place, and time.  Skin:  Skin is warm and dry. No rash noted. No erythema. No pallor.  Psychiatric: She has a normal mood and affect. Her behavior is normal.  Nursing note and vitals reviewed.   ED Course  Procedures (including critical care time)  Labs Review Labs Reviewed  BASIC METABOLIC PANEL - Abnormal; Notable for the following:    Glucose, Bld 100 (*)    All other components within normal limits  URINALYSIS, ROUTINE W REFLEX MICROSCOPIC (NOT AT [facility]) - Abnormal; Notable for the following:    Leukocytes, UA MODERATE (*)    All other components within normal limits  HEPATIC FUNCTION PANEL - Abnormal; Notable for the following:    Total Protein 5.9 (*)    Albumin 3.3 (*)    Alkaline Phosphatase 129 (*)    All other components within normal limits  URINE MICROSCOPIC-ADD ON - Abnormal; Notable for the following:    Squamous Epithelial / LPF FEW (*)    Bacteria, UA FEW (*)    All other components within normal limits  CBC WITH DIFFERENTIAL/PLATELET  I-STAT CG4  LACTIC ACID, ED    Imaging Review Dg Chest 2 View  07/28/2015   CLINICAL DATA:  Right side back pain radiating around ribs for 2 days. Pain worsens with inspiration and movement.  EXAM: CHEST  2 VIEW  COMPARISON:  04/19/2015  FINDINGS: Elevation of the right hemidiaphragm, increased since prior study. No confluent airspace opacity. Heart is normal size. No effusions. No acute bony abnormality.  IMPRESSION: Increasing elevation of the right hemidiaphragm. No focal opacities.   Electronically Signed   By: Rolm Baptise M.D.   On: 07/28/2015 10:02   Ct Abdomen Pelvis W Contrast  07/28/2015   CLINICAL DATA:  Right-sided pain for 1 day. History of breast carcinoma. History of Crohn's disease  EXAM: CT ABDOMEN AND PELVIS WITH CONTRAST  TECHNIQUE: Multidetector CT imaging of the abdomen and pelvis was performed using the standard protocol following bolus administration of intravenous contrast.  CONTRAST:  132m OMNIPAQUE IOHEXOL 300 MG/ML  SOLN  COMPARISON:  June 29, 2012  FINDINGS: There is bibasilar lung atelectasis, most notably in the right middle lobe medially. Mild consolidation in this area in the medial segment right lobe of the liver may be present ; this area is incompletely visualized. There are breast implants bilaterally.  There are cysts throughout the liver. The largest cyst arises from the dome of the liver on the right, measuring approximately 16 x 15 x 14 cm. This cystic mass impresses upon and elevates the right hemidiaphragm and contributes to the atelectasis in the right base. Other cysts throughout the liver range in size from approximately 1 cm to as large as 6.9 x 6.8 x 4.7 cm. This next largest cyst is in the lateral segment left lobe and is lobulated. No noncystic liver lesions are identified. Gallbladder wall is not thickened. There is no biliary duct dilatation.  Kidneys bilaterally  Spleen, pancreas, and adrenals appear normal. There is a cyst arising from of the posterior aspect of the  lower pole of the right kidney measuring 1.8 x 1.6 cm. There is a 1.1 x 1.1 cm cyst in the lateral mid left kidney. There is mild scarring in the left kidney. There is no hydronephrosis on either side. There is an extrarenal pelvis in each kidney, an anatomic variant no renal or ureteral calculi are identified on either side.  In the pelvis, the urinary bladder is midline with normal wall thickness. There is no pelvic mass  or periaortic fluid collection. The uterus is anteverted. Appendix is absent.  There is no bowel obstruction. No free air or portal venous air. There is no bowel wall thickening or appreciable bowel wall enhancement. No fistulae identified. There is no terminal ileal inflammation. There is fat in the wall of the terminal ileum. There is no demonstrable ascites, adenopathy, or abscess in the abdomen or pelvis. There is atherosclerotic change in the aorta but no demonstrable aneurysm. There is degenerative change in the lumbar spine, most notably at L5-S1. There are no blastic or lytic bone lesions. There is slight anterior wedging of the T10 vertebral body, stable.  IMPRESSION: Multiple liver cysts. The largest cyst is seen on the right measuring approximately 16 x 15 x 14 cm. This cyst elevates the right hemidiaphragm and contributes to atelectatic change in the right base.  Atelectasis in the right middle lobe medially with questionable superimposed pneumonia. This area is incompletely visualized. There is patchy atelectasis elsewhere in both lung bases.  No bowel obstruction. No abscess. No bowel wall thickening or mesenteric thickening. Appendix is absent. Fat in the wall of the terminal ileum may be due to chronic changes of Crohn's disease. No fistula seen. The surrounding mesentery in this area is normal.  Status post previous mastectomies with bilateral breast implants. No adenopathy.   Electronically Signed   By: Lowella Grip III M.D.   On: 07/28/2015 12:00     I, Marella Chimes, personally reviewed and evaluated these images and lab results as part of my medical decision-making.   EKG Interpretation None      MDM   Final diagnoses:  Right-sided chest wall pain  Liver cyst   69 year old female with PMH of HTN, hyopthyroidism, and breast cancer s/p mastectomy presents with right sided chest wall pain exacerbated by deep inspiration and movement. Hospitalized 04/2015, at which time she was treated for CAP. Reports daily cough, unchanged from baseline, and shortness of breath, which she attributes to pain. Denies fever, chills, lightheadedness, dizziness, chest pain, abdominal pain, nausea, vomiting, dysuria, urgency, frequency, leg swelling, history of DVT, recent travel or immobility.   On exam, the patient is tender to palpation over her mid-axillary right chest wall and RUQ. No tachycardia. O2 sat 95% on RA. No respiratory distress.  No leukocytosis on CBC. BMP and lactic acid unremarkable. UA positive for leukocytes; however 0-2 WBC on urine microscopic. AST and ALT within normal limits, ALP elevated at 129. Hepatic cyst seen on CT in May 2016, therefore CT abdomen pelvis obtained for further evaluation. CT abdomen pelvis demonstrates multiple liver cysts (largest 16 x 15 x 14, which elevates the right hemidiaphragm) and atelectasis in right middle lobe medially with questionable superimposed pneumonia, which is most likely contributing to the patient's symptoms. Will discharge home with levaquin x 7 days. Patient to follow up with PCP for further evaluation and management of hepatic cysts. Return precautions discussed. Patient in agreement with plan.  BP 122/70 mmHg  Pulse 67  Temp(Src) 97.9 F (36.6 C) (Oral)  Resp 16  Wt 169 lb (76.658 kg)  SpO2 95%     Marella Chimes, PA-C 07/28/15 [place], PA-C 07/28/15 [place], MD 07/30/15 [phone]

## 2015-07-28 NOTE — ED Notes (Signed)
Pt return from ct scan

## 2015-07-28 NOTE — ED Notes (Signed)
Patient transported to X-ray 

## 2015-07-30 DIAGNOSIS — Z6828 Body mass index (BMI) 28.0-28.9, adult: Secondary | ICD-10-CM | POA: Diagnosis not present

## 2015-07-30 DIAGNOSIS — K7689 Other specified diseases of liver: Secondary | ICD-10-CM | POA: Diagnosis not present

## 2015-07-30 DIAGNOSIS — K5090 Crohn's disease, unspecified, without complications: Secondary | ICD-10-CM | POA: Diagnosis not present

## 2015-08-05 DIAGNOSIS — E785 Hyperlipidemia, unspecified: Secondary | ICD-10-CM | POA: Diagnosis not present

## 2015-08-05 DIAGNOSIS — R7301 Impaired fasting glucose: Secondary | ICD-10-CM | POA: Diagnosis not present

## 2015-08-05 DIAGNOSIS — E039 Hypothyroidism, unspecified: Secondary | ICD-10-CM | POA: Diagnosis not present

## 2015-08-11 DIAGNOSIS — C50919 Malignant neoplasm of unspecified site of unspecified female breast: Secondary | ICD-10-CM | POA: Diagnosis not present

## 2015-08-11 DIAGNOSIS — G4733 Obstructive sleep apnea (adult) (pediatric): Secondary | ICD-10-CM | POA: Diagnosis not present

## 2015-08-11 DIAGNOSIS — F329 Major depressive disorder, single episode, unspecified: Secondary | ICD-10-CM | POA: Diagnosis not present

## 2015-08-11 DIAGNOSIS — Z0000 Encounter for general adult medical examination without abnormal findings: Secondary | ICD-10-CM | POA: Diagnosis not present

## 2015-08-11 DIAGNOSIS — I10 Essential (primary) hypertension: Secondary | ICD-10-CM | POA: Diagnosis not present

## 2015-08-11 DIAGNOSIS — Z6829 Body mass index (BMI) 29.0-29.9, adult: Secondary | ICD-10-CM | POA: Diagnosis not present

## 2015-08-11 DIAGNOSIS — R7301 Impaired fasting glucose: Secondary | ICD-10-CM | POA: Diagnosis not present

## 2015-08-11 DIAGNOSIS — K5090 Crohn's disease, unspecified, without complications: Secondary | ICD-10-CM | POA: Diagnosis not present

## 2015-08-11 DIAGNOSIS — Z780 Asymptomatic menopausal state: Secondary | ICD-10-CM | POA: Diagnosis not present

## 2015-08-11 DIAGNOSIS — E785 Hyperlipidemia, unspecified: Secondary | ICD-10-CM | POA: Diagnosis not present

## 2015-08-11 DIAGNOSIS — Z23 Encounter for immunization: Secondary | ICD-10-CM | POA: Diagnosis not present

## 2015-08-11 DIAGNOSIS — Z1389 Encounter for screening for other disorder: Secondary | ICD-10-CM | POA: Diagnosis not present

## 2015-08-11 DIAGNOSIS — E039 Hypothyroidism, unspecified: Secondary | ICD-10-CM | POA: Diagnosis not present

## 2015-08-19 DIAGNOSIS — D225 Melanocytic nevi of trunk: Secondary | ICD-10-CM | POA: Diagnosis not present

## 2015-08-19 DIAGNOSIS — L821 Other seborrheic keratosis: Secondary | ICD-10-CM | POA: Diagnosis not present

## 2015-08-19 DIAGNOSIS — D485 Neoplasm of uncertain behavior of skin: Secondary | ICD-10-CM | POA: Diagnosis not present

## 2015-08-19 DIAGNOSIS — Z85828 Personal history of other malignant neoplasm of skin: Secondary | ICD-10-CM | POA: Diagnosis not present

## 2015-08-19 DIAGNOSIS — D2262 Melanocytic nevi of left upper limb, including shoulder: Secondary | ICD-10-CM | POA: Diagnosis not present

## 2015-08-19 DIAGNOSIS — L814 Other melanin hyperpigmentation: Secondary | ICD-10-CM | POA: Diagnosis not present

## 2015-08-22 ENCOUNTER — Other Ambulatory Visit: Payer: Self-pay | Admitting: General Surgery

## 2015-08-22 DIAGNOSIS — Q446 Cystic disease of liver: Secondary | ICD-10-CM | POA: Diagnosis not present

## 2015-08-22 NOTE — H&P (Signed)
Kim Chavez 08/22/2015 10:05 AM Location: [facility] Patient #: [id number] DOB: 08-14-1946 Married / Language: English / Race: White Female  History of Present Illness Kim Klein MD; 08/22/2015 11:00 AM) The patient is a 69 year old female who presents with a liver mass. Patient is a 69 year old female referred by Dr. Marton Chavez for evaluation of a giant liver cyst. The patient developed pneumonia several months ago. It got better but then she had recurrent pneumonia and pain in the lower right chest/upper abdomen. The pain became so bad that time and she went to the emergency department. A CT was performed which showed giant liver cyst on the right and a large but smaller cyst on the left. She had several other small cysts in the lliver that were not pressing on the capsule. The largest one on the right was elevating the right hemidiaphragm significantly causing compressive atelectasis in the right lower lobe. The patient is currently asymptomatic from a breathing standpoint. She does have some chronic discomfort in the right upper quadrant. She does have Crohn's disease which is controlled on Pentasa. She chronically has a very full feeling and complains of bloating in her abdomen. She has a personal history of breast cancer and is status post bilateral mastectomies with implant reconstruction 5 years ago at [facility].  CT abd/pelvis 07/28/2015 IMPRESSION: Multiple liver cysts. The largest cyst is seen on the right measuring approximately 16 x 15 x 14 cm. This cyst elevates the right hemidiaphragm and contributes to atelectatic change in the right base. Atelectasis in the right middle lobe medially with questionable superimposed pneumonia. This area is incompletely visualized. There is patchy atelectasis elsewhere in both lung bases. No bowel obstruction. No abscess. No bowel wall thickening or mesenteric thickening. Appendix is absent. Fat in the wall of the terminal ileum  may be due to chronic changes of Crohn's disease. No fistula seen. The surrounding mesentery in this area is normal. Status post previous mastectomies with bilateral breast implants. No adenopathy.   Other Problems Kim Chavez, CMA; 08/22/2015 10:05 AM) Anxiety Disorder Arthritis Back Pain Bladder Problems Breast Cancer Crohn's Disease Depression Gastroesophageal Reflux Disease High blood pressure Hypercholesterolemia Migraine Headache Sleep Apnea Thyroid Disease  Past Surgical History Kim Chavez, CMA; 08/22/2015 10:05 AM) Appendectomy Breast Biopsy Left. Breast Mass; Local Excision Left. Breast Reconstruction Bilateral. Mastectomy Left. Sentinel Lymph Node Biopsy  Diagnostic Studies History Kim Chavez, CMA; 08/22/2015 10:05 AM) Colonoscopy 1-5 years ago Pap Smear 1-5 years ago  Allergies Kim Chavez, CMA; 08/22/2015 10:08 AM) Tape 1"X5yd *MEDICAL DEVICES AND SUPPLIES* Codeine Phosphate *ANALGESICS - OPIOID* Penicillamine *ASSORTED CLASSES* Erythromycin Base *MACROLIDES* Neosporin *OPHTHALMIC AGENTS*  Medication History (Kim Chavez, CMA; 08/22/2015 10:07 AM) Amitriptyline HCl (75MG Tablet, Oral) Active. AmLODIPine Besylate (5MG Tablet, Oral) Active. Atenolol (50MG Tablet, Oral) Active. Fluticasone Propionate (50MCG/ACT Suspension, Nasal) Active. Levothyroxine Sodium (50MCG Tablet, Oral) Active. Nystatin (100000 UNIT/ML Suspension, Mouth/Throat) Active. Omeprazole (20MG Capsule DR, Oral) Active. Simvastatin (20MG Tablet, Oral) Active. Ventolin HFA (108 (90 Base)MCG/ACT Aerosol Soln, Inhalation) Active. Escitalopram Oxalate (10MG Tablet, Oral) Active. Hydrocodone-Acetaminophen (5-325MG Tablet, Oral as needed) Active. Medications Reconciled  Social History Kim Chavez, CMA; 08/22/2015 10:05 AM) Alcohol use Occasional alcohol use. Caffeine use Carbonated beverages. No drug use Tobacco use Former smoker.  Family History Kim Chavez, Kim Chavez; 08/22/2015 10:05 AM) Alcohol Abuse Son. Arthritis Mother. Breast Cancer Mother. Cancer Family Members In General. Cerebrovascular Accident Family Members In General. Cervical Cancer Daughter, Family Members In General. Depression Daughter, Father, Mother, Son. Diabetes Mellitus  Family Members In General, Mother. Heart Disease Father. Hypertension Family Members In General, Mother. Migraine Headache Mother. Prostate Cancer Family Members In General.  Pregnancy / Birth History Kim Chavez, CMA; 08/22/2015 10:05 AM) Age at menarche 60 years. Age of menopause 51-55 Contraceptive History Oral contraceptives. Gravida 7 Maternal age <15 Para 7  Review of Systems (Kim Chavez; 08/22/2015 10:05 AM) General Present- Fatigue. Not Present- Appetite Loss, Chills, Fever, Night Sweats, Weight Gain and Weight Loss. Skin Present- Dryness. Not Present- Change in Wart/Mole, Hives, Jaundice, New Lesions, Non-Healing Wounds, Rash and Ulcer. HEENT Present- Hearing Loss, Seasonal Allergies and Wears glasses/contact lenses. Not Present- Earache, Hoarseness, Nose Bleed, Oral Ulcers, Ringing in the Ears, Sinus Pain, Sore Throat, Visual Disturbances and Yellow Eyes. Female Genitourinary Present- Nocturia and Urgency. Not Present- Frequency, Painful Urination and Pelvic Pain. Musculoskeletal Present- Joint Pain and Muscle Pain. Not Present- Back Pain, Joint Stiffness, Muscle Weakness and Swelling of Extremities. Psychiatric Present- Anxiety. Not Present- Bipolar, Change in Sleep Pattern, Depression, Fearful and Frequent crying. Endocrine Present- Cold Intolerance. Not Present- Excessive Hunger, Hair Changes, Heat Intolerance, Hot flashes and New Diabetes.   Vitals (Kim Chavez CMA; 08/22/2015 10:06 AM) 08/22/2015 10:06 AM Weight: 164 lb Height: 63in Body Surface Area: 1.82 m Body Mass Index: 29.05 kg/m Temp.: 98.82F(Temporal)  Pulse: 79 (Regular)  BP: 118/76 (Sitting,  Left Arm, Standard)    Physical Exam Kim Klein MD; 08/22/2015 11:00 AM) General Mental Status-Alert. General Appearance-Consistent with stated age. Hydration-Well hydrated. Voice-Normal.  Head and Neck Head-normocephalic, atraumatic with no lesions or palpable masses. Trachea-midline. Thyroid Gland Characteristics - normal size and consistency.  Eye Eyeball - Bilateral-Extraocular movements intact. Sclera/Conjunctiva - Bilateral-No scleral icterus.  Chest and Lung Exam Chest and lung exam reveals -quiet, even and easy respiratory effort with no use of accessory muscles and on auscultation, normal breath sounds, no adventitious sounds and normal vocal resonance. Inspection Chest Wall - Normal. Back - normal.  Cardiovascular Cardiovascular examination reveals -normal heart sounds, regular rate and rhythm with no murmurs and normal pedal pulses bilaterally.  Abdomen Inspection Inspection of the abdomen reveals - No Hernias. Palpation/Percussion Palpation and Percussion of the abdomen reveal - Soft, Non Tender, No Rebound tenderness, No Rigidity (guarding) and No hepatosplenomegaly. Auscultation Auscultation of the abdomen reveals - Bowel sounds normal. Note: slightly bloated   Neurologic Neurologic evaluation reveals -alert and oriented x 3 with no impairment of recent or remote memory. Mental Status-Normal.  Musculoskeletal Global Assessment -Note: no gross deformities.  Normal Exam - Left-Upper Extremity Strength Normal and Lower Extremity Strength Normal. Normal Exam - Right-Upper Extremity Strength Normal and Lower Extremity Strength Normal.  Lymphatic Head & Neck  General Head & Neck Lymphatics: Bilateral - Description - Normal. Axillary  General Axillary Region: Bilateral - Description - Normal. Tenderness - Non Tender. Femoral & Inguinal  Generalized Femoral & Inguinal Lymphatics: Bilateral - Description - No Generalized  lymphadenopathy.    Assessment & Plan Kim Klein MD; 08/22/2015 11:02 AM) CYSTIC DISEASE OF LIVER (Q44.6) Impression: Patient has 2 large cysts that are causing compressive symptoms. The right one is the predominant cyst. However the left one is right over her GE junction.  I would plan for a laparoscopic liver cyst unroofing. I discussed the procedure with the patient and her husband. Diagrams of anatomy were used. I discussed the risk of bleeding, infection, damage to adjacent structures, bile leak, heart or lung complications. I discussed postoperative recovery including no lifting for 2 weeks.  The patient wishes to  proceed. I discussed that she would be in the hospital at least overnight.  They wish to go ahead and schedule surgery. Current Plans  You are being scheduled for surgery - Our schedulers will call you.  You should hear from our office's scheduling department within 5 working days about the location, date, and time of surgery. We try to make accommodations for patient's preferences in scheduling surgery, but sometimes the OR schedule or the surgeon's schedule prevents [place] from making those accommodations.  If you have not heard from our office [phone] in 5 working days, call the office and ask for your surgeon's nurse.  If you have other questions about your diagnosis, plan, or surgery, call the office and ask for your surgeon's nurse. Pt Education - [facility] Free Text Education/Instructions: discussed with patient and provided information.   Signed by Kim Klein, MD (08/22/2015 11:02 AM)

## 2015-09-02 DIAGNOSIS — K7689 Other specified diseases of liver: Secondary | ICD-10-CM | POA: Diagnosis not present

## 2015-09-02 DIAGNOSIS — K5090 Crohn's disease, unspecified, without complications: Secondary | ICD-10-CM | POA: Diagnosis not present

## 2015-09-02 DIAGNOSIS — Z86010 Personal history of colonic polyps: Secondary | ICD-10-CM | POA: Diagnosis not present

## 2015-09-10 NOTE — Patient Instructions (Addendum)
Kim Chavez  09/10/2015   Your procedure is scheduled on: 09/16/2015    Report to [facility] Main  Entrance take [facility]  elevators to 3rd floor to  [place] at      1030 AM.  Call this number if you have problems the morning of surgery [phone]   Remember: ONLY 1 PERSON MAY GO WITH YOU TO SHORT STAY TO GET  READY MORNING OF [place].  Do not eat food or drink liquids :After Midnight.             Bring tubing and mask from c-pap machine morning of surgery     Take these medicines the morning of surgery with A SIP OF WATER: Amlodipine (Norvasc), Zyrtec, Lexapro, Flonase, Levothyroxine, Prilosec DO NOT TAKE ANY DIABETIC MEDICATIONS DAY OF YOUR SURGERY                               You may not have any metal on your body including hair pins and              piercings  Do not wear jewelry, make-up, lotions, powders or perfumes, deodorant             Do not wear nail polish.  Do not shave  48 hours prior to surgery.                 Do not bring valuables to the hospital. [place].  Contacts, dentures or bridgework may not be worn into surgery.  Leave suitcase in the car. After surgery it may be brought to your room.       Special Instructions: coughing and deep breathing exercises, leg exercises               Please read over the following fact sheets you were given: _____________________________________________________________________             [facility] - Preparing for Surgery Before surgery, you can play an important role.  Because skin is not sterile, your skin needs to be as free of germs as possible.  You can reduce the number of germs on your skin by washing with CHG (chlorahexidine gluconate) soap before surgery.  CHG is an antiseptic cleaner which kills germs and bonds with the skin to continue killing germs even after washing. Please DO NOT use if you have an allergy to CHG  or antibacterial soaps.  If your skin becomes reddened/irritated stop using the CHG and inform your nurse when you arrive at Short Stay. Do not shave (including legs and underarms) for at least 48 hours prior to the first CHG shower.  You may shave your face/neck. Please follow these instructions carefully:  1.  Shower with CHG Soap the night before surgery and the  morning of Surgery.  2.  If you choose to wash your hair, wash your hair first as usual with your  normal  shampoo.  3.  After you shampoo, rinse your hair and body thoroughly to remove the  shampoo.                           4.  Use CHG as you would any  other liquid soap.  You can apply chg directly  to the skin and wash                       Gently with a scrungie or clean washcloth.  5.  Apply the CHG Soap to your body ONLY FROM THE NECK DOWN.   Do not use on face/ open                           Wound or open sores. Avoid contact with eyes, ears mouth and genitals (private parts).                       Wash face,  Genitals (private parts) with your normal soap.             6.  Wash thoroughly, paying special attention to the area where your surgery  will be performed.  7.  Thoroughly rinse your body with warm water from the neck down.  8.  DO NOT shower/wash with your normal soap after using and rinsing off  the CHG Soap.                9.  Pat yourself dry with a clean towel.            10.  Wear clean pajamas.            11.  Place clean sheets on your bed the night of your first shower and do not  sleep with pets. Day of Surgery : Do not apply any lotions/deodorants the morning of surgery.  Please wear clean clothes to the hospital/surgery center.  FAILURE TO FOLLOW THESE INSTRUCTIONS MAY RESULT IN THE CANCELLATION OF YOUR SURGERY PATIENT SIGNATURE_________________________________  NURSE SIGNATURE__________________________________  ________________________________________________________________________

## 2015-09-12 ENCOUNTER — Encounter (HOSPITAL_COMMUNITY)
Admission: RE | Admit: 2015-09-12 | Discharge: 2015-09-12 | Disposition: A | Discharge status: Home / Self Care | Payer: Medicare Other | Source: Ambulatory Visit | Attending: General Surgery | Admitting: General Surgery

## 2015-09-12 ENCOUNTER — Encounter (HOSPITAL_COMMUNITY): Payer: Self-pay

## 2015-09-12 DIAGNOSIS — K7689 Other specified diseases of liver: Secondary | ICD-10-CM | POA: Insufficient documentation

## 2015-09-12 DIAGNOSIS — Z01818 Encounter for other preprocedural examination: Secondary | ICD-10-CM | POA: Insufficient documentation

## 2015-09-12 HISTORY — DX: Depression, unspecified: F32.A

## 2015-09-12 HISTORY — DX: Pneumonia, unspecified organism: J18.9

## 2015-09-12 HISTORY — DX: Gastro-esophageal reflux disease without esophagitis: K21.9

## 2015-09-12 HISTORY — DX: Hypothyroidism, unspecified: E03.9

## 2015-09-12 HISTORY — DX: Anxiety disorder, unspecified: F41.9

## 2015-09-12 HISTORY — DX: Major depressive disorder, single episode, unspecified: F32.9

## 2015-09-12 LAB — COMPREHENSIVE METABOLIC PANEL
ALK PHOS: 142 U/L — AB (ref 38–126)
ALT: 32 U/L (ref 14–54)
AST: 28 U/L (ref 15–41)
Albumin: 3.9 g/dL (ref 3.5–5.0)
Anion gap: 3 — ABNORMAL LOW (ref 5–15)
BILIRUBIN TOTAL: 1 mg/dL (ref 0.3–1.2)
BUN: 13 mg/dL (ref 6–20)
CALCIUM: 8.8 mg/dL — AB (ref 8.9–10.3)
CO2: 32 mmol/L (ref 22–32)
CREATININE: 0.82 mg/dL (ref 0.44–1.00)
Chloride: 104 mmol/L (ref 101–111)
Glucose, Bld: 103 mg/dL — ABNORMAL HIGH (ref 65–99)
Potassium: 3.3 mmol/L — ABNORMAL LOW (ref 3.5–5.1)
Sodium: 139 mmol/L (ref 135–145)
TOTAL PROTEIN: 7 g/dL (ref 6.5–8.1)

## 2015-09-12 LAB — URINALYSIS, ROUTINE W REFLEX MICROSCOPIC
BILIRUBIN URINE: NEGATIVE
Glucose, UA: NEGATIVE mg/dL
HGB URINE DIPSTICK: NEGATIVE
KETONES UR: NEGATIVE mg/dL
Nitrite: NEGATIVE
Protein, ur: NEGATIVE mg/dL
SPECIFIC GRAVITY, URINE: 1.01 (ref 1.005–1.030)
UROBILINOGEN UA: 0.2 mg/dL (ref 0.0–1.0)
pH: 6 (ref 5.0–8.0)

## 2015-09-12 LAB — CBC WITH DIFFERENTIAL/PLATELET
Basophils Absolute: 0 10*3/uL (ref 0.0–0.1)
Basophils Relative: 1 %
Eosinophils Absolute: 0.1 10*3/uL (ref 0.0–0.7)
Eosinophils Relative: 2 %
HEMATOCRIT: 41.9 % (ref 36.0–46.0)
HEMOGLOBIN: 13.6 g/dL (ref 12.0–15.0)
LYMPHS PCT: 21 %
Lymphs Abs: 1.1 10*3/uL (ref 0.7–4.0)
MCH: 26.4 pg (ref 26.0–34.0)
MCHC: 32.5 g/dL (ref 30.0–36.0)
MCV: 81.4 fL (ref 78.0–100.0)
MONOS PCT: 8 %
Monocytes Absolute: 0.4 10*3/uL (ref 0.1–1.0)
NEUTROS PCT: 68 %
Neutro Abs: 3.4 10*3/uL (ref 1.7–7.7)
Platelets: 256 10*3/uL (ref 150–400)
RBC: 5.15 MIL/uL — AB (ref 3.87–5.11)
RDW: 13.4 % (ref 11.5–15.5)
WBC: 5 10*3/uL (ref 4.0–10.5)

## 2015-09-12 LAB — URINE MICROSCOPIC-ADD ON

## 2015-09-12 NOTE — Progress Notes (Signed)
09-12-15 - UA and Micro lab results from preop visit 0n 09-12-15 faxed to Dr. [name] via [facility]

## 2015-09-12 NOTE — Progress Notes (Addendum)
07-28-15 - 2V CXR - (increasing elevation of R hemidiaphragm) - EPIC 07-28-15 - CT Abd/Pelvis - EPIC 07-28-15 - in ED - EPIC 05-02-15 - EKG

## 2015-09-16 ENCOUNTER — Encounter (HOSPITAL_COMMUNITY)
Admission: RE | Disposition: A | Discharge status: Home / Self Care | Payer: Self-pay | Source: Ambulatory Visit | Attending: General Surgery

## 2015-09-16 ENCOUNTER — Encounter (HOSPITAL_COMMUNITY): Payer: Self-pay | Admitting: *Deleted

## 2015-09-16 ENCOUNTER — Inpatient Hospital Stay (HOSPITAL_COMMUNITY): Payer: Medicare Other | Admitting: Certified Registered Nurse Anesthetist

## 2015-09-16 ENCOUNTER — Observation Stay (HOSPITAL_COMMUNITY)
Admission: RE | Admit: 2015-09-16 | Discharge: 2015-09-18 | Disposition: A | Discharge status: Home / Self Care | Payer: Medicare Other | Source: Ambulatory Visit | Attending: General Surgery | Admitting: General Surgery

## 2015-09-16 DIAGNOSIS — Z79899 Other long term (current) drug therapy: Secondary | ICD-10-CM | POA: Insufficient documentation

## 2015-09-16 DIAGNOSIS — M797 Fibromyalgia: Secondary | ICD-10-CM | POA: Insufficient documentation

## 2015-09-16 DIAGNOSIS — K7689 Other specified diseases of liver: Secondary | ICD-10-CM | POA: Diagnosis not present

## 2015-09-16 DIAGNOSIS — F418 Other specified anxiety disorders: Secondary | ICD-10-CM | POA: Diagnosis not present

## 2015-09-16 DIAGNOSIS — E7800 Pure hypercholesterolemia, unspecified: Secondary | ICD-10-CM | POA: Diagnosis not present

## 2015-09-16 DIAGNOSIS — E785 Hyperlipidemia, unspecified: Secondary | ICD-10-CM | POA: Diagnosis not present

## 2015-09-16 DIAGNOSIS — Z853 Personal history of malignant neoplasm of breast: Secondary | ICD-10-CM | POA: Insufficient documentation

## 2015-09-16 DIAGNOSIS — Z87891 Personal history of nicotine dependence: Secondary | ICD-10-CM | POA: Diagnosis not present

## 2015-09-16 DIAGNOSIS — E039 Hypothyroidism, unspecified: Secondary | ICD-10-CM | POA: Diagnosis not present

## 2015-09-16 DIAGNOSIS — Z79891 Long term (current) use of opiate analgesic: Secondary | ICD-10-CM | POA: Insufficient documentation

## 2015-09-16 DIAGNOSIS — G4730 Sleep apnea, unspecified: Secondary | ICD-10-CM | POA: Insufficient documentation

## 2015-09-16 DIAGNOSIS — K5090 Crohn's disease, unspecified, without complications: Secondary | ICD-10-CM | POA: Diagnosis not present

## 2015-09-16 DIAGNOSIS — K219 Gastro-esophageal reflux disease without esophagitis: Secondary | ICD-10-CM | POA: Insufficient documentation

## 2015-09-16 DIAGNOSIS — I10 Essential (primary) hypertension: Secondary | ICD-10-CM | POA: Insufficient documentation

## 2015-09-16 DIAGNOSIS — Z9013 Acquired absence of bilateral breasts and nipples: Secondary | ICD-10-CM | POA: Insufficient documentation

## 2015-09-16 DIAGNOSIS — K811 Chronic cholecystitis: Secondary | ICD-10-CM | POA: Diagnosis not present

## 2015-09-16 HISTORY — PX: CHOLECYSTECTOMY: SHX55

## 2015-09-16 HISTORY — PX: LAPAROSCOPIC LIVER CYST UNROOFING: SHX5901

## 2015-09-16 LAB — TYPE AND SCREEN
ABO/RH(D): O NEG
ANTIBODY SCREEN: NEGATIVE

## 2015-09-16 LAB — ABO/RH: ABO/RH(D): O NEG

## 2015-09-16 SURGERY — UNROOFING, CYST, LIVER, LAPAROSCOPIC
Anesthesia: General | Site: Liver

## 2015-09-16 MED ORDER — LACTATED RINGERS IR SOLN
Status: DC | PRN
  Administered 2015-09-16: 1000 mL

## 2015-09-16 MED ORDER — GLYCOPYRROLATE 0.2 MG/ML IJ SOLN
INTRAMUSCULAR | Status: AC
  Filled 2015-09-16: qty 1

## 2015-09-16 MED ORDER — HYDROMORPHONE HCL 1 MG/ML IJ SOLN
0.5000 mg | INTRAMUSCULAR | Status: DC | PRN
  Administered 2015-09-16: 1 mg via INTRAVENOUS
  Filled 2015-09-16: qty 1

## 2015-09-16 MED ORDER — FENTANYL CITRATE (PF) 100 MCG/2ML IJ SOLN
INTRAMUSCULAR | Status: DC | PRN
  Administered 2015-09-16 (×4): 50 ug via INTRAVENOUS

## 2015-09-16 MED ORDER — ROCURONIUM BROMIDE 100 MG/10ML IV SOLN
INTRAVENOUS | Status: AC
  Filled 2015-09-16: qty 1

## 2015-09-16 MED ORDER — ALBUTEROL SULFATE (2.5 MG/3ML) 0.083% IN NEBU: 3.0000 mL | INHALATION_SOLUTION | Freq: Four times a day (QID) | RESPIRATORY_TRACT | Status: DC | PRN

## 2015-09-16 MED ORDER — CIPROFLOXACIN IN D5W 400 MG/200ML IV SOLN
400.0000 mg | Freq: Two times a day (BID) | INTRAVENOUS | Status: AC
  Administered 2015-09-16: 400 mg via INTRAVENOUS
  Filled 2015-09-16: qty 200

## 2015-09-16 MED ORDER — HYDROMORPHONE HCL 1 MG/ML IJ SOLN
0.2500 mg | INTRAMUSCULAR | Status: DC | PRN
  Administered 2015-09-16 (×2): 0.5 mg via INTRAVENOUS

## 2015-09-16 MED ORDER — DIPHENHYDRAMINE HCL 50 MG/ML IJ SOLN: 12.5000 mg | Freq: Four times a day (QID) | INTRAMUSCULAR | Status: DC | PRN

## 2015-09-16 MED ORDER — LACTATED RINGERS IV SOLN: INTRAVENOUS | Status: DC

## 2015-09-16 MED ORDER — BUPIVACAINE-EPINEPHRINE (PF) 0.25% -1:200000 IJ SOLN
INTRAMUSCULAR | Status: DC | PRN
  Administered 2015-09-16: 12.5 mL via PERINEURAL

## 2015-09-16 MED ORDER — PANTOPRAZOLE SODIUM 40 MG PO TBEC
40.0000 mg | DELAYED_RELEASE_TABLET | Freq: Every day | ORAL | Status: DC
  Administered 2015-09-16 – 2015-09-17 (×2): 40 mg via ORAL
  Filled 2015-09-16 (×3): qty 1

## 2015-09-16 MED ORDER — SODIUM CHLORIDE 0.9 % IJ SOLN
INTRAMUSCULAR | Status: AC
Start: 2015-09-16 — End: 2015-09-16
  Filled 2015-09-16: qty 10

## 2015-09-16 MED ORDER — ONDANSETRON HCL 4 MG/2ML IJ SOLN
INTRAMUSCULAR | Status: AC
  Filled 2015-09-16: qty 2

## 2015-09-16 MED ORDER — GLYCOPYRROLATE 0.2 MG/ML IJ SOLN
INTRAMUSCULAR | Status: AC
  Filled 2015-09-16: qty 3

## 2015-09-16 MED ORDER — LIDOCAINE HCL (CARDIAC) 20 MG/ML IV SOLN
INTRAVENOUS | Status: DC | PRN
  Administered 2015-09-16: 100 mg via INTRAVENOUS

## 2015-09-16 MED ORDER — DIAZEPAM 5 MG PO TABS: 5.0000 mg | ORAL_TABLET | Freq: Four times a day (QID) | ORAL | Status: DC | PRN

## 2015-09-16 MED ORDER — LIDOCAINE HCL (CARDIAC) 20 MG/ML IV SOLN
INTRAVENOUS | Status: AC
  Filled 2015-09-16: qty 5

## 2015-09-16 MED ORDER — DEXAMETHASONE SODIUM PHOSPHATE 10 MG/ML IJ SOLN
INTRAMUSCULAR | Status: AC
Start: 2015-09-16 — End: 2015-09-16
  Filled 2015-09-16: qty 1

## 2015-09-16 MED ORDER — LIDOCAINE HCL 1 % IJ SOLN
INTRAMUSCULAR | Status: DC | PRN
  Administered 2015-09-16: 12.5 mL

## 2015-09-16 MED ORDER — PHENYLEPHRINE 40 MCG/ML (10ML) SYRINGE FOR IV PUSH (FOR BLOOD PRESSURE SUPPORT)
PREFILLED_SYRINGE | INTRAVENOUS | Status: AC
  Filled 2015-09-16: qty 10

## 2015-09-16 MED ORDER — FENTANYL CITRATE (PF) 250 MCG/5ML IJ SOLN
INTRAMUSCULAR | Status: AC
  Filled 2015-09-16: qty 25

## 2015-09-16 MED ORDER — FLUTICASONE PROPIONATE 50 MCG/ACT NA SUSP
1.0000 | Freq: Two times a day (BID) | NASAL | Status: DC
  Administered 2015-09-16: 1 via NASAL
  Filled 2015-09-16: qty 16

## 2015-09-16 MED ORDER — 0.9 % SODIUM CHLORIDE (POUR BTL) OPTIME
TOPICAL | Status: DC | PRN
  Administered 2015-09-16: 1000 mL

## 2015-09-16 MED ORDER — NEOSTIGMINE METHYLSULFATE 10 MG/10ML IV SOLN
INTRAVENOUS | Status: DC | PRN
  Administered 2015-09-16: 5 mg via INTRAVENOUS

## 2015-09-16 MED ORDER — MIDAZOLAM HCL 2 MG/2ML IJ SOLN
INTRAMUSCULAR | Status: AC
  Filled 2015-09-16: qty 4

## 2015-09-16 MED ORDER — PROPOFOL 10 MG/ML IV BOLUS
INTRAVENOUS | Status: DC | PRN
  Administered 2015-09-16: 120 mg via INTRAVENOUS

## 2015-09-16 MED ORDER — EPHEDRINE SULFATE 50 MG/ML IJ SOLN
INTRAMUSCULAR | Status: AC
  Filled 2015-09-16: qty 1

## 2015-09-16 MED ORDER — CO Q-10 100 MG PO CAPS: 1.0000 | ORAL_CAPSULE | Freq: Every morning | ORAL | Status: DC

## 2015-09-16 MED ORDER — DIPHENHYDRAMINE HCL 12.5 MG/5ML PO ELIX: 12.5000 mg | ORAL_SOLUTION | Freq: Four times a day (QID) | ORAL | Status: DC | PRN

## 2015-09-16 MED ORDER — CIPROFLOXACIN IN D5W 400 MG/200ML IV SOLN
INTRAVENOUS | Status: AC
  Filled 2015-09-16: qty 200

## 2015-09-16 MED ORDER — GLYCOPYRROLATE 0.2 MG/ML IJ SOLN
INTRAMUSCULAR | Status: DC | PRN
  Administered 2015-09-16: 0.6 mg via INTRAVENOUS
  Administered 2015-09-16: 0.2 mg via INTRAVENOUS

## 2015-09-16 MED ORDER — ONDANSETRON HCL 4 MG/2ML IJ SOLN
4.0000 mg | Freq: Four times a day (QID) | INTRAMUSCULAR | Status: DC | PRN
  Filled 2015-09-16: qty 2

## 2015-09-16 MED ORDER — ARTIFICIAL TEARS OP OINT
TOPICAL_OINTMENT | OPHTHALMIC | Status: AC
  Filled 2015-09-16: qty 3.5

## 2015-09-16 MED ORDER — HYDROCODONE-ACETAMINOPHEN 5-325 MG PO TABS
1.0000 | ORAL_TABLET | ORAL | Status: DC | PRN
  Administered 2015-09-16: 0.5 via ORAL
  Administered 2015-09-17: 1 via ORAL
  Filled 2015-09-16 (×2): qty 1

## 2015-09-16 MED ORDER — PROMETHAZINE HCL 25 MG/ML IJ SOLN
6.2500 mg | INTRAMUSCULAR | Status: DC | PRN
Start: 2015-09-16 — End: 2015-09-16

## 2015-09-16 MED ORDER — BUPIVACAINE-EPINEPHRINE (PF) 0.25% -1:200000 IJ SOLN
INTRAMUSCULAR | Status: AC
  Filled 2015-09-16: qty 30

## 2015-09-16 MED ORDER — DEXAMETHASONE SODIUM PHOSPHATE 10 MG/ML IJ SOLN
INTRAMUSCULAR | Status: DC | PRN
  Administered 2015-09-16: 10 mg via INTRAVENOUS

## 2015-09-16 MED ORDER — MEPERIDINE HCL 50 MG/ML IJ SOLN: 6.2500 mg | INTRAMUSCULAR | Status: DC | PRN

## 2015-09-16 MED ORDER — SODIUM CHLORIDE 0.9 % IV SOLN
10.0000 mg | INTRAVENOUS | Status: DC | PRN
  Administered 2015-09-16: 25 ug/min via INTRAVENOUS

## 2015-09-16 MED ORDER — SIMVASTATIN 20 MG PO TABS
20.0000 mg | ORAL_TABLET | Freq: Every morning | ORAL | Status: DC
  Administered 2015-09-16: 20 mg via ORAL
  Filled 2015-09-16 (×3): qty 1

## 2015-09-16 MED ORDER — SIMETHICONE 80 MG PO CHEW
40.0000 mg | CHEWABLE_TABLET | Freq: Four times a day (QID) | ORAL | Status: DC | PRN
  Administered 2015-09-17: 40 mg via ORAL
  Filled 2015-09-16 (×2): qty 1

## 2015-09-16 MED ORDER — SUCCINYLCHOLINE CHLORIDE 20 MG/ML IJ SOLN
INTRAMUSCULAR | Status: DC | PRN
  Administered 2015-09-16: 100 mg via INTRAVENOUS

## 2015-09-16 MED ORDER — CIPROFLOXACIN IN D5W 400 MG/200ML IV SOLN
400.0000 mg | INTRAVENOUS | Status: AC
  Administered 2015-09-16: 400 mg via INTRAVENOUS

## 2015-09-16 MED ORDER — MIDAZOLAM HCL 5 MG/5ML IJ SOLN
INTRAMUSCULAR | Status: DC | PRN
  Administered 2015-09-16: 2 mg via INTRAVENOUS

## 2015-09-16 MED ORDER — PHENYLEPHRINE HCL 10 MG/ML IJ SOLN
INTRAMUSCULAR | Status: DC | PRN
  Administered 2015-09-16: 80 ug via INTRAVENOUS

## 2015-09-16 MED ORDER — ONDANSETRON HCL 4 MG/2ML IJ SOLN
INTRAMUSCULAR | Status: DC | PRN
  Administered 2015-09-16: 4 mg via INTRAVENOUS

## 2015-09-16 MED ORDER — ROCURONIUM BROMIDE 100 MG/10ML IV SOLN
INTRAVENOUS | Status: DC | PRN
  Administered 2015-09-16: 10 mg via INTRAVENOUS
  Administered 2015-09-16: 40 mg via INTRAVENOUS
  Administered 2015-09-16: 10 mg via INTRAVENOUS

## 2015-09-16 MED ORDER — ONDANSETRON 4 MG PO TBDP
4.0000 mg | ORAL_TABLET | Freq: Four times a day (QID) | ORAL | Status: DC | PRN
Start: 2015-09-16 — End: 2015-09-18
  Administered 2015-09-16 (×2): 4 mg via ORAL
  Filled 2015-09-16: qty 1

## 2015-09-16 MED ORDER — MESALAMINE ER 250 MG PO CPCR
1000.0000 mg | ORAL_CAPSULE | Freq: Four times a day (QID) | ORAL | Status: DC
  Administered 2015-09-16 – 2015-09-17 (×2): 1000 mg via ORAL
  Filled 2015-09-16 (×10): qty 4

## 2015-09-16 MED ORDER — HYDROMORPHONE HCL 1 MG/ML IJ SOLN
INTRAMUSCULAR | Status: AC
  Filled 2015-09-16: qty 1

## 2015-09-16 MED ORDER — KCL IN DEXTROSE-NACL 20-5-0.45 MEQ/L-%-% IV SOLN
INTRAVENOUS | Status: DC
  Administered 2015-09-16 (×2): via INTRAVENOUS
  Filled 2015-09-16 (×4): qty 1000

## 2015-09-16 MED ORDER — LIDOCAINE HCL 1 % IJ SOLN
INTRAMUSCULAR | Status: AC
  Filled 2015-09-16: qty 20

## 2015-09-16 MED ORDER — PHENYLEPHRINE HCL 10 MG/ML IJ SOLN
INTRAMUSCULAR | Status: AC
  Filled 2015-09-16: qty 1

## 2015-09-16 MED ORDER — LACTATED RINGERS IV SOLN
INTRAVENOUS | Status: DC
  Administered 2015-09-16: 1000 mL via INTRAVENOUS

## 2015-09-16 MED ORDER — PROPOFOL 10 MG/ML IV BOLUS
INTRAVENOUS | Status: AC
  Filled 2015-09-16: qty 20

## 2015-09-16 MED ORDER — LACTATED RINGERS IV SOLN
INTRAVENOUS | Status: DC
  Administered 2015-09-16: 1000 mL via INTRAVENOUS
  Administered 2015-09-16: 14:00:00 via INTRAVENOUS

## 2015-09-16 SURGICAL SUPPLY — 54 items
ADH SKN CLS APL DERMABOND .7 (GAUZE/BANDAGES/DRESSINGS) ×2
APPLIER CLIP ROT 10 11.4 M/L (STAPLE)
APR CLP MED LRG 11.4X10 (STAPLE)
BAG SPEC RTRVL LRG 6X4 10 (ENDOMECHANICALS) ×4
CABLE HIGH FREQUENCY MONO STRZ (ELECTRODE) ×2 IMPLANT
CHLORAPREP W/TINT 26ML (MISCELLANEOUS) ×4 IMPLANT
CLIP APPLIE ROT 10 11.4 M/L (STAPLE) IMPLANT
CLOSURE WOUND 1/2 X4 (GAUZE/BANDAGES/DRESSINGS)
COVER MAYO STAND STRL (DRAPES) IMPLANT
COVER SURGICAL LIGHT HANDLE (MISCELLANEOUS) ×2 IMPLANT
DECANTER SPIKE VIAL GLASS SM (MISCELLANEOUS) ×4 IMPLANT
DERMABOND ADVANCED (GAUZE/BANDAGES/DRESSINGS) ×2
DERMABOND ADVANCED .7 DNX12 (GAUZE/BANDAGES/DRESSINGS) IMPLANT
DRAIN CHANNEL 19F RND (DRAIN) IMPLANT
DRAPE LAPAROSCOPIC ABDOMINAL (DRAPES) ×4 IMPLANT
DRAPE UTILITY XL STRL (DRAPES) ×4 IMPLANT
DRESSING TELFA ISLAND 4X8 (GAUZE/BANDAGES/DRESSINGS) IMPLANT
DRSG TELFA 4X10 ISLAND STR (GAUZE/BANDAGES/DRESSINGS) IMPLANT
DRSG TELFA PLUS 4X6 ADH ISLAND (GAUZE/BANDAGES/DRESSINGS) IMPLANT
ELECT REM PT RETURN 9FT ADLT (ELECTROSURGICAL) ×4
ELECTRODE REM PT RTRN 9FT ADLT (ELECTROSURGICAL) ×2 IMPLANT
EVACUATOR SILICONE 100CC (DRAIN) IMPLANT
GLOVE BIO SURGEON STRL SZ 6 (GLOVE) ×4 IMPLANT
GLOVE INDICATOR 6.5 STRL GRN (GLOVE) ×4 IMPLANT
GOWN STRL REUS W/ TWL XL LVL3 (GOWN DISPOSABLE) ×6 IMPLANT
GOWN STRL REUS W/TWL XL LVL3 (GOWN DISPOSABLE) ×12
HEMOSTAT SNOW SURGICEL 2X4 (HEMOSTASIS) ×4 IMPLANT
KIT BASIN OR (CUSTOM PROCEDURE TRAY) ×4 IMPLANT
LIQUID BAND (GAUZE/BANDAGES/DRESSINGS) IMPLANT
POUCH SPECIMEN RETRIEVAL 10MM (ENDOMECHANICALS) ×4 IMPLANT
RELOAD STAPLE 60 2.6 WHT THN (STAPLE) IMPLANT
RELOAD STAPLER WHITE 60MM (STAPLE) ×20 IMPLANT
SET IRRIG TUBING LAPAROSCOPIC (IRRIGATION / IRRIGATOR) ×4 IMPLANT
SHEARS HARMONIC ACE PLUS 36CM (ENDOMECHANICALS) ×2 IMPLANT
SLEEVE XCEL OPT CAN 5 100 (ENDOMECHANICALS) ×12 IMPLANT
SOLUTION ANTI FOG 6CC (MISCELLANEOUS) ×4 IMPLANT
SPONGE DRAIN TRACH 4X4 STRL 2S (GAUZE/BANDAGES/DRESSINGS) IMPLANT
SPONGE LAP 18X18 X RAY DECT (DISPOSABLE) IMPLANT
STAPLE ECHEON FLEX 60 POW ENDO (STAPLE) ×2 IMPLANT
STAPLER RELOAD WHITE 60MM (STAPLE) ×40
STAPLER VISISTAT 35W (STAPLE) IMPLANT
STRIP CLOSURE SKIN 1/2X4 (GAUZE/BANDAGES/DRESSINGS) IMPLANT
SUT MNCRL AB 4-0 PS2 18 (SUTURE) ×4 IMPLANT
SUT PROLENE 5 0 CC 1 (SUTURE) IMPLANT
SUT VICRYL 0 UR6 27IN ABS (SUTURE) ×2 IMPLANT
TOWEL OR 17X26 10 PK STRL BLUE (TOWEL DISPOSABLE) ×4 IMPLANT
TOWEL OR NON WOVEN STRL DISP B (DISPOSABLE) ×4 IMPLANT
TRAP SPECIMEN MUCOUS 40CC (MISCELLANEOUS) ×2 IMPLANT
TRAY LAPAROSCOPIC (CUSTOM PROCEDURE TRAY) ×4 IMPLANT
TROCAR BLADELESS OPT 5 100 (ENDOMECHANICALS) ×4 IMPLANT
TROCAR XCEL 12X100 BLDLESS (ENDOMECHANICALS) ×2 IMPLANT
TROCAR XCEL BLUNT TIP 100MML (ENDOMECHANICALS) ×2 IMPLANT
TROCAR XCEL NON-BLD 11X100MML (ENDOMECHANICALS) ×2 IMPLANT
TUBING INSUFFLATION 10FT LAP (TUBING) ×4 IMPLANT

## 2015-09-16 NOTE — Anesthesia Postprocedure Evaluation (Signed)
  Anesthesia Post-op Note  Patient: Kim Chavez  Procedure(s) Performed: Procedure(s): LAPAROSCOPIC LIVER CYST UNROOFING X'S 2 (N/A) CHOLECYSTECTOMY (N/A)  Patient Location: PACU  Anesthesia Type:General  Level of Consciousness: awake, alert  and oriented  Airway and Oxygen Therapy: Patient Spontanous Breathing  Post-op Pain: mild  Post-op Assessment: Post-op Vital signs reviewed and Patient's Cardiovascular Status Stable              Post-op Vital Signs: Reviewed and stable  Last Vitals:  Filed Vitals:   09/16/15 1558  BP: 119/61  Pulse: 69  Temp: 36.3 C  Resp: 14    Complications: No apparent anesthesia complications

## 2015-09-16 NOTE — Procedures (Signed)
Pt placed on CPAP with automode setting (max-20, min-5).  Pt is comfortable and resting well.

## 2015-09-16 NOTE — Op Note (Signed)
Author: [name], MD Service: Surgery Author Type: Physician      Status: Signed   Editor: [name], MD (Physician)      PRE-OPERATIVE DIAGNOSIS: liver cysts symptomatic  POST-OPERATIVE DIAGNOSIS: Same  PROCEDURE: Procedure(s): Laparoscopic liver cyst unroofing x 2 separate areas (marsupialization or fenestration), laparoscopic cholecystectomy  SURGEON: Surgeon(s): [name], MD  ASSISTANT:   Frederich Cha, MD  ANESTHESIA: local and general  DRAINS: none   LOCAL MEDICATIONS USED: BUPIVICAINE and LIDOCAINE   SPECIMEN: Source of Specimen: liver cyst wall left, right liver cyst wall.  Gallbladder.  Liver cyst fluid  DISPOSITION OF SPECIMEN: PATHOLOGY  COUNTS: YES  DICTATION: .Dragon Dictation  PLAN OF CARE: Admit to inpatient   PATIENT DISPOSITION: PACU - hemodynamically stable.  FINDINGS: Large liver cyst on right.  Moderate size liver cyst on left.  Chronic inflammation of the gallbladder.  Significant adhesion to the diaphragm of the right cyst.  Generous left lateral segment.    EBL: min  PROCEDURE:   Pt was identified in the holding area and taken to the OR where she was placed supine on the operating room table. General anesthesia was induced. The patient's abdomen was prepped and draped in sterile fashion. A timeout was performed according to the surgical safety checklist. When all was correct, we continued.   The supraumbilical skin was infiltrated with local anesthetic. A curvilinear incision was made with the #11 blade in the location of her prior incision. A kelly clamp was used to spread the subcutaneous skin. Two Kocher clamps were used to elevate the fascia. The #11 blade was used to incise the fascia. A 0-0 vicryl pursestring suture was placed into the fascia. The hasson trocar was advanced into the abdomen and pneumoperitoneum was achieved to a pressure of 15 mm Hg.   Several additional 5 mm trocars were placed in the  right mid abdomen and left abdomen.  A 12 mm port was placed in the epigastric region. The left sided cyst was identified. The harmonic scalpel was used to enter the cyst. The fluid was suctioned out. The harmonic scalpel was used to take off the top of the cyst. SNOW hemostatic agent was placed against the posterior cyst wall. The cyst wall came out easily from the 12 mm trocar.  Hemostasis was achieved with cautery. A 4 quadrant inspection was negative for evidence of bleeding or gross pathology.   The right sided cyst was then addressed.  The 20 mm cyst was entered on the lower part of the cyst.  The contents were aspirated.  Some of this was sent for cytology.  The liver cyst wall was then stapled off with sequential white loads of the echelon stapler.  The cyst was extremely adherent to the diaphragm at the dome of the liver.  This was taken down with blunt dissection and cautery.  The wall was then placed into an endocatch bag and removed via the epigastric port.  The fascial incision had to be stretched to remove the bag.    The gallbladder was then addressed.  The gallbladder appeared to have chronic inflammation.  The fundus was grasped and elevated toward the head.  The infundibulum was retracted laterally.  The cystic duct and artery were very small and were dissected out easily with the maryland grasper.  A critical view was obtained.  The artery was clipped and divided.  The cystic duct was triply clipped on the patient side and singly clipped on the specimen side.  The cystic  duct was then divided.  The gallbladder was retrieved from the abdomen in an endocatch bag from the umbilical port.    The 5 mm trocars were removed. The pneumoperitoneum was evacuated.The 12 mm defect The pursestring suture was tied down. There was no residual palpable fascial defect. The skin was closed with 4-0 monocryl in subcuticular fashion. Needle, sponge, and instrument counts were correct times 2.    The patient was taken to the PACU in stable condition.

## 2015-09-16 NOTE — Progress Notes (Signed)
PHARMACIST - PHYSICIAN ORDER COMMUNICATION  CONCERNING: P&T Medication Policy on Herbal Medications  DESCRIPTION:  This patient's order for: Co Q-10   has been noted.  This product(s) is classified as an "herbal" or natural product. Due to a lack of definitive safety studies or FDA approval, nonstandard manufacturing practices, plus the potential risk of unknown drug-drug interactions while on inpatient medications, the Pharmacy and Therapeutics Committee does not permit the use of "herbal" or natural products of this type within [facility].   ACTION TAKEN: The pharmacy department is unable to verify this order at this time and your patient has been informed of this safety policy. Please reevaluate patient's clinical condition at discharge and address if the herbal or natural product(s) should be resumed at that time.  Thanks Dorrene German 09/16/2015 4:15 PM

## 2015-09-16 NOTE — H&P (View-Only) (Signed)
Kim Chavez 08/22/2015 10:05 AM Location: [facility] Patient #: [id number] DOB: [birth year] Married / Language: English / Race: White Female  History of Present Illness [name] MD; 08/22/2015 11:00 AM) The patient is a 69 year old female who presents with a liver mass. Patient is a 69 year old female referred by Dr. Marton Redwood for evaluation of a giant liver cyst. The patient developed pneumonia several months ago. It got better but then she had recurrent pneumonia and pain in the lower right chest/upper abdomen. The pain became so bad that time and she went to the emergency department. A CT was performed which showed giant liver cyst on the right and a large but smaller cyst on the left. She had several other small cysts in the lliver that were not pressing on the capsule. The largest one on the right was elevating the right hemidiaphragm significantly causing compressive atelectasis in the right lower lobe. The patient is currently asymptomatic from a breathing standpoint. She does have some chronic discomfort in the right upper quadrant. She does have Crohn's disease which is controlled on Pentasa. She chronically has a very full feeling and complains of bloating in her abdomen. She has a personal history of breast cancer and is status post bilateral mastectomies with implant reconstruction 5 years ago at [facility].  CT abd/pelvis 07/28/2015 IMPRESSION: Multiple liver cysts. The largest cyst is seen on the right measuring approximately 16 x 15 x 14 cm. This cyst elevates the right hemidiaphragm and contributes to atelectatic change in the right base. Atelectasis in the right middle lobe medially with questionable superimposed pneumonia. This area is incompletely visualized. There is patchy atelectasis elsewhere in both lung bases. No bowel obstruction. No abscess. No bowel wall thickening or mesenteric thickening. Appendix is absent. Fat in the wall of the terminal ileum  may be due to chronic changes of Crohn's disease. No fistula seen. The surrounding mesentery in this area is normal. Status post previous mastectomies with bilateral breast implants. No adenopathy.   Other Problems Marjean Donna, CMA; 08/22/2015 10:05 AM) Anxiety Disorder Arthritis Back Pain Bladder Problems Breast Cancer Crohn's Disease Depression Gastroesophageal Reflux Disease High blood pressure Hypercholesterolemia Migraine Headache Sleep Apnea Thyroid Disease  Past Surgical History Marjean Donna, CMA; 08/22/2015 10:05 AM) Appendectomy Breast Biopsy Left. Breast Mass; Local Excision Left. Breast Reconstruction Bilateral. Mastectomy Left. Sentinel Lymph Node Biopsy  Diagnostic Studies History Marjean Donna, CMA; 08/22/2015 10:05 AM) Colonoscopy 1-5 years ago Pap Smear 1-5 years ago  Allergies Davy Pique Bynum, CMA; 08/22/2015 10:08 AM) Tape 1"X5yd *MEDICAL DEVICES AND SUPPLIES* Codeine Phosphate *ANALGESICS - OPIOID* Penicillamine *ASSORTED CLASSES* Erythromycin Base *MACROLIDES* Neosporin *OPHTHALMIC AGENTS*  Medication History ([name], CMA; 08/22/2015 10:07 AM) Amitriptyline HCl (75MG Tablet, Oral) Active. AmLODIPine Besylate (5MG Tablet, Oral) Active. Atenolol (50MG Tablet, Oral) Active. Fluticasone Propionate (50MCG/ACT Suspension, Nasal) Active. Levothyroxine Sodium (50MCG Tablet, Oral) Active. Nystatin (100000 UNIT/ML Suspension, Mouth/Throat) Active. Omeprazole (20MG Capsule DR, Oral) Active. Simvastatin (20MG Tablet, Oral) Active. Ventolin HFA (108 (90 Base)MCG/ACT Aerosol Soln, Inhalation) Active. Escitalopram Oxalate (10MG Tablet, Oral) Active. Hydrocodone-Acetaminophen (5-325MG Tablet, Oral as needed) Active. Medications Reconciled  Social History Marjean Donna, CMA; 08/22/2015 10:05 AM) Alcohol use Occasional alcohol use. Caffeine use Carbonated beverages. No drug use Tobacco use Former smoker.  Family History Marjean Donna, [place]; 08/22/2015 10:05 AM) Alcohol Abuse Son. Arthritis Mother. Breast Cancer Mother. Cancer Family Members In General. Cerebrovascular Accident Family Members In General. Cervical Cancer Daughter, Family Members In General. Depression Daughter, Father, Mother, Son. Diabetes Mellitus  Family Members In General, Mother. Heart Disease Father. Hypertension Family Members In General, Mother. Migraine Headache Mother. Prostate Cancer Family Members In General.  Pregnancy / Birth History Marjean Donna, CMA; 08/22/2015 10:05 AM) Age at menarche 48 years. Age of menopause 51-55 Contraceptive History Oral contraceptives. Gravida 7 Maternal age <15 Para 7  Review of Systems ([place]; 08/22/2015 10:05 AM) General Present- Fatigue. Not Present- Appetite Loss, Chills, Fever, Night Sweats, Weight Gain and Weight Loss. Skin Present- Dryness. Not Present- Change in Wart/Mole, Hives, Jaundice, New Lesions, Non-Healing Wounds, Rash and Ulcer. HEENT Present- Hearing Loss, Seasonal Allergies and Wears glasses/contact lenses. Not Present- Earache, Hoarseness, Nose Bleed, Oral Ulcers, Ringing in the Ears, Sinus Pain, Sore Throat, Visual Disturbances and Yellow Eyes. Female Genitourinary Present- Nocturia and Urgency. Not Present- Frequency, Painful Urination and Pelvic Pain. Musculoskeletal Present- Joint Pain and Muscle Pain. Not Present- Back Pain, Joint Stiffness, Muscle Weakness and Swelling of Extremities. Psychiatric Present- Anxiety. Not Present- Bipolar, Change in Sleep Pattern, Depression, Fearful and Frequent crying. Endocrine Present- Cold Intolerance. Not Present- Excessive Hunger, Hair Changes, Heat Intolerance, Hot flashes and New Diabetes.   Vitals ([name] CMA; 08/22/2015 10:06 AM) 08/22/2015 10:06 AM Weight: 164 lb Height: 63in Body Surface Area: 1.82 m Body Mass Index: 29.05 kg/m Temp.: 98.2F(Temporal)  Pulse: 79 (Regular)  BP: 118/76 (Sitting,  Left Arm, Standard)    Physical Exam [name] MD; 08/22/2015 11:00 AM) General Mental Status-Alert. General Appearance-Consistent with stated age. Hydration-Well hydrated. Voice-Normal.  Head and Neck Head-normocephalic, atraumatic with no lesions or palpable masses. Trachea-midline. Thyroid Gland Characteristics - normal size and consistency.  Eye Eyeball - Bilateral-Extraocular movements intact. Sclera/Conjunctiva - Bilateral-No scleral icterus.  Chest and Lung Exam Chest and lung exam reveals -quiet, even and easy respiratory effort with no use of accessory muscles and on auscultation, normal breath sounds, no adventitious sounds and normal vocal resonance. Inspection Chest Wall - Normal. Back - normal.  Cardiovascular Cardiovascular examination reveals -normal heart sounds, regular rate and rhythm with no murmurs and normal pedal pulses bilaterally.  Abdomen Inspection Inspection of the abdomen reveals - No Hernias. Palpation/Percussion Palpation and Percussion of the abdomen reveal - Soft, Non Tender, No Rebound tenderness, No Rigidity (guarding) and No hepatosplenomegaly. Auscultation Auscultation of the abdomen reveals - Bowel sounds normal. Note: slightly bloated   Neurologic Neurologic evaluation reveals -alert and oriented x 3 with no impairment of recent or remote memory. Mental Status-Normal.  Musculoskeletal Global Assessment -Note: no gross deformities.  Normal Exam - Left-Upper Extremity Strength Normal and Lower Extremity Strength Normal. Normal Exam - Right-Upper Extremity Strength Normal and Lower Extremity Strength Normal.  Lymphatic Head & Neck  General Head & Neck Lymphatics: Bilateral - Description - Normal. Axillary  General Axillary Region: Bilateral - Description - Normal. Tenderness - Non Tender. Femoral & Inguinal  Generalized Femoral & Inguinal Lymphatics: Bilateral - Description - No Generalized  lymphadenopathy.    Assessment & Plan [name] MD; 08/22/2015 11:02 AM) CYSTIC DISEASE OF LIVER (Q44.6) Impression: Patient has 2 large cysts that are causing compressive symptoms. The right one is the predominant cyst. However the left one is right over her GE junction.  I would plan for a laparoscopic liver cyst unroofing. I discussed the procedure with the patient and her husband. Diagrams of anatomy were used. I discussed the risk of bleeding, infection, damage to adjacent structures, bile leak, heart or lung complications. I discussed postoperative recovery including no lifting for 2 weeks.  The patient wishes to  proceed. I discussed that she would be in the hospital at least overnight.  They wish to go ahead and schedule surgery. Current Plans  You are being scheduled for surgery - Our schedulers will call you.  You should hear from our office's scheduling department within 5 working days about the location, date, and time of surgery. We try to make accommodations for patient's preferences in scheduling surgery, but sometimes the OR schedule or the surgeon's schedule prevents [place] from making those accommodations.  If you have not heard from our office [phone]) in 5 working days, call the office and ask for your surgeon's nurse.  If you have other questions about your diagnosis, plan, or surgery, call the office and ask for your surgeon's nurse. Pt Education - [facility] Free Text Education/Instructions: discussed with patient and provided information.   Signed by [name], MD (08/22/2015 11:02 AM)

## 2015-09-16 NOTE — Transfer of Care (Signed)
Immediate Anesthesia Transfer of Care Note  Patient: Kim Chavez  Procedure(s) Performed: Procedure(s): LAPAROSCOPIC LIVER CYST UNROOFING X'S 2 (N/A) CHOLECYSTECTOMY (N/A)  Patient Location: PACU  Anesthesia Type:General  Level of Consciousness:  sedated, patient cooperative and responds to stimulation  Airway & Oxygen Therapy:Patient Spontanous Breathing and Patient connected to face mask oxgen  Post-op Assessment:  Report given to PACU RN and Post -op Vital signs reviewed and stable  Post vital signs:  Reviewed and stable  Last Vitals:  Filed Vitals:   09/16/15 1013  BP: 137/64  Pulse: 70  Temp: 36.7 C  Resp: 16    Complications: No apparent anesthesia complications

## 2015-09-16 NOTE — Anesthesia Procedure Notes (Signed)
Procedure Name: Intubation Date/Time: 09/16/2015 12:18 PM Performed by: Maxwell Caul Pre-anesthesia Checklist: Patient identified, Emergency Drugs available, Suction available and Patient being monitored Patient Re-evaluated:Patient Re-evaluated prior to inductionOxygen Delivery Method: Circle System Utilized Preoxygenation: Pre-oxygenation with 100% oxygen Intubation Type: IV induction Ventilation: Mask ventilation without difficulty Laryngoscope Size: Mac and 4 Grade View: Grade I Tube type: Oral Number of attempts: 1 Airway Equipment and Method: Stylet Placement Confirmation: ETT inserted through vocal cords under direct vision,  positive ETCO2 and breath sounds checked- equal and bilateral Secured at: 21 cm Tube secured with: Tape Dental Injury: Teeth and Oropharynx as per pre-operative assessment

## 2015-09-16 NOTE — Interval H&P Note (Signed)
History and Physical Interval Note:  09/16/2015 11:40 AM  Kim Chavez  has presented today for surgery, with the diagnosis of LIVER CYST X'S 2  The various methods of treatment have been discussed with the patient and family. After consideration of risks, benefits and other options for treatment, the patient has consented to  Procedure(s): LAPAROSCOPIC LIVER CYST UNROOFING X'S 2 (N/A) as a surgical intervention .  The patient's history has been reviewed, patient examined, no change in status, stable for surgery.  I have reviewed the patient's chart and labs.  Questions were answered to the patient's satisfaction.     Vishruth Seoane

## 2015-09-16 NOTE — Anesthesia Preprocedure Evaluation (Addendum)
Anesthesia Evaluation  Patient identified by MRN, date of birth, ID band Patient awake    Reviewed: Allergy & Precautions, NPO status , Patient's Chart, lab work & pertinent test results, reviewed documented beta blocker date and time   Airway Mallampati: III  TM Distance: >3 FB Neck ROM: Full    Dental  (+) Teeth Intact   Pulmonary sleep apnea and Continuous Positive Airway Pressure Ventilation , former smoker,    breath sounds clear to auscultation       Cardiovascular hypertension, Pt. on medications and Pt. on home beta blockers  Rhythm:Regular Rate:Normal     Neuro/Psych  Headaches, PSYCHIATRIC DISORDERS Anxiety Depression  Neuromuscular disease    GI/Hepatic Neg liver ROS, GERD  Medicated,  Endo/Other  Hypothyroidism   Renal/GU negative Renal ROS  negative genitourinary   Musculoskeletal  (+) Fibromyalgia -  Abdominal   Peds negative pediatric ROS (+)  Hematology   Anesthesia Other Findings   Reproductive/Obstetrics                            Lab Results  Component Value Date   WBC 5.0 09/12/2015   HGB 13.6 09/12/2015   HCT 41.9 09/12/2015   MCV 81.4 09/12/2015   PLT 256 09/12/2015   Lab Results  Component Value Date   CREATININE 0.82 09/12/2015   BUN 13 09/12/2015   NA 139 09/12/2015   K 3.3* 09/12/2015   CL 104 09/12/2015   CO2 32 09/12/2015   No results found for: INR, PROTIME  EKG: normal sinus rhythm.  Anesthesia Physical Anesthesia Plan  ASA: III  Anesthesia Plan: General   Post-op Pain Management:    Induction: Intravenous  Airway Management Planned: Oral ETT  Additional Equipment:   Intra-op Plan:   Post-operative Plan: Extubation in OR  Informed Consent: I have reviewed the patients History and Physical, chart, labs and discussed the procedure including the risks, benefits and alternatives for the proposed anesthesia with the patient or authorized  representative who has indicated his/her understanding and acceptance.   Dental advisory given  Plan Discussed with: CRNA  Anesthesia Plan Comments:         Anesthesia Quick Evaluation

## 2015-09-17 ENCOUNTER — Encounter (HOSPITAL_COMMUNITY): Payer: Self-pay | Admitting: General Surgery

## 2015-09-17 DIAGNOSIS — I10 Essential (primary) hypertension: Secondary | ICD-10-CM | POA: Diagnosis not present

## 2015-09-17 DIAGNOSIS — E039 Hypothyroidism, unspecified: Secondary | ICD-10-CM | POA: Diagnosis not present

## 2015-09-17 DIAGNOSIS — K811 Chronic cholecystitis: Secondary | ICD-10-CM | POA: Diagnosis not present

## 2015-09-17 DIAGNOSIS — K7689 Other specified diseases of liver: Secondary | ICD-10-CM | POA: Diagnosis not present

## 2015-09-17 DIAGNOSIS — K219 Gastro-esophageal reflux disease without esophagitis: Secondary | ICD-10-CM | POA: Diagnosis not present

## 2015-09-17 DIAGNOSIS — K5090 Crohn's disease, unspecified, without complications: Secondary | ICD-10-CM | POA: Diagnosis not present

## 2015-09-17 LAB — BASIC METABOLIC PANEL
ANION GAP: 8 (ref 5–15)
BUN: 10 mg/dL (ref 6–20)
CO2: 27 mmol/L (ref 22–32)
Calcium: 8.7 mg/dL — ABNORMAL LOW (ref 8.9–10.3)
Chloride: 103 mmol/L (ref 101–111)
Creatinine, Ser: 0.67 mg/dL (ref 0.44–1.00)
GFR calc Af Amer: >60 mL/min (ref >60)
GFR calc non Af Amer: >60 mL/min (ref >60)
GLUCOSE: 186 mg/dL — AB (ref 65–99)
POTASSIUM: 3.8 mmol/L (ref 3.5–5.1)
Sodium: 138 mmol/L (ref 135–145)

## 2015-09-17 LAB — CBC
HEMATOCRIT: 36.2 % (ref 36.0–46.0)
Hemoglobin: 12.2 g/dL (ref 12.0–15.0)
MCH: 27.1 pg (ref 26.0–34.0)
MCHC: 33.7 g/dL (ref 30.0–36.0)
MCV: 80.3 fL (ref 78.0–100.0)
PLATELETS: 213 10*3/uL (ref 150–400)
RBC: 4.51 MIL/uL (ref 3.87–5.11)
RDW: 13.4 % (ref 11.5–15.5)
WBC: 11.4 10*3/uL — AB (ref 4.0–10.5)

## 2015-09-17 LAB — MAGNESIUM: Magnesium: 1.5 mg/dL — ABNORMAL LOW (ref 1.7–2.4)

## 2015-09-17 LAB — PHOSPHORUS: Phosphorus: 1.5 mg/dL — ABNORMAL LOW (ref 2.5–4.6)

## 2015-09-17 LAB — PROTIME-INR
INR: 1.09 (ref 0.00–1.49)
PROTHROMBIN TIME: 14.3 s (ref 11.6–15.2)

## 2015-09-17 NOTE — Progress Notes (Signed)
1 Day Post-Op  Subjective: Having a fair amount of pain.  No nausea.    Objective: Vital signs in last 24 hours: Temp:  [97.3 F (36.3 C)-98.4 F (36.9 C)] 98.1 F (36.7 C) (10/05 0952) Pulse Rate:  [60-81] 73 (10/05 0952) Resp:  [12-19] 18 (10/05 0952) BP: (86-137)/(49-66) 120/56 mmHg (10/05 0952) SpO2:  [93 %-97 %] 96 % (10/05 0952) Weight:  [74.39 kg (164 lb)] 74.39 kg (164 lb) (10/04 1043) Last BM Date: 09/15/15  Intake/Output from previous day: 10/04 0701 - 10/05 0700 In: 3200 [I.V.:3200] Out: 2450 [Urine:2400; Blood:50] Intake/Output this shift: Total I/O In: 25 [P.O.:25] Out: 1000 [Urine:1000]  General appearance: alert, cooperative and no distress Resp: breathing comfortably Cardio: regular rate and rhythm GI: soft, approp tender.  Lab Results:   Recent Labs  09/17/15 0525  WBC 11.4*  HGB 12.2  HCT 36.2  PLT 213   BMET  Recent Labs  09/17/15 0525  NA 138  K 3.8  CL 103  CO2 27  GLUCOSE 186*  BUN 10  CREATININE 0.67  CALCIUM 8.7*   PT/INR  Recent Labs  09/17/15 0525  LABPROT 14.3  INR 1.09   ABG No results for input(s): PHART, HCO3 in the last 72 hours.  Invalid input(s): PCO2, PO2  Studies/Results: No results found.  Anti-infectives: Anti-infectives    Start     Dose/Rate Route Frequency Ordered Stop   09/16/15 2359  ciprofloxacin (CIPRO) IVPB 400 mg     400 mg 200 mL/hr over 60 Minutes Intravenous Every 12 hours 09/16/15 1606 09/17/15 0003   09/16/15 1006  ciprofloxacin (CIPRO) IVPB 400 mg     400 mg 200 mL/hr over 60 Minutes Intravenous On call to O.R. 09/16/15 1006 09/16/15 1225      Assessment/Plan: s/p Procedure(s): LAPAROSCOPIC LIVER CYST UNROOFING X'S 2 (N/A) CHOLECYSTECTOMY (N/A) Advance diet pulmonary toilet  High risk for development of pleural effusion given cyst wall was very adherent to diaphragm.   Ambulate.   Pain control.  LOS: 1 day    [facility] 09/17/2015

## 2015-09-18 DIAGNOSIS — K5090 Crohn's disease, unspecified, without complications: Secondary | ICD-10-CM | POA: Diagnosis not present

## 2015-09-18 DIAGNOSIS — E039 Hypothyroidism, unspecified: Secondary | ICD-10-CM | POA: Diagnosis not present

## 2015-09-18 DIAGNOSIS — K7689 Other specified diseases of liver: Secondary | ICD-10-CM | POA: Diagnosis not present

## 2015-09-18 DIAGNOSIS — I10 Essential (primary) hypertension: Secondary | ICD-10-CM | POA: Diagnosis not present

## 2015-09-18 DIAGNOSIS — K811 Chronic cholecystitis: Secondary | ICD-10-CM | POA: Diagnosis not present

## 2015-09-18 DIAGNOSIS — K219 Gastro-esophageal reflux disease without esophagitis: Secondary | ICD-10-CM | POA: Diagnosis not present

## 2015-09-18 LAB — BASIC METABOLIC PANEL
Anion gap: 6 (ref 5–15)
BUN: 10 mg/dL (ref 6–20)
CO2: 31 mmol/L (ref 22–32)
Calcium: 8.5 mg/dL — ABNORMAL LOW (ref 8.9–10.3)
Chloride: 102 mmol/L (ref 101–111)
Creatinine, Ser: 0.75 mg/dL (ref 0.44–1.00)
GFR calc Af Amer: >60 mL/min (ref >60)
GLUCOSE: 238 mg/dL — AB (ref 65–99)
POTASSIUM: 4.3 mmol/L (ref 3.5–5.1)
Sodium: 139 mmol/L (ref 135–145)

## 2015-09-18 LAB — CBC
HCT: 35.8 % — ABNORMAL LOW (ref 36.0–46.0)
Hemoglobin: 11.6 g/dL — ABNORMAL LOW (ref 12.0–15.0)
MCH: 26.9 pg (ref 26.0–34.0)
MCHC: 32.4 g/dL (ref 30.0–36.0)
MCV: 83.1 fL (ref 78.0–100.0)
PLATELETS: 232 10*3/uL (ref 150–400)
RBC: 4.31 MIL/uL (ref 3.87–5.11)
RDW: 14 % (ref 11.5–15.5)
WBC: 11.1 10*3/uL — ABNORMAL HIGH (ref 4.0–10.5)

## 2015-09-18 MED ORDER — HYDROCODONE-ACETAMINOPHEN 5-325 MG PO TABS: 1.0000 | ORAL_TABLET | Freq: Four times a day (QID) | ORAL | Status: DC | PRN

## 2015-09-18 MED ORDER — HYDROCODONE-ACETAMINOPHEN 5-325 MG PO TABS: 1.0000 | ORAL_TABLET | ORAL | Status: DC | PRN

## 2015-09-18 NOTE — Progress Notes (Signed)
Assessment unchanged. Pt and husband verbalized understanding of dc instructions through teach back regarding follow up care, medications and when to call the doctor. Script x 1 given to pt as provided by MD. Discharged via wc to front entrance to meet awaiting vehicle to carry home. Accompanied by husband and IT sales professional.

## 2015-09-18 NOTE — Progress Notes (Signed)
Patient ID: Kim Chavez, female   DOB: [birth year], 69 y.o.   MRN: [id number] 2 Days Post-Op  Subjective: Doing better.  Less shortness of breath.  Did well with cpap overnight.    Objective: Vital signs in last 24 hours: Temp:  [98.1 F (36.7 C)-98.7 F (37.1 C)] 98.6 F (37 C) (10/06 0543) Pulse Rate:  [65-86] 67 (10/06 0543) Resp:  [15-19] 19 (10/06 0543) BP: (116-141)/(53-65) 118/57 mmHg (10/06 0543) SpO2:  [88 %-99 %] 99 % (10/06 0543) Last BM Date: 09/15/15  Intake/Output from previous day: 10/05 0701 - 10/06 0700 In: 2091.7 [P.O.:625; I.V.:1466.7] Out: 2950 [Urine:2950] Intake/Output this shift:    General appearance: alert, cooperative and no distress Resp: breathing comfortably Cardio: regular rate and rhythm GI: soft, approp tender.  Lab Results:   Recent Labs  09/17/15 0525 09/18/15 0531  WBC 11.4* 11.1*  HGB 12.2 11.6*  HCT 36.2 35.8*  PLT 213 232   BMET  Recent Labs  09/17/15 0525 09/18/15 0531  NA 138 139  K 3.8 4.3  CL 103 102  CO2 27 31  GLUCOSE 186* 238*  BUN 10 10  CREATININE 0.67 0.75  CALCIUM 8.7* 8.5*   PT/INR  Recent Labs  09/17/15 0525  LABPROT 14.3  INR 1.09   ABG No results for input(s): PHART, HCO3 in the last 72 hours.  Invalid input(s): PCO2, PO2  Studies/Results: No results found.  Anti-infectives: Anti-infectives    Start     Dose/Rate Route Frequency Ordered Stop   09/16/15 2359  ciprofloxacin (CIPRO) IVPB 400 mg     400 mg 200 mL/hr over 60 Minutes Intravenous Every 12 hours 09/16/15 1606 09/17/15 0003   09/16/15 1006  ciprofloxacin (CIPRO) IVPB 400 mg     400 mg 200 mL/hr over 60 Minutes Intravenous On call to O.R. 09/16/15 1006 09/16/15 1225      Assessment/Plan: s/p Procedure(s): LAPAROSCOPIC LIVER CYST UNROOFING X'S 2 (N/A) CHOLECYSTECTOMY (N/A)  pulmonary toilet  High risk for development of pleural effusion given cyst wall was very adherent to diaphragm.   Ambulate.   Pain  control. Home today.     LOS: 2 days    Lillyian Heidt 09/18/2015

## 2015-09-18 NOTE — Discharge Instructions (Signed)
[facility] Office Phone Number [phone]   POST OP INSTRUCTIONS  Always review your discharge instruction sheet given to you by the facility where your surgery was performed.  IF YOU HAVE DISABILITY OR FAMILY LEAVE FORMS, YOU MUST BRING THEM TO THE OFFICE FOR PROCESSING.  DO NOT GIVE THEM TO YOUR DOCTOR.  1. A prescription for pain medication may be given to you upon discharge.  Take your pain medication as prescribed, if needed.  If narcotic pain medicine is not needed, then you may take acetaminophen (Tylenol) or ibuprofen (Advil) as needed. 2. Take your usually prescribed medications unless otherwise directed 3. If you need a refill on your pain medication, please contact your pharmacy.  They will contact our office to request authorization.  Prescriptions will not be filled after 5pm or on week-ends. 4. You should eat very light the first 24 hours after surgery, such as soup, crackers, pudding, etc.  Resume your normal diet the day after surgery 5. It is common to experience some constipation if taking pain medication after surgery.  Increasing fluid intake and taking a stool softener will usually help or prevent this problem from occurring.  A mild laxative (Milk of Magnesia or Miralax) should be taken according to package directions if there are no bowel movements after 48 hours. 6. You may shower in 48 hours.  The surgical glue will flake off in 2-3 weeks.   7. ACTIVITIES:  No strenuous activity or heavy lifting for 1 week.   a. You may drive when you no longer are taking prescription pain medication, you can comfortably wear a seatbelt, and you can safely maneuver your car and apply brakes. b. RETURN TO WORK:  __________3-3 weeks if applicable.  _______________ Dennis Bast should see your doctor in the office for a follow-up appointment approximately three-four weeks after your surgery.    WHEN TO CALL YOUR DOCTOR: 1. Fever over 101.0 2. Nausea and/or vomiting. 3. Extreme  swelling or bruising. 4. Continued bleeding from incision. 5. Increased pain, redness, or drainage from the incision.  The clinic staff is available to answer your questions during regular business hours.  Please dont hesitate to call and ask to speak to one of the nurses for clinical concerns.  If you have a medical emergency, go to the nearest emergency room or call 911.  A surgeon from [facility] is always on call at the hospital.  For further questions, please visit [email or web address]

## 2015-09-18 NOTE — Discharge Summary (Signed)
Physician Discharge Summary  Patient ID: Kim Chavez MRN: [id number] DOB/AGE: Jan 21, 1946 69 y.o.  Admit date: 09/16/2015 Discharge date: 09/18/2015  Admission Diagnoses: Patient Active Problem List   Diagnosis Date Noted  . Liver cyst 09/16/2015  . CAP (community acquired pneumonia) 05/01/2015  . Hypothyroidism 05/01/2015  . Essential hypertension 05/01/2015  . Crohn's disease ([place]) 05/01/2015  . Hyperlipidemia 05/01/2015  . Pneumonia 05/01/2015    Discharge Diagnoses:  Active Problems:   Liver cyst Same  Discharged Condition: good  Hospital Course:  Patient was admitted from the recovery room following laparoscopic liver cyst unroofing times 2 as well as cholecystectomy.  She had some pain and nausea overnight the first evening after surgery.  This resolved.  She was able to transition to oral pain medication.  She was able to void spontaneously and was able to ambulate independently.    Consults: None  Significant Diagnostic Studies: labs: glucose sl high.    Treatments: surgery: see above.    Discharge Exam: Blood pressure 118/57, pulse 67, temperature 98.6 F (37 C), temperature source Oral, resp. rate 19, height 5' 3.5" (1.613 m), weight 74.39 kg (164 lb), SpO2 99 %. General appearance: alert, cooperative and no distress Cardio: regular rate and rhythm GI: soft, non distended, approp tender.  Disposition: 01-Home or Self Care  Discharge Instructions    Call MD for:  difficulty breathing, headache or visual disturbances    Complete by:  As directed      Call MD for:  hives    Complete by:  As directed      Call MD for:  persistant nausea and vomiting    Complete by:  As directed      Call MD for:  redness, tenderness, or signs of infection (pain, swelling, redness, odor or green/yellow discharge around incision site)    Complete by:  As directed      Call MD for:  severe uncontrolled pain    Complete by:  As directed      Call MD for:  temperature  >100.4    Complete by:  As directed      Diet - low sodium heart healthy    Complete by:  As directed      Increase activity slowly    Complete by:  As directed             Medication List    TAKE these medications        albuterol 108 (90 BASE) MCG/ACT inhaler  Commonly known as:  PROVENTIL HFA;VENTOLIN HFA  Inhale 1-2 puffs into the lungs every 6 (six) hours as needed for wheezing or shortness of breath.     amitriptyline 75 MG tablet  Commonly known as:  ELAVIL  Take 75 mg by mouth at bedtime.     amLODipine 5 MG tablet  Commonly known as:  NORVASC  Take 5 mg by mouth every morning.     atenolol 50 MG tablet  Commonly known as:  TENORMIN  Take 50 mg by mouth at bedtime.     cetirizine 10 MG tablet  Commonly known as:  ZYRTEC  Take 10 mg by mouth every morning.     Cholecalciferol 5000 UNITS Tabs  Take 1 tablet by mouth every morning.     Co Q-10 100 MG Caps  Take 1 tablet by mouth every morning.     diazepam 5 MG tablet  Commonly known as:  VALIUM  Take 5 mg by mouth every 6 (six)  hours as needed for anxiety.     escitalopram 10 MG tablet  Commonly known as:  LEXAPRO  Take 10 mg by mouth every morning.     fluticasone 50 MCG/ACT nasal spray  Commonly known as:  FLONASE  Place 2 sprays into both nostrils 2 (two) times daily.     HYDROcodone-acetaminophen 5-325 MG tablet  Commonly known as:  NORCO/VICODIN  Take 1-2 tablets by mouth every 4 (four) hours as needed (migraine headache).     HYDROcodone-acetaminophen 5-325 MG tablet  Commonly known as:  NORCO/VICODIN  Take 1 tablet by mouth every 6 (six) hours as needed (migraine headache).     ipratropium 0.06 % nasal spray  Commonly known as:  ATROVENT  Place 2 sprays into both nostrils 4 (four) times daily. For nasal congestion     levothyroxine 50 MCG tablet  Commonly known as:  SYNTHROID, LEVOTHROID  Take 50 mcg by mouth daily before breakfast.     mesalamine 500 MG CR capsule  Commonly known as:   PENTASA  Take 1,000 mg by mouth 4 (four) times daily.     multivitamin with minerals Tabs tablet  Take 1 tablet by mouth every morning.     omeprazole 20 MG capsule  Commonly known as:  PRILOSEC  Take 20 mg by mouth 2 (two) times daily before a meal.     PROBIOTIC DAILY PO  Take 1 tablet by mouth every morning.     simvastatin 20 MG tablet  Commonly known as:  ZOCOR  Take 20 mg by mouth every morning.           Follow-up Information    Follow up with [facility], [place].   Specialty:  General Surgery   Contact information:   [address] [place] [address] [phone]       Signed: [name] 09/18/2015, 9:26 AM

## 2015-10-21 DIAGNOSIS — E039 Hypothyroidism, unspecified: Secondary | ICD-10-CM | POA: Diagnosis not present

## 2015-10-21 DIAGNOSIS — E785 Hyperlipidemia, unspecified: Secondary | ICD-10-CM | POA: Diagnosis not present

## 2015-10-21 DIAGNOSIS — R7301 Impaired fasting glucose: Secondary | ICD-10-CM | POA: Diagnosis not present

## 2015-10-21 DIAGNOSIS — Z1389 Encounter for screening for other disorder: Secondary | ICD-10-CM | POA: Diagnosis not present

## 2015-10-21 DIAGNOSIS — K5090 Crohn's disease, unspecified, without complications: Secondary | ICD-10-CM | POA: Diagnosis not present

## 2015-10-21 DIAGNOSIS — M25561 Pain in right knee: Secondary | ICD-10-CM | POA: Diagnosis not present

## 2015-10-21 DIAGNOSIS — Z6827 Body mass index (BMI) 27.0-27.9, adult: Secondary | ICD-10-CM | POA: Diagnosis not present

## 2015-10-21 DIAGNOSIS — M859 Disorder of bone density and structure, unspecified: Secondary | ICD-10-CM | POA: Diagnosis not present

## 2015-10-21 DIAGNOSIS — K7689 Other specified diseases of liver: Secondary | ICD-10-CM | POA: Diagnosis not present

## 2015-10-21 DIAGNOSIS — I10 Essential (primary) hypertension: Secondary | ICD-10-CM | POA: Diagnosis not present

## 2015-11-19 DIAGNOSIS — H25813 Combined forms of age-related cataract, bilateral: Secondary | ICD-10-CM | POA: Diagnosis not present

## 2015-11-19 DIAGNOSIS — H524 Presbyopia: Secondary | ICD-10-CM | POA: Diagnosis not present

## 2015-11-19 DIAGNOSIS — H5203 Hypermetropia, bilateral: Secondary | ICD-10-CM | POA: Diagnosis not present

## 2015-11-19 DIAGNOSIS — H40033 Anatomical narrow angle, bilateral: Secondary | ICD-10-CM | POA: Diagnosis not present

## 2016-01-28 DIAGNOSIS — Z86010 Personal history of colonic polyps: Secondary | ICD-10-CM | POA: Diagnosis not present

## 2016-01-28 DIAGNOSIS — K5090 Crohn's disease, unspecified, without complications: Secondary | ICD-10-CM | POA: Diagnosis not present

## 2016-02-27 DIAGNOSIS — Z86010 Personal history of colonic polyps: Secondary | ICD-10-CM | POA: Diagnosis not present

## 2016-02-27 DIAGNOSIS — K5090 Crohn's disease, unspecified, without complications: Secondary | ICD-10-CM | POA: Diagnosis not present

## 2016-03-03 DIAGNOSIS — Z9882 Breast implant status: Secondary | ICD-10-CM | POA: Diagnosis not present

## 2016-03-03 DIAGNOSIS — D0502 Lobular carcinoma in situ of left breast: Secondary | ICD-10-CM | POA: Diagnosis not present

## 2016-03-03 DIAGNOSIS — Z86000 Personal history of in-situ neoplasm of breast: Secondary | ICD-10-CM | POA: Diagnosis not present

## 2016-03-03 DIAGNOSIS — Z09 Encounter for follow-up examination after completed treatment for conditions other than malignant neoplasm: Secondary | ICD-10-CM | POA: Diagnosis not present

## 2016-03-03 DIAGNOSIS — Z8049 Family history of malignant neoplasm of other genital organs: Secondary | ICD-10-CM | POA: Diagnosis not present

## 2016-03-03 DIAGNOSIS — Z803 Family history of malignant neoplasm of breast: Secondary | ICD-10-CM | POA: Diagnosis not present

## 2016-03-03 DIAGNOSIS — Z9013 Acquired absence of bilateral breasts and nipples: Secondary | ICD-10-CM | POA: Diagnosis not present

## 2016-03-08 DIAGNOSIS — H1131 Conjunctival hemorrhage, right eye: Secondary | ICD-10-CM | POA: Diagnosis not present

## 2016-03-08 DIAGNOSIS — H5711 Ocular pain, right eye: Secondary | ICD-10-CM | POA: Diagnosis not present

## 2016-03-15 DIAGNOSIS — S8981XA Other specified injuries of right lower leg, initial encounter: Secondary | ICD-10-CM | POA: Diagnosis not present

## 2016-03-15 DIAGNOSIS — S838X1A Sprain of other specified parts of right knee, initial encounter: Secondary | ICD-10-CM | POA: Diagnosis not present

## 2016-03-15 DIAGNOSIS — M25561 Pain in right knee: Secondary | ICD-10-CM | POA: Diagnosis not present

## 2016-03-15 DIAGNOSIS — M25461 Effusion, right knee: Secondary | ICD-10-CM | POA: Diagnosis not present

## 2016-03-17 ENCOUNTER — Other Ambulatory Visit: Payer: Self-pay | Admitting: Gastroenterology

## 2016-03-17 DIAGNOSIS — Z86010 Personal history of colonic polyps: Secondary | ICD-10-CM | POA: Diagnosis not present

## 2016-03-17 DIAGNOSIS — D126 Benign neoplasm of colon, unspecified: Secondary | ICD-10-CM | POA: Diagnosis not present

## 2016-03-17 DIAGNOSIS — K648 Other hemorrhoids: Secondary | ICD-10-CM | POA: Diagnosis not present

## 2016-03-17 DIAGNOSIS — K5730 Diverticulosis of large intestine without perforation or abscess without bleeding: Secondary | ICD-10-CM | POA: Diagnosis not present

## 2016-03-17 DIAGNOSIS — D123 Benign neoplasm of transverse colon: Secondary | ICD-10-CM | POA: Diagnosis not present

## 2016-03-17 DIAGNOSIS — K644 Residual hemorrhoidal skin tags: Secondary | ICD-10-CM | POA: Diagnosis not present

## 2016-03-17 DIAGNOSIS — D124 Benign neoplasm of descending colon: Secondary | ICD-10-CM | POA: Diagnosis not present

## 2016-03-24 DIAGNOSIS — S8981XD Other specified injuries of right lower leg, subsequent encounter: Secondary | ICD-10-CM | POA: Diagnosis not present

## 2016-03-24 DIAGNOSIS — M2241 Chondromalacia patellae, right knee: Secondary | ICD-10-CM | POA: Diagnosis not present

## 2016-03-24 DIAGNOSIS — S838X1D Sprain of other specified parts of right knee, subsequent encounter: Secondary | ICD-10-CM | POA: Diagnosis not present

## 2016-03-24 DIAGNOSIS — M25461 Effusion, right knee: Secondary | ICD-10-CM | POA: Diagnosis not present

## 2016-03-31 ENCOUNTER — Emergency Department (HOSPITAL_COMMUNITY)
Admission: EM | Admit: 2016-03-31 | Discharge: 2016-03-31 | Disposition: A | Discharge status: Home / Self Care | Payer: Medicare Other | Attending: Emergency Medicine | Admitting: Emergency Medicine

## 2016-03-31 ENCOUNTER — Encounter (HOSPITAL_COMMUNITY): Payer: Self-pay | Admitting: Emergency Medicine

## 2016-03-31 DIAGNOSIS — F419 Anxiety disorder, unspecified: Secondary | ICD-10-CM | POA: Insufficient documentation

## 2016-03-31 DIAGNOSIS — G4730 Sleep apnea, unspecified: Secondary | ICD-10-CM | POA: Diagnosis not present

## 2016-03-31 DIAGNOSIS — Z79899 Other long term (current) drug therapy: Secondary | ICD-10-CM | POA: Insufficient documentation

## 2016-03-31 DIAGNOSIS — R197 Diarrhea, unspecified: Secondary | ICD-10-CM | POA: Insufficient documentation

## 2016-03-31 DIAGNOSIS — R231 Pallor: Secondary | ICD-10-CM | POA: Diagnosis not present

## 2016-03-31 DIAGNOSIS — K219 Gastro-esophageal reflux disease without esophagitis: Secondary | ICD-10-CM | POA: Diagnosis not present

## 2016-03-31 DIAGNOSIS — R112 Nausea with vomiting, unspecified: Secondary | ICD-10-CM | POA: Insufficient documentation

## 2016-03-31 DIAGNOSIS — E079 Disorder of thyroid, unspecified: Secondary | ICD-10-CM | POA: Diagnosis not present

## 2016-03-31 DIAGNOSIS — R51 Headache: Secondary | ICD-10-CM | POA: Diagnosis not present

## 2016-03-31 DIAGNOSIS — Z87891 Personal history of nicotine dependence: Secondary | ICD-10-CM | POA: Diagnosis not present

## 2016-03-31 DIAGNOSIS — M797 Fibromyalgia: Secondary | ICD-10-CM | POA: Diagnosis not present

## 2016-03-31 DIAGNOSIS — Z7951 Long term (current) use of inhaled steroids: Secondary | ICD-10-CM | POA: Insufficient documentation

## 2016-03-31 DIAGNOSIS — Z880 Allergy status to penicillin: Secondary | ICD-10-CM | POA: Diagnosis not present

## 2016-03-31 DIAGNOSIS — I10 Essential (primary) hypertension: Secondary | ICD-10-CM | POA: Diagnosis not present

## 2016-03-31 DIAGNOSIS — Z853 Personal history of malignant neoplasm of breast: Secondary | ICD-10-CM | POA: Diagnosis not present

## 2016-03-31 DIAGNOSIS — Z8701 Personal history of pneumonia (recurrent): Secondary | ICD-10-CM | POA: Insufficient documentation

## 2016-03-31 DIAGNOSIS — Z9981 Dependence on supplemental oxygen: Secondary | ICD-10-CM | POA: Diagnosis not present

## 2016-03-31 DIAGNOSIS — R5383 Other fatigue: Secondary | ICD-10-CM | POA: Diagnosis not present

## 2016-03-31 DIAGNOSIS — E039 Hypothyroidism, unspecified: Secondary | ICD-10-CM | POA: Insufficient documentation

## 2016-03-31 DIAGNOSIS — R531 Weakness: Secondary | ICD-10-CM | POA: Insufficient documentation

## 2016-03-31 DIAGNOSIS — Z87448 Personal history of other diseases of urinary system: Secondary | ICD-10-CM | POA: Insufficient documentation

## 2016-03-31 LAB — COMPREHENSIVE METABOLIC PANEL
ALT: 107 U/L — AB (ref 14–54)
ANION GAP: 15 (ref 5–15)
AST: 69 U/L — ABNORMAL HIGH (ref 15–41)
Albumin: 3.7 g/dL (ref 3.5–5.0)
Alkaline Phosphatase: 110 U/L (ref 38–126)
BUN: 21 mg/dL — ABNORMAL HIGH (ref 6–20)
CHLORIDE: 100 mmol/L — AB (ref 101–111)
CO2: 24 mmol/L (ref 22–32)
CREATININE: 1.1 mg/dL — AB (ref 0.44–1.00)
Calcium: 8.8 mg/dL — ABNORMAL LOW (ref 8.9–10.3)
GFR, EST AFRICAN AMERICAN: 58 mL/min — AB (ref >60)
GFR, EST NON AFRICAN AMERICAN: 50 mL/min — AB (ref >60)
Glucose, Bld: 126 mg/dL — ABNORMAL HIGH (ref 65–99)
POTASSIUM: 4 mmol/L (ref 3.5–5.1)
SODIUM: 139 mmol/L (ref 135–145)
Total Bilirubin: 2.3 mg/dL — ABNORMAL HIGH (ref 0.3–1.2)
Total Protein: 7.2 g/dL (ref 6.5–8.1)

## 2016-03-31 LAB — CBC
HCT: 46.8 % — ABNORMAL HIGH (ref 36.0–46.0)
HEMOGLOBIN: 15.5 g/dL — AB (ref 12.0–15.0)
MCH: 27.2 pg (ref 26.0–34.0)
MCHC: 33.1 g/dL (ref 30.0–36.0)
MCV: 82.2 fL (ref 78.0–100.0)
PLATELETS: 266 10*3/uL (ref 150–400)
RBC: 5.69 MIL/uL — AB (ref 3.87–5.11)
RDW: 14.6 % (ref 11.5–15.5)
WBC: 8.5 10*3/uL (ref 4.0–10.5)

## 2016-03-31 LAB — I-STAT CG4 LACTIC ACID, ED: Lactic Acid, Venous: 1.87 mmol/L (ref 0.5–2.0)

## 2016-03-31 LAB — LIPASE, BLOOD: LIPASE: 17 U/L (ref 11–51)

## 2016-03-31 MED ORDER — ONDANSETRON HCL 4 MG PO TABS: 4.0000 mg | ORAL_TABLET | Freq: Four times a day (QID) | ORAL | Status: DC

## 2016-03-31 MED ORDER — SODIUM CHLORIDE 0.9 % IV BOLUS (SEPSIS)
1000.0000 mL | Freq: Once | INTRAVENOUS | Status: AC
  Administered 2016-03-31: 1000 mL via INTRAVENOUS

## 2016-03-31 MED ORDER — ONDANSETRON HCL 4 MG/2ML IJ SOLN
4.0000 mg | Freq: Once | INTRAMUSCULAR | Status: AC
  Administered 2016-03-31: 4 mg via INTRAVENOUS
  Filled 2016-03-31: qty 2

## 2016-03-31 NOTE — ED Notes (Signed)
Pt to sitting position and BP 95 HR 97. PA aware and at bedside.

## 2016-03-31 NOTE — ED Provider Notes (Signed)
CSN: [id number]     Arrival date & time 03/31/16  1779 History   First MD Initiated Contact with Patient 03/31/16 [phone]     Chief Complaint  Patient presents with  . Emesis  . Diarrhea   HPI Comments: 70 year old female who presents with N/V/D. She started feeling nauseous 2 days ago and has had multiple episodes of vomiting. Diarrhea started this morning. Reports associated weakness, decreased oral intake, and decreased urine output. Denies fever, chills, dizziness, chest pain, shortness of breath, abdominal pain, blood in vomit, blood in stool or urine, dysuria. Reports sick contacts. Denies recent antibiotic use or travel. PMH significant for laparoscopic liver cyst unroofing and cholecystectomy in Oct 2016 and Crohn's disease.  Patient is a 70 y.o. female presenting with vomiting and diarrhea.  Emesis Associated symptoms: diarrhea and headaches   Associated symptoms: no abdominal pain and no chills   Diarrhea Associated symptoms: headaches and vomiting   Associated symptoms: no abdominal pain, no chills and no fever     Past Medical History  Diagnosis Date  . RUQ pain   . Hypertension   . Thyroid disease   . Overactive bladder   . Migraine   . Fibromyalgia   . Breast cancer ([place])   . Sleep apnea     uses c-pap machine  . Pneumonia     several times  . Hypothyroidism   . Anxiety   . Depression   . GERD (gastroesophageal reflux disease)    Past Surgical History  Procedure Laterality Date  . Appendectomy    . Simple mastectomy w/ sentinel node biopsy Bilateral 2011  . Tubal ligation    . Deviated septum surgery in 1980's    . Laparoscopic liver cyst unroofing N/A 09/16/2015    Procedure: LAPAROSCOPIC LIVER CYST UNROOFING X'S 2;  Surgeon: [name], MD;  Location: [facility];  Service: General;  Laterality: N/A;  . Cholecystectomy N/A 09/16/2015    Procedure: CHOLECYSTECTOMY;  Surgeon: [name], MD;  Location: [facility];  Service: General;  Laterality: N/A;   Family  History  Problem Relation Age of Onset  . Breast cancer Mother   . CAD Father    Social History  Substance Use Topics  . Smoking status: Former Smoker    Quit date: 09/12/1975  . Smokeless tobacco: Never Used  . Alcohol Use: No   OB History    No data available     Review of Systems  Constitutional: Positive for fatigue. Negative for fever and chills.  Respiratory: Negative for shortness of breath.   Cardiovascular: Negative for chest pain.  Gastrointestinal: Positive for nausea, vomiting and diarrhea. Negative for abdominal pain.  Neurological: Positive for headaches. Negative for syncope and light-headedness.    Allergies  Erythromycin; Tape; Codeine; Ibuprofen; Neosporin; and Penicillins  Home Medications   Prior to Admission medications   Medication Sig Start Date End Date Taking? Authorizing Provider  albuterol (PROVENTIL HFA;VENTOLIN HFA) 108 (90 BASE) MCG/ACT inhaler Inhale 1-2 puffs into the lungs every 6 (six) hours as needed for wheezing or shortness of breath. Patient not taking: Reported on 09/12/2015 04/19/15   Robyn Haber, MD  amitriptyline (ELAVIL) 75 MG tablet Take 75 mg by mouth at bedtime.    Historical Provider, MD  amLODipine (NORVASC) 5 MG tablet Take 5 mg by mouth every morning.     Historical Provider, MD  atenolol (TENORMIN) 50 MG tablet Take 50 mg by mouth at bedtime.    Historical Provider, MD  cetirizine (ZYRTEC) 10  MG tablet Take 10 mg by mouth every morning.     Historical Provider, MD  Cholecalciferol 5000 UNITS TABS Take 1 tablet by mouth every morning.     Historical Provider, MD  Coenzyme Q10 (CO Q-10) 100 MG CAPS Take 1 tablet by mouth every morning.     Historical Provider, MD  diazepam (VALIUM) 5 MG tablet Take 5 mg by mouth every 6 (six) hours as needed for anxiety.    Historical Provider, MD  escitalopram (LEXAPRO) 10 MG tablet Take 10 mg by mouth every morning.     Historical Provider, MD  fluticasone (FLONASE) 50 MCG/ACT nasal spray  Place 2 sprays into both nostrils 2 (two) times daily.    Historical Provider, MD  HYDROcodone-acetaminophen (NORCO/VICODIN) 5-325 MG tablet Take 1-2 tablets by mouth every 4 (four) hours as needed (migraine headache). 09/18/15   [name], MD  HYDROcodone-acetaminophen (NORCO/VICODIN) 5-325 MG tablet Take 1 tablet by mouth every 6 (six) hours as needed (migraine headache). 09/18/15   [name], MD  ipratropium (ATROVENT) 0.06 % nasal spray Place 2 sprays into both nostrils 4 (four) times daily. For nasal congestion Patient not taking: Reported on 05/01/2015 03/09/14   Lutricia Feil, PA  levothyroxine (SYNTHROID, LEVOTHROID) 50 MCG tablet Take 50 mcg by mouth daily before breakfast.    Historical Provider, MD  mesalamine (PENTASA) 500 MG CR capsule Take 1,000 mg by mouth 4 (four) times daily.    Historical Provider, MD  Multiple Vitamin (MULTIVITAMIN WITH MINERALS) TABS tablet Take 1 tablet by mouth every morning.     Historical Provider, MD  omeprazole (PRILOSEC) 20 MG capsule Take 20 mg by mouth 2 (two) times daily before a meal.    Historical Provider, MD  Probiotic Product (PROBIOTIC DAILY PO) Take 1 tablet by mouth every morning.     Historical Provider, MD  simvastatin (ZOCOR) 20 MG tablet Take 20 mg by mouth every morning.     Historical Provider, MD   BP 98/64 mmHg  Pulse 88  Temp(Src) 97.3 F (36.3 C) (Oral)  Resp 18  SpO2 98%   Physical Exam  Constitutional: She is oriented to person, place, and time. She appears well-developed and well-nourished. No distress.  HENT:  Head: Normocephalic and atraumatic.  Eyes: Conjunctivae are normal. Pupils are equal, round, and reactive to light. Right eye exhibits no discharge. Left eye exhibits no discharge. No scleral icterus.  Neck: Normal range of motion.  Cardiovascular: Normal rate and regular rhythm.  Exam reveals no gallop and no friction rub.   No murmur heard. Pulmonary/Chest: Breath sounds normal. No respiratory distress.  She has no wheezes. She has no rales. She exhibits no tenderness.  Abdominal: Soft. Bowel sounds are normal. She exhibits no distension and no mass. There is no tenderness. There is no rebound and no guarding.  Neurological: She is alert and oriented to person, place, and time.  Skin: Skin is warm and dry. There is pallor.  Psychiatric: She has a normal mood and affect.    ED Course  Procedures (including critical care time) Labs Review Labs Reviewed  COMPREHENSIVE METABOLIC PANEL - Abnormal; Notable for the following:    Chloride 100 (*)    Glucose, Bld 126 (*)    BUN 21 (*)    Creatinine, Ser 1.10 (*)    Calcium 8.8 (*)    AST 69 (*)    ALT 107 (*)    Total Bilirubin 2.3 (*)    GFR calc non Af  Amer 50 (*)    GFR calc Af Amer 58 (*)    All other components within normal limits  CBC - Abnormal; Notable for the following:    RBC 5.69 (*)    Hemoglobin 15.5 (*)    HCT 46.8 (*)    All other components within normal limits  LIPASE, BLOOD  URINALYSIS, ROUTINE W REFLEX MICROSCOPIC (NOT AT [facility])  I-STAT CG4 LACTIC ACID, ED    Imaging Review No results found. I have personally reviewed and evaluated these images and lab results as part of my medical decision-making.   EKG Interpretation None      MDM   Final diagnoses:  Nausea vomiting and diarrhea   70 year old female who presents with N/V/D. 2L of IVF given here in ED. Zofran given for nausea which patient states has helped. Most likely her symptoms are viral. Pt is feeling weak upon standing, however after 2L she is able to stand without difficulty. Instructed her to hold her BP meds today, monitor her BP at home, and call her PCP for instructions tomorrow. Patient and husband verbalized understanding. PO challenge was tolerated well. Patient is NAD, non-toxic with stable VS. Patient informed of clinical course, understand medical decision-making process, and agree with plan.     Recardo Evangelist, PA-C 03/31/16  1614  Ripley Fraise, MD 04/02/16 [phone]

## 2016-03-31 NOTE — ED Notes (Signed)
Said she couldn't urinate yet. Give her a little bit of time

## 2016-03-31 NOTE — ED Notes (Signed)
Pt statesshe understands instructions.

## 2016-03-31 NOTE — ED Notes (Signed)
Pt taking po fluids and tolerating well.

## 2016-03-31 NOTE — ED Notes (Signed)
Pt sts N/V/D since Monday; pt sts generalized weakness

## 2016-03-31 NOTE — ED Provider Notes (Signed)
Patient seen/examined in the Emergency Department in conjunction with Midlevel Provider [facility] Patient reports vomiting/diarrhea, denies bloody stool, reports recent sick contacts Exam : awake/alert, no distress, abd soft and nontender Plan: probable viral GI illness Will rehydrate and reassess    Kim Fraise, MD 03/31/16 1024

## 2016-04-05 DIAGNOSIS — R945 Abnormal results of liver function studies: Secondary | ICD-10-CM | POA: Diagnosis not present

## 2016-04-05 DIAGNOSIS — Z6828 Body mass index (BMI) 28.0-28.9, adult: Secondary | ICD-10-CM | POA: Diagnosis not present

## 2016-04-05 DIAGNOSIS — N390 Urinary tract infection, site not specified: Secondary | ICD-10-CM | POA: Diagnosis not present

## 2016-04-05 DIAGNOSIS — A088 Other specified intestinal infections: Secondary | ICD-10-CM | POA: Diagnosis not present

## 2016-04-05 DIAGNOSIS — I10 Essential (primary) hypertension: Secondary | ICD-10-CM | POA: Diagnosis not present

## 2016-04-05 DIAGNOSIS — R8299 Other abnormal findings in urine: Secondary | ICD-10-CM | POA: Diagnosis not present

## 2016-06-02 DIAGNOSIS — S83281A Other tear of lateral meniscus, current injury, right knee, initial encounter: Secondary | ICD-10-CM | POA: Diagnosis not present

## 2016-06-10 DIAGNOSIS — M25561 Pain in right knee: Secondary | ICD-10-CM | POA: Diagnosis not present

## 2016-06-16 DIAGNOSIS — M25561 Pain in right knee: Secondary | ICD-10-CM | POA: Diagnosis not present

## 2016-07-29 DIAGNOSIS — M1711 Unilateral primary osteoarthritis, right knee: Secondary | ICD-10-CM | POA: Diagnosis not present

## 2016-08-09 DIAGNOSIS — M859 Disorder of bone density and structure, unspecified: Secondary | ICD-10-CM | POA: Diagnosis not present

## 2016-08-09 DIAGNOSIS — E784 Other hyperlipidemia: Secondary | ICD-10-CM | POA: Diagnosis not present

## 2016-08-09 DIAGNOSIS — I10 Essential (primary) hypertension: Secondary | ICD-10-CM | POA: Diagnosis not present

## 2016-08-09 DIAGNOSIS — R7301 Impaired fasting glucose: Secondary | ICD-10-CM | POA: Diagnosis not present

## 2016-08-09 DIAGNOSIS — E038 Other specified hypothyroidism: Secondary | ICD-10-CM | POA: Diagnosis not present

## 2016-08-12 DIAGNOSIS — Z1389 Encounter for screening for other disorder: Secondary | ICD-10-CM | POA: Diagnosis not present

## 2016-08-12 DIAGNOSIS — E784 Other hyperlipidemia: Secondary | ICD-10-CM | POA: Diagnosis not present

## 2016-08-12 DIAGNOSIS — F329 Major depressive disorder, single episode, unspecified: Secondary | ICD-10-CM | POA: Diagnosis not present

## 2016-08-12 DIAGNOSIS — Z23 Encounter for immunization: Secondary | ICD-10-CM | POA: Diagnosis not present

## 2016-08-12 DIAGNOSIS — K7689 Other specified diseases of liver: Secondary | ICD-10-CM | POA: Diagnosis not present

## 2016-08-12 DIAGNOSIS — K50919 Crohn's disease, unspecified, with unspecified complications: Secondary | ICD-10-CM | POA: Diagnosis not present

## 2016-08-12 DIAGNOSIS — I10 Essential (primary) hypertension: Secondary | ICD-10-CM | POA: Diagnosis not present

## 2016-08-12 DIAGNOSIS — R945 Abnormal results of liver function studies: Secondary | ICD-10-CM | POA: Diagnosis not present

## 2016-08-12 DIAGNOSIS — M859 Disorder of bone density and structure, unspecified: Secondary | ICD-10-CM | POA: Diagnosis not present

## 2016-08-12 DIAGNOSIS — E038 Other specified hypothyroidism: Secondary | ICD-10-CM | POA: Diagnosis not present

## 2016-08-12 DIAGNOSIS — Z0000 Encounter for general adult medical examination without abnormal findings: Secondary | ICD-10-CM | POA: Diagnosis not present

## 2016-08-12 DIAGNOSIS — R7301 Impaired fasting glucose: Secondary | ICD-10-CM | POA: Diagnosis not present

## 2016-08-24 DIAGNOSIS — Z85828 Personal history of other malignant neoplasm of skin: Secondary | ICD-10-CM | POA: Diagnosis not present

## 2016-08-24 DIAGNOSIS — L821 Other seborrheic keratosis: Secondary | ICD-10-CM | POA: Diagnosis not present

## 2016-08-24 DIAGNOSIS — D225 Melanocytic nevi of trunk: Secondary | ICD-10-CM | POA: Diagnosis not present

## 2016-08-24 DIAGNOSIS — C4441 Basal cell carcinoma of skin of scalp and neck: Secondary | ICD-10-CM | POA: Diagnosis not present

## 2016-08-24 DIAGNOSIS — D485 Neoplasm of uncertain behavior of skin: Secondary | ICD-10-CM | POA: Diagnosis not present

## 2016-08-24 DIAGNOSIS — D1801 Hemangioma of skin and subcutaneous tissue: Secondary | ICD-10-CM | POA: Diagnosis not present

## 2016-08-24 DIAGNOSIS — D2272 Melanocytic nevi of left lower limb, including hip: Secondary | ICD-10-CM | POA: Diagnosis not present

## 2016-08-24 DIAGNOSIS — L570 Actinic keratosis: Secondary | ICD-10-CM | POA: Diagnosis not present

## 2016-09-08 DIAGNOSIS — Z85828 Personal history of other malignant neoplasm of skin: Secondary | ICD-10-CM | POA: Diagnosis not present

## 2016-09-08 DIAGNOSIS — C4441 Basal cell carcinoma of skin of scalp and neck: Secondary | ICD-10-CM | POA: Diagnosis not present

## 2016-10-19 DIAGNOSIS — Z6828 Body mass index (BMI) 28.0-28.9, adult: Secondary | ICD-10-CM | POA: Diagnosis not present

## 2016-10-19 DIAGNOSIS — Z1389 Encounter for screening for other disorder: Secondary | ICD-10-CM | POA: Diagnosis not present

## 2016-10-19 DIAGNOSIS — Z01419 Encounter for gynecological examination (general) (routine) without abnormal findings: Secondary | ICD-10-CM | POA: Diagnosis not present

## 2016-10-28 DIAGNOSIS — K5090 Crohn's disease, unspecified, without complications: Secondary | ICD-10-CM | POA: Diagnosis not present

## 2016-10-28 DIAGNOSIS — N8110 Cystocele, unspecified: Secondary | ICD-10-CM | POA: Diagnosis not present

## 2016-10-28 DIAGNOSIS — Z86010 Personal history of colonic polyps: Secondary | ICD-10-CM | POA: Diagnosis not present

## 2016-11-18 DIAGNOSIS — H43813 Vitreous degeneration, bilateral: Secondary | ICD-10-CM | POA: Diagnosis not present

## 2016-11-18 DIAGNOSIS — H40033 Anatomical narrow angle, bilateral: Secondary | ICD-10-CM | POA: Diagnosis not present

## 2016-11-18 DIAGNOSIS — H5203 Hypermetropia, bilateral: Secondary | ICD-10-CM | POA: Diagnosis not present

## 2016-11-18 DIAGNOSIS — H5315 Visual distortions of shape and size: Secondary | ICD-10-CM | POA: Diagnosis not present

## 2016-12-22 NOTE — Patient Instructions (Signed)
Your procedure is scheduled on:  Wednesday, Jan. 24, 2018  Enter through the [employer] [facility] at:  6:00 AM  Pick up the phone at the desk and dial [phone].  Call this number if you have problems the morning of surgery: [phone].  Remember: Do NOT eat food or drink after:  Midnight Tuesday, Jan. 23, 2018  Take these medicines the morning of surgery with a SIP OF WATER:  Escitalopram, Levothyroxine, Mesalamine, Omeprazole, Diazepam if needed  Bring Asthma Inhaler day of surgery.  Bring CPAP machine day of surgery.  Stop ALL herbal medications at this time.  Do NOT smoke the day of surgery.  Do NOT wear jewelry (body piercing), metal hair clips/bobby pins, make-up, or nail polish. Do NOT wear lotions, powders, or perfumes.  You may wear deodorant. Do NOT shave for 48 hours prior to surgery. Do NOT bring valuables to the hospital. Contacts, dentures, or bridgework may not be worn into surgery. Leave suitcase in car.  After surgery it may be brought to your room.  For patients admitted to the hospital, checkout time is 11:00 AM the day of discharge. Bring a copy of your healthcare power of attorney and living will documents.

## 2016-12-23 ENCOUNTER — Inpatient Hospital Stay (HOSPITAL_COMMUNITY)
Admission: RE | Admit: 2016-12-23 | Discharge: 2016-12-23 | Disposition: A | Discharge status: Home / Self Care | Payer: Medicare Other | Source: Ambulatory Visit

## 2016-12-27 ENCOUNTER — Encounter (HOSPITAL_COMMUNITY): Payer: Self-pay

## 2016-12-27 ENCOUNTER — Encounter (HOSPITAL_COMMUNITY)
Admission: RE | Admit: 2016-12-27 | Discharge: 2016-12-27 | Disposition: A | Discharge status: Home / Self Care | Payer: Medicare Other | Source: Ambulatory Visit | Attending: Obstetrics and Gynecology | Admitting: Obstetrics and Gynecology

## 2016-12-27 ENCOUNTER — Other Ambulatory Visit: Payer: Self-pay

## 2016-12-27 DIAGNOSIS — Z01812 Encounter for preprocedural laboratory examination: Secondary | ICD-10-CM | POA: Diagnosis not present

## 2016-12-27 LAB — COMPREHENSIVE METABOLIC PANEL
ALT: 89 U/L — ABNORMAL HIGH (ref 14–54)
AST: 85 U/L — ABNORMAL HIGH (ref 15–41)
Albumin: 4.2 g/dL (ref 3.5–5.0)
Alkaline Phosphatase: 119 U/L (ref 38–126)
Anion gap: 9 (ref 5–15)
BUN: 10 mg/dL (ref 6–20)
CHLORIDE: 104 mmol/L (ref 101–111)
CO2: 27 mmol/L (ref 22–32)
Calcium: 9.1 mg/dL (ref 8.9–10.3)
Creatinine, Ser: 0.89 mg/dL (ref 0.44–1.00)
Glucose, Bld: 97 mg/dL (ref 65–99)
POTASSIUM: 3.2 mmol/L — AB (ref 3.5–5.1)
Sodium: 140 mmol/L (ref 135–145)
Total Bilirubin: 0.5 mg/dL (ref 0.3–1.2)
Total Protein: 7.3 g/dL (ref 6.5–8.1)

## 2016-12-27 LAB — CBC
HCT: 44.9 % (ref 36.0–46.0)
Hemoglobin: 15.4 g/dL — ABNORMAL HIGH (ref 12.0–15.0)
MCH: 27.2 pg (ref 26.0–34.0)
MCHC: 34.3 g/dL (ref 30.0–36.0)
MCV: 79.3 fL (ref 78.0–100.0)
PLATELETS: 262 10*3/uL (ref 150–400)
RBC: 5.66 MIL/uL — ABNORMAL HIGH (ref 3.87–5.11)
RDW: 13.5 % (ref 11.5–15.5)
WBC: 7.7 10*3/uL (ref 4.0–10.5)

## 2016-12-27 LAB — TYPE AND SCREEN
ABO/RH(D): O NEG
ANTIBODY SCREEN: NEGATIVE

## 2016-12-27 LAB — ABO/RH: ABO/RH(D): O NEG

## 2016-12-27 NOTE — Patient Instructions (Signed)
Your procedure is scheduled on: Wednesday January 05, 2017 at 7:30 am  Enter through the [employer] [facility] at: 6 am  Pick up the phone at the desk and dial [phone].  Call this number if you have problems the morning of surgery: [phone].  Remember: Do NOT eat food: after Midnight on Tuesday January 23 Do NOT drink clear liquids after: Take these medicines the morning of surgery with a SIP OF WATER: Lexapro, synthroid, Prilosec   STOP TAKING VITAMINS AND SUPPLEMENTS TODAY EXCEPT Co Q 10  Do NOT wear jewelry (body piercing), metal hair clips/bobby pins, make-up, or nail polish. Do NOT wear lotions, powders, or perfumes.  You may wear deoderant. Do NOT shave for 48 hours prior to surgery. Do NOT bring valuables to the hospital. Contacts, dentures, or bridgework may not be worn into surgery. Leave suitcase in car.  After surgery it may be brought to your room.  For patients admitted to the hospital, checkout time is 11:00 AM the day of discharge.

## 2016-12-27 NOTE — Pre-Procedure Instructions (Signed)
No blood pressure on left side

## 2016-12-31 DIAGNOSIS — R110 Nausea: Secondary | ICD-10-CM | POA: Diagnosis not present

## 2016-12-31 DIAGNOSIS — A084 Viral intestinal infection, unspecified: Secondary | ICD-10-CM | POA: Diagnosis not present

## 2016-12-31 DIAGNOSIS — Z6827 Body mass index (BMI) 27.0-27.9, adult: Secondary | ICD-10-CM | POA: Diagnosis not present

## 2016-12-31 DIAGNOSIS — R509 Fever, unspecified: Secondary | ICD-10-CM | POA: Diagnosis not present

## 2016-12-31 DIAGNOSIS — I10 Essential (primary) hypertension: Secondary | ICD-10-CM | POA: Diagnosis not present

## 2017-01-04 MED ORDER — CIPROFLOXACIN IN D5W 400 MG/200ML IV SOLN
400.0000 mg | INTRAVENOUS | Status: AC
  Administered 2017-01-05: 400 mg via INTRAVENOUS
  Filled 2017-01-04: qty 200

## 2017-01-04 MED ORDER — CLINDAMYCIN PHOSPHATE 900 MG/50ML IV SOLN
900.0000 mg | INTRAVENOUS | Status: AC
  Administered 2017-01-05: 900 mg via INTRAVENOUS
  Filled 2017-01-04: qty 50

## 2017-01-04 NOTE — H&P (Signed)
Kim Chavez is an 71 y.o. female G28P7 who presents for LAVH/BSO and A/P repair for control of pelvic prolapse symptoms and would like definitive removal of adnexa given a personal history of breast cancer.   The patient had a double mastectomy for widespread DCIS in 2011. She has had reconstruction and implants placed 2015. Her oncologist has released 1/17. Her pap is UTD and WNL.  Colonoscopy UTD 4/17, due in 5 years. She has a history of Crohns disease. Her husband has cardiac and kidney disease--they are sexually active when he uses pills for erectile dysfunction. She has noted an increase in bladder pressure and slowness starting her stream. Occasional SUI, but not large volume. Feels her bladder is bulging down.  Pertinent Gynecological History: OB History:  NSVD x 7  Menstrual History: No LMP recorded. Patient is postmenopausal.    Past Medical History:  Diagnosis Date  . Anxiety   . Breast cancer ([place]) 2011   BILATERAL BREAST REMOVED  . Depression   . Fibromyalgia   . GERD (gastroesophageal reflux disease)   . Hypertension   . Hypothyroidism   . Migraine    MIGRAINES  . Overactive bladder   . Pneumonia    several times  . RUQ pain   . Sleep apnea    uses c-pap machine  . Thyroid disease     Past Surgical History:  Procedure Laterality Date  . APPENDECTOMY    . CHOLECYSTECTOMY N/A 09/16/2015   Procedure: CHOLECYSTECTOMY;  Surgeon: [name], MD;  Location: [facility];  Service: General;  Laterality: N/A;  . deviated septum surgery in 1980's    . LAPAROSCOPIC LIVER CYST UNROOFING N/A 09/16/2015   Procedure: LAPAROSCOPIC LIVER CYST UNROOFING X'S 2;  Surgeon: [name], MD;  Location: [facility];  Service: General;  Laterality: N/A;  . SIMPLE MASTECTOMY W/ SENTINEL NODE BIOPSY Bilateral 2011  . TUBAL LIGATION      Family History  Problem Relation Age of Onset  . Breast cancer Mother   . CAD Father     Social History:  reports that she quit smoking about 41 years  ago. She has never used smokeless tobacco. She reports that she does not drink alcohol or use drugs.  Allergies:  Allergies  Allergen Reactions  . Erythromycin Nausea And Vomiting  . Tape Rash  . Codeine Nausea And Vomiting    "deathly sick on stomach"   . Ibuprofen Other (See Comments)    Has Crohn's and was instructed not to take.   . Neosporin [Neomycin-Bacitracin Zn-Polymyx] Itching and Other (See Comments)    Turns red around the site and itches like crazy for about two weeks.   Marland Kitchen Penicillins Swelling    Has patient had a PCN reaction causing immediate rash, facial/tongue/throat swelling, SOB or lightheadedness with hypotension: Yes- "swell up all over"  Has patient had a PCN reaction causing severe rash involving mucus membranes or skin necrosis: No Has patient had a PCN reaction that required hospitalization No Has patient had a PCN reaction occurring within the last 10 years: No If all of the above answers are "NO", then may proceed with Cephalosporin use.     No prescriptions prior to admission.    Review of Systems  Gastrointestinal: Negative for abdominal pain.  Genitourinary: Positive for urgency.    There were no vitals taken for this visit. Physical Exam  Constitutional: She appears well-developed and well-nourished.  Cardiovascular: Normal rate.   Respiratory: Effort normal.  GI: Soft.  Genitourinary:  Vagina normal.  Neurological: She is alert.  Psychiatric: She has a normal mood and affect.   Vulva: normal and no excoriation. Mons: no erythema or excoriation and normal. Labia Majora: normal and no erythema. Labia Minora: normal and no erythema. Introitus: normal. Vagina: no discharge or blood present, atrophy moderate and cystocele stage 2, and sample taken for a Pap smear. Cervix: no lesions or discharge and grossly normal. Uterus: midline, non-tender, normal contour, and uterine prolapse stage 2. Adnexa/Parametria: no mass palpable.   No results found for  this or any previous visit (from the past 24 hour(s)).  No results found.  Assessment/Plan: I have d/w pt in detail her pelvic prolapse with cystocele and uterine prolapse, rectocele smaller. d/w pt options of surgery,pessary and expectant management. We also discussed that vaginal hysterectomy A/P repair may lead to worsening SUI. She states had urologic evaluation in past with Dr. Karsten Ro and was told had both SUI and some bladder dysfunction treated medically. She could not afford meds, so went off. She understands the risk of worsening bladder sx but would rather do surgery for prolapse and not reevaluate bladder unless sx worsen post-surgically. In light of her breast cancer we also discussed removal of tubes and ovaries. She would definitely like her ovaries and tubes removed so will plan laparoscopic assisted approach. We reviewed risks and benefits of surgery including bleeding, infection and possible damage to bowel and bladder.  She understands any unexpected injury could lead to a larger abdominal incision and prolong recovery.She is ready to proceed.   Logan Bores 01/04/2017, 8:36 PM

## 2017-01-05 ENCOUNTER — Ambulatory Visit (HOSPITAL_COMMUNITY): Payer: Medicare Other | Admitting: Anesthesiology

## 2017-01-05 ENCOUNTER — Ambulatory Visit (HOSPITAL_COMMUNITY)
Admission: RE | Admit: 2017-01-05 | Discharge: 2017-01-06 | Disposition: A | Discharge status: Home / Self Care | Payer: Medicare Other | Source: Ambulatory Visit | Attending: Obstetrics and Gynecology | Admitting: Obstetrics and Gynecology

## 2017-01-05 ENCOUNTER — Encounter (HOSPITAL_COMMUNITY)
Admission: RE | Disposition: A | Discharge status: Home / Self Care | Payer: Self-pay | Source: Ambulatory Visit | Attending: Obstetrics and Gynecology

## 2017-01-05 ENCOUNTER — Encounter (HOSPITAL_COMMUNITY): Payer: Self-pay | Admitting: *Deleted

## 2017-01-05 DIAGNOSIS — N8189 Other female genital prolapse: Secondary | ICD-10-CM | POA: Insufficient documentation

## 2017-01-05 DIAGNOSIS — I10 Essential (primary) hypertension: Secondary | ICD-10-CM | POA: Diagnosis not present

## 2017-01-05 DIAGNOSIS — N813 Complete uterovaginal prolapse: Secondary | ICD-10-CM | POA: Insufficient documentation

## 2017-01-05 DIAGNOSIS — Z853 Personal history of malignant neoplasm of breast: Secondary | ICD-10-CM | POA: Insufficient documentation

## 2017-01-05 DIAGNOSIS — Z91048 Other nonmedicinal substance allergy status: Secondary | ICD-10-CM | POA: Diagnosis not present

## 2017-01-05 DIAGNOSIS — N9489 Other specified conditions associated with female genital organs and menstrual cycle: Secondary | ICD-10-CM | POA: Insufficient documentation

## 2017-01-05 DIAGNOSIS — F419 Anxiety disorder, unspecified: Secondary | ICD-10-CM | POA: Insufficient documentation

## 2017-01-05 DIAGNOSIS — Z885 Allergy status to narcotic agent status: Secondary | ICD-10-CM | POA: Insufficient documentation

## 2017-01-05 DIAGNOSIS — Z8249 Family history of ischemic heart disease and other diseases of the circulatory system: Secondary | ICD-10-CM | POA: Insufficient documentation

## 2017-01-05 DIAGNOSIS — G4730 Sleep apnea, unspecified: Secondary | ICD-10-CM | POA: Insufficient documentation

## 2017-01-05 DIAGNOSIS — N3281 Overactive bladder: Secondary | ICD-10-CM | POA: Insufficient documentation

## 2017-01-05 DIAGNOSIS — Z9013 Acquired absence of bilateral breasts and nipples: Secondary | ICD-10-CM | POA: Diagnosis not present

## 2017-01-05 DIAGNOSIS — N888 Other specified noninflammatory disorders of cervix uteri: Secondary | ICD-10-CM | POA: Diagnosis not present

## 2017-01-05 DIAGNOSIS — N736 Female pelvic peritoneal adhesions (postinfective): Secondary | ICD-10-CM | POA: Insufficient documentation

## 2017-01-05 DIAGNOSIS — Z9889 Other specified postprocedural states: Secondary | ICD-10-CM | POA: Diagnosis not present

## 2017-01-05 DIAGNOSIS — N838 Other noninflammatory disorders of ovary, fallopian tube and broad ligament: Secondary | ICD-10-CM | POA: Insufficient documentation

## 2017-01-05 DIAGNOSIS — Z87891 Personal history of nicotine dependence: Secondary | ICD-10-CM | POA: Diagnosis not present

## 2017-01-05 DIAGNOSIS — M797 Fibromyalgia: Secondary | ICD-10-CM | POA: Diagnosis not present

## 2017-01-05 DIAGNOSIS — F329 Major depressive disorder, single episode, unspecified: Secondary | ICD-10-CM | POA: Insufficient documentation

## 2017-01-05 DIAGNOSIS — N858 Other specified noninflammatory disorders of uterus: Secondary | ICD-10-CM | POA: Diagnosis not present

## 2017-01-05 DIAGNOSIS — E039 Hypothyroidism, unspecified: Secondary | ICD-10-CM | POA: Insufficient documentation

## 2017-01-05 DIAGNOSIS — N814 Uterovaginal prolapse, unspecified: Secondary | ICD-10-CM

## 2017-01-05 DIAGNOSIS — K219 Gastro-esophageal reflux disease without esophagitis: Secondary | ICD-10-CM | POA: Insufficient documentation

## 2017-01-05 DIAGNOSIS — Z803 Family history of malignant neoplasm of breast: Secondary | ICD-10-CM | POA: Insufficient documentation

## 2017-01-05 DIAGNOSIS — Z883 Allergy status to other anti-infective agents status: Secondary | ICD-10-CM | POA: Diagnosis not present

## 2017-01-05 DIAGNOSIS — Z880 Allergy status to penicillin: Secondary | ICD-10-CM | POA: Diagnosis not present

## 2017-01-05 DIAGNOSIS — J189 Pneumonia, unspecified organism: Secondary | ICD-10-CM | POA: Diagnosis not present

## 2017-01-05 DIAGNOSIS — Z90710 Acquired absence of both cervix and uterus: Secondary | ICD-10-CM | POA: Diagnosis present

## 2017-01-05 HISTORY — PX: ANTERIOR AND POSTERIOR REPAIR: SHX5121

## 2017-01-05 HISTORY — PX: LAPAROSCOPIC VAGINAL HYSTERECTOMY WITH SALPINGO OOPHORECTOMY: SHX6681

## 2017-01-05 SURGERY — HYSTERECTOMY, VAGINAL, LAPAROSCOPY-ASSISTED, WITH SALPINGO-OOPHORECTOMY
Anesthesia: General | Site: Vagina

## 2017-01-05 MED ORDER — HYDROMORPHONE HCL 1 MG/ML IJ SOLN
INTRAMUSCULAR | Status: AC
  Administered 2017-01-05: 0.25 mg via INTRAVENOUS
  Filled 2017-01-05: qty 1

## 2017-01-05 MED ORDER — HYDROMORPHONE HCL 1 MG/ML IJ SOLN
0.2500 mg | INTRAMUSCULAR | Status: DC | PRN
  Administered 2017-01-05 (×3): 0.25 mg via INTRAVENOUS

## 2017-01-05 MED ORDER — DIPHENHYDRAMINE HCL 12.5 MG/5ML PO ELIX: 12.5000 mg | ORAL_SOLUTION | Freq: Four times a day (QID) | ORAL | Status: DC | PRN

## 2017-01-05 MED ORDER — ONDANSETRON HCL 4 MG PO TABS
4.0000 mg | ORAL_TABLET | Freq: Four times a day (QID) | ORAL | Status: DC | PRN
Start: 2017-01-05 — End: 2017-01-06

## 2017-01-05 MED ORDER — DIPHENHYDRAMINE HCL 50 MG/ML IJ SOLN: 12.5000 mg | Freq: Four times a day (QID) | INTRAMUSCULAR | Status: DC | PRN

## 2017-01-05 MED ORDER — ACETAMINOPHEN 325 MG PO TABS: 650.0000 mg | ORAL_TABLET | ORAL | Status: DC | PRN

## 2017-01-05 MED ORDER — PROMETHAZINE HCL 25 MG/ML IJ SOLN
6.2500 mg | Freq: Once | INTRAMUSCULAR | Status: AC
  Administered 2017-01-05: 6.25 mg via INTRAVENOUS

## 2017-01-05 MED ORDER — MIDAZOLAM HCL 2 MG/2ML IJ SOLN
INTRAMUSCULAR | Status: DC | PRN
  Administered 2017-01-05 (×2): 1 mg via INTRAVENOUS

## 2017-01-05 MED ORDER — ONDANSETRON HCL 4 MG/2ML IJ SOLN
INTRAMUSCULAR | Status: DC | PRN
  Administered 2017-01-05: 4 mg via INTRAVENOUS

## 2017-01-05 MED ORDER — LIDOCAINE HCL (CARDIAC) 20 MG/ML IV SOLN
INTRAVENOUS | Status: DC | PRN
  Administered 2017-01-05: 100 mg via INTRAVENOUS

## 2017-01-05 MED ORDER — PROPOFOL 10 MG/ML IV BOLUS
INTRAVENOUS | Status: DC | PRN
  Administered 2017-01-05: 100 mg via INTRAVENOUS

## 2017-01-05 MED ORDER — MENTHOL 3 MG MT LOZG: 1.0000 | LOZENGE | OROMUCOSAL | Status: DC | PRN

## 2017-01-05 MED ORDER — HYDROMORPHONE 1 MG/ML IV SOLN
INTRAVENOUS | Status: DC
  Administered 2017-01-05: 14:00:00 via INTRAVENOUS
  Administered 2017-01-05: 0.6 mg via INTRAVENOUS
  Administered 2017-01-05: 1 mg via INTRAVENOUS
  Administered 2017-01-06: 0.6 mg via INTRAVENOUS
  Administered 2017-01-06: 1.6 mg via INTRAVENOUS
  Filled 2017-01-05: qty 25

## 2017-01-05 MED ORDER — VASOPRESSIN 20 UNIT/ML IV SOLN
INTRAVENOUS | Status: AC
  Filled 2017-01-05: qty 1

## 2017-01-05 MED ORDER — ONDANSETRON HCL 4 MG/2ML IJ SOLN: 4.0000 mg | Freq: Four times a day (QID) | INTRAMUSCULAR | Status: DC | PRN

## 2017-01-05 MED ORDER — LIDOCAINE HCL (PF) 1 % IJ SOLN
INTRAMUSCULAR | Status: AC
  Filled 2017-01-05: qty 5

## 2017-01-05 MED ORDER — MIDAZOLAM HCL 2 MG/2ML IJ SOLN
INTRAMUSCULAR | Status: AC
  Filled 2017-01-05: qty 2

## 2017-01-05 MED ORDER — SUGAMMADEX SODIUM 200 MG/2ML IV SOLN
INTRAVENOUS | Status: AC
  Filled 2017-01-05: qty 2

## 2017-01-05 MED ORDER — PROMETHAZINE HCL 25 MG/ML IJ SOLN
INTRAMUSCULAR | Status: AC
  Filled 2017-01-05: qty 1

## 2017-01-05 MED ORDER — LACTATED RINGERS IV SOLN
INTRAVENOUS | Status: DC
  Administered 2017-01-05 (×2): via INTRAVENOUS

## 2017-01-05 MED ORDER — KETOROLAC TROMETHAMINE 30 MG/ML IJ SOLN
INTRAMUSCULAR | Status: AC
  Filled 2017-01-05: qty 1

## 2017-01-05 MED ORDER — ONDANSETRON HCL 4 MG/2ML IJ SOLN
INTRAMUSCULAR | Status: AC
  Filled 2017-01-05: qty 2

## 2017-01-05 MED ORDER — SODIUM CHLORIDE 0.9% FLUSH: 9.0000 mL | INTRAVENOUS | Status: DC | PRN

## 2017-01-05 MED ORDER — FENTANYL CITRATE (PF) 100 MCG/2ML IJ SOLN
INTRAMUSCULAR | Status: DC | PRN
  Administered 2017-01-05: 50 ug via INTRAVENOUS
  Administered 2017-01-05: 100 ug via INTRAVENOUS
  Administered 2017-01-05: 50 ug via INTRAVENOUS

## 2017-01-05 MED ORDER — DEXAMETHASONE SODIUM PHOSPHATE 10 MG/ML IJ SOLN
INTRAMUSCULAR | Status: DC | PRN
  Administered 2017-01-05: 4 mg via INTRAVENOUS

## 2017-01-05 MED ORDER — BUPIVACAINE HCL (PF) 0.25 % IJ SOLN
INTRAMUSCULAR | Status: AC
  Filled 2017-01-05: qty 30

## 2017-01-05 MED ORDER — SIMETHICONE 80 MG PO CHEW: 80.0000 mg | CHEWABLE_TABLET | Freq: Four times a day (QID) | ORAL | Status: DC | PRN

## 2017-01-05 MED ORDER — FENTANYL CITRATE (PF) 250 MCG/5ML IJ SOLN
INTRAMUSCULAR | Status: AC
  Filled 2017-01-05: qty 5

## 2017-01-05 MED ORDER — PROPOFOL 10 MG/ML IV BOLUS
INTRAVENOUS | Status: AC
  Filled 2017-01-05: qty 20

## 2017-01-05 MED ORDER — LACTATED RINGERS IV SOLN
INTRAVENOUS | Status: DC
  Administered 2017-01-05: 07:00:00 via INTRAVENOUS

## 2017-01-05 MED ORDER — ONDANSETRON HCL 4 MG/2ML IJ SOLN: 4.0000 mg | Freq: Once | INTRAMUSCULAR | Status: DC | PRN

## 2017-01-05 MED ORDER — ESTRADIOL 0.1 MG/GM VA CREA
TOPICAL_CREAM | VAGINAL | Status: DC | PRN
  Administered 2017-01-05: 1 via VAGINAL

## 2017-01-05 MED ORDER — ROCURONIUM BROMIDE 100 MG/10ML IV SOLN
INTRAVENOUS | Status: DC | PRN
  Administered 2017-01-05: 50 mg via INTRAVENOUS

## 2017-01-05 MED ORDER — SODIUM CHLORIDE 0.9 % IJ SOLN
INTRAMUSCULAR | Status: DC | PRN
  Administered 2017-01-05: 10 mL

## 2017-01-05 MED ORDER — NALOXONE HCL 0.4 MG/ML IJ SOLN: 0.4000 mg | INTRAMUSCULAR | Status: DC | PRN

## 2017-01-05 MED ORDER — VASOPRESSIN 20 UNIT/ML IV SOLN
INTRAVENOUS | Status: DC | PRN
  Administered 2017-01-05: 3 mL via INTRAMUSCULAR
  Administered 2017-01-05: 13 mL via INTRAMUSCULAR

## 2017-01-05 MED ORDER — OXYCODONE-ACETAMINOPHEN 5-325 MG PO TABS
1.0000 | ORAL_TABLET | ORAL | Status: DC | PRN
  Administered 2017-01-06: 1 via ORAL
  Filled 2017-01-05: qty 1

## 2017-01-05 MED ORDER — ESTRADIOL 0.1 MG/GM VA CREA
TOPICAL_CREAM | VAGINAL | Status: AC
  Filled 2017-01-05: qty 42.5

## 2017-01-05 MED ORDER — BUPIVACAINE HCL (PF) 0.25 % IJ SOLN
INTRAMUSCULAR | Status: DC | PRN
  Administered 2017-01-05: 4 mL

## 2017-01-05 MED ORDER — MEPERIDINE HCL 25 MG/ML IJ SOLN: 6.2500 mg | INTRAMUSCULAR | Status: DC | PRN

## 2017-01-05 MED ORDER — PHENYLEPHRINE HCL 10 MG/ML IJ SOLN
INTRAMUSCULAR | Status: DC | PRN
  Administered 2017-01-05: .08 mg via INTRAVENOUS
  Administered 2017-01-05: .04 mg via INTRAVENOUS

## 2017-01-05 MED ORDER — SUGAMMADEX SODIUM 200 MG/2ML IV SOLN
INTRAVENOUS | Status: DC | PRN
  Administered 2017-01-05: 150 mg via INTRAVENOUS

## 2017-01-05 MED ORDER — DEXAMETHASONE SODIUM PHOSPHATE 4 MG/ML IJ SOLN
INTRAMUSCULAR | Status: AC
  Filled 2017-01-05: qty 1

## 2017-01-05 MED ORDER — ALUM & MAG HYDROXIDE-SIMETH 200-200-20 MG/5ML PO SUSP: 30.0000 mL | ORAL | Status: DC | PRN

## 2017-01-05 MED ORDER — ACETAMINOPHEN 10 MG/ML IV SOLN
1000.0000 mg | Freq: Once | INTRAVENOUS | Status: AC
  Administered 2017-01-05: 1000 mg via INTRAVENOUS
  Filled 2017-01-05: qty 100

## 2017-01-05 MED ORDER — LACTATED RINGERS IV SOLN
INTRAVENOUS | Status: DC
  Administered 2017-01-05: 14:00:00 via INTRAVENOUS
  Administered 2017-01-05: 100 mL/h via INTRAVENOUS

## 2017-01-05 SURGICAL SUPPLY — 58 items
BLADE SURG 15 STRL LF C SS BP (BLADE) IMPLANT
BLADE SURG 15 STRL SS (BLADE) ×3
CABLE HIGH FREQUENCY MONO STRZ (ELECTRODE) IMPLANT
CATH ROBINSON RED A/P 16FR (CATHETERS) IMPLANT
CLOTH BEACON ORANGE TIMEOUT ST (SAFETY) ×3 IMPLANT
CONT PATH 16OZ SNAP LID 3702 (MISCELLANEOUS) ×3 IMPLANT
COVER BACK TABLE 60X90IN (DRAPES) ×3 IMPLANT
COVER MAYO STAND STRL (DRAPES) ×1 IMPLANT
COVER TABLE BACK 60X90 (DRAPES) ×1 IMPLANT
DECANTER SPIKE VIAL GLASS SM (MISCELLANEOUS) ×2 IMPLANT
DRSG OPSITE POSTOP 3X4 (GAUZE/BANDAGES/DRESSINGS) ×1 IMPLANT
DURAPREP 26ML APPLICATOR (WOUND CARE) ×3 IMPLANT
ELECT REM PT RETURN 9FT ADLT (ELECTROSURGICAL) ×3
ELECTRODE REM PT RTRN 9FT ADLT (ELECTROSURGICAL) IMPLANT
FILTER SMOKE EVAC LAPAROSHD (FILTER) IMPLANT
GAUZE PACKING 2X5 YD STRL (GAUZE/BANDAGES/DRESSINGS) ×4 IMPLANT
GLOVE BIO SURGEON STRL SZ 6.5 (GLOVE) ×9 IMPLANT
GLOVE BIOGEL PI IND STRL 6.5 (GLOVE) ×2 IMPLANT
GLOVE BIOGEL PI IND STRL 7.0 (GLOVE) ×6 IMPLANT
GLOVE BIOGEL PI INDICATOR 6.5 (GLOVE) ×1
GLOVE BIOGEL PI INDICATOR 7.0 (GLOVE) ×3
GOWN STRL REUS W/TWL LRG LVL3 (GOWN DISPOSABLE) ×15 IMPLANT
LEGGING LITHOTOMY PAIR STRL (DRAPES) ×3 IMPLANT
NDL MAYO CATGUT SZ4 TPR NDL (NEEDLE) IMPLANT
NEEDLE HYPO 22GX1.5 SAFETY (NEEDLE) IMPLANT
NEEDLE INSUFFLATION 120MM (ENDOMECHANICALS) ×3 IMPLANT
NEEDLE MAYO CATGUT SZ4 (NEEDLE) IMPLANT
NS IRRIG 1000ML POUR BTL (IV SOLUTION) ×3 IMPLANT
PACK LAVH (CUSTOM PROCEDURE TRAY) ×3 IMPLANT
PACK ROBOTIC GOWN (GOWN DISPOSABLE) ×3 IMPLANT
PACK TRENDGUARD 450 HYBRID PRO (MISCELLANEOUS) IMPLANT
PACK TRENDGUARD 600 HYBRD PROC (MISCELLANEOUS) IMPLANT
PACK VAGINAL WOMENS (CUSTOM PROCEDURE TRAY) ×2 IMPLANT
PROTECTOR NERVE ULNAR (MISCELLANEOUS) ×6 IMPLANT
SET IRRIG TUBING LAPAROSCOPIC (IRRIGATION / IRRIGATOR) ×3 IMPLANT
SHEARS HARMONIC ACE PLUS 36CM (ENDOMECHANICALS) ×3 IMPLANT
SLEEVE XCEL OPT CAN 5 100 (ENDOMECHANICALS) ×4 IMPLANT
STRIP CLOSURE SKIN 1/4X3 (GAUZE/BANDAGES/DRESSINGS) IMPLANT
SUT PROLENE 1 CT 1 30 (SUTURE) IMPLANT
SUT SILK 0 FSL (SUTURE) ×3 IMPLANT
SUT VIC AB 0 CT1 18XCR BRD8 (SUTURE) ×4 IMPLANT
SUT VIC AB 0 CT1 27 (SUTURE) ×3
SUT VIC AB 0 CT1 27XBRD ANBCTR (SUTURE) IMPLANT
SUT VIC AB 0 CT1 8-18 (SUTURE) ×6
SUT VIC AB 2-0 CT1 (SUTURE) ×4 IMPLANT
SUT VIC AB 2-0 CT1 27 (SUTURE) ×3
SUT VIC AB 2-0 CT1 TAPERPNT 27 (SUTURE) IMPLANT
SUT VIC AB 2-0 UR5 27 (SUTURE) IMPLANT
SUT VICRYL 0 TIES 12 18 (SUTURE) IMPLANT
SUT VICRYL 0 UR6 27IN ABS (SUTURE) ×1 IMPLANT
SUT VICRYL 4-0 PS2 18IN ABS (SUTURE) ×3 IMPLANT
TOWEL OR 17X24 6PK STRL BLUE (TOWEL DISPOSABLE) ×6 IMPLANT
TRAY FOLEY CATH SILVER 14FR (SET/KITS/TRAYS/PACK) ×3 IMPLANT
TRENDGUARD 450 HYBRID PRO PACK (MISCELLANEOUS) ×3
TRENDGUARD 600 HYBRID PROC PK (MISCELLANEOUS)
TROCAR XCEL NON-BLD 5MMX100MML (ENDOMECHANICALS) ×3 IMPLANT
WARMER LAPAROSCOPE (MISCELLANEOUS) ×3 IMPLANT
WATER STERILE IRR 1000ML POUR (IV SOLUTION) ×3 IMPLANT

## 2017-01-05 NOTE — Anesthesia Procedure Notes (Signed)
Procedure Name: Intubation Date/Time: 01/05/2017 7:37 AM Performed by: Bufford Spikes Pre-anesthesia Checklist: Patient identified, Emergency Drugs available, Suction available and Patient being monitored Patient Re-evaluated:Patient Re-evaluated prior to inductionOxygen Delivery Method: Circle system utilized Preoxygenation: Pre-oxygenation with 100% oxygen Intubation Type: IV induction Ventilation: Mask ventilation without difficulty Laryngoscope Size: [place] and 2 Grade View: Grade I Tube type: Oral Tube size: 7.0 mm Number of attempts: 1 Airway Equipment and Method: Stylet Placement Confirmation: ETT inserted through vocal cords under direct vision,  positive ETCO2 and breath sounds checked- equal and bilateral Secured at: 21 cm Tube secured with: Tape Dental Injury: Teeth and Oropharynx as per pre-operative assessment

## 2017-01-05 NOTE — Anesthesia Preprocedure Evaluation (Signed)
Anesthesia Evaluation  Patient identified by MRN, date of birth, ID band Patient awake    Reviewed: Allergy & Precautions, NPO status , Patient's Chart, lab work & pertinent test results  Airway Mallampati: I  TM Distance: >3 FB Neck ROM: Full    Dental   Pulmonary sleep apnea , former smoker,    Pulmonary exam normal        Cardiovascular hypertension, Pt. on medications Normal cardiovascular exam     Neuro/Psych    GI/Hepatic GERD  Controlled,  Endo/Other    Renal/GU      Musculoskeletal   Abdominal   Peds  Hematology   Anesthesia Other Findings   Reproductive/Obstetrics                             Anesthesia Physical Anesthesia Plan  ASA: III  Anesthesia Plan: General   Post-op Pain Management:    Induction: Intravenous  Airway Management Planned: Oral ETT  Additional Equipment:   Intra-op Plan:   Post-operative Plan: Extubation in OR  Informed Consent: I have reviewed the patients History and Physical, chart, labs and discussed the procedure including the risks, benefits and alternatives for the proposed anesthesia with the patient or authorized representative who has indicated his/her understanding and acceptance.     Plan Discussed with: CRNA and Surgeon  Anesthesia Plan Comments:         Anesthesia Quick Evaluation

## 2017-01-05 NOTE — Anesthesia Postprocedure Evaluation (Signed)
Anesthesia Post Note  Patient: Simone Curia  Procedure(s) Performed: Procedure(s) (LRB): LAPAROSCOPIC ASSISTED VAGINAL HYSTERECTOMY WITH SALPINGO OOPHORECTOMY (Bilateral) ANTERIOR (CYSTOCELE) AND POSTERIOR REPAIR (RECTOCELE) (N/A)  Patient location during evaluation: PACU Anesthesia Type: General Level of consciousness: awake and alert Pain management: pain level controlled Vital Signs Assessment: post-procedure vital signs reviewed and stable Respiratory status: spontaneous breathing, nonlabored ventilation, respiratory function stable and patient connected to nasal cannula oxygen Cardiovascular status: blood pressure returned to baseline and stable Postop Assessment: no signs of nausea or vomiting Anesthetic complications: no        Last Vitals:  Vitals:   01/05/17 1130 01/05/17 1145  BP: (!) 141/76 (!) 143/75  Pulse: 79 81  Resp: 15 19  Temp:      Last Pain:  Vitals:   01/05/17 1145  TempSrc:   PainSc: Asleep   Pain Goal: Patients Stated Pain Goal: 4 (01/05/17 1145)               Jarone Ostergaard DAVID

## 2017-01-05 NOTE — Transfer of Care (Signed)
Immediate Anesthesia Transfer of Care Note  Patient: Kim Chavez  Procedure(s) Performed: Procedure(s): LAPAROSCOPIC ASSISTED VAGINAL HYSTERECTOMY WITH SALPINGO OOPHORECTOMY (Bilateral) ANTERIOR (CYSTOCELE) AND POSTERIOR REPAIR (RECTOCELE) (N/A)  Patient Location: PACU  Anesthesia Type:General  Level of Consciousness: awake, alert  and oriented  Airway & Oxygen Therapy: Patient Spontanous Breathing and Patient connected to nasal cannula oxygen  Post-op Assessment: Report given to RN and Post -op Vital signs reviewed and stable  Post vital signs: Reviewed and stable  Last Vitals:  Vitals:   01/05/17 0612  BP: (!) 149/87  Pulse: 84  Resp: 16  Temp: 36.8 C    Last Pain:  Vitals:   01/05/17 0612  TempSrc: Oral  PainSc: 4       Patients Stated Pain Goal: 4 (99/77/41 4239)  Complications: No apparent anesthesia complications

## 2017-01-05 NOTE — Op Note (Signed)
Operative Note    Preoperative Diagnosis Pelvic prolapse  H/o breast cancer  Postoperative Diagnosis same  Procedure Laparoscopic assisted vaginal hysterectomy with bilateral salpingectomies Anterior and Posterior Repair  Surgeon Paula Compton, MD  Anesthesia GETA  Fluids: EBL 194m UOP 2080mIVF 120011mFindings Small uterus with Grade 2-3 prolapse and normal tubes and ovaries Small amount of right pelvic adhesions Cystocele and rectocele   Specimen Uterus tubes and ovaries  Procedure Note Patient was taken to the operating room where general anesthesia was obtained without difficulty. She was then prepped and draped in the normal sterile fashion in the dorsal lithotomy position. An appropriate timeout was performed. A speculum was then placed within the vagina and a Hulka tenaculum placed within the cervix for uterine manipulation. A foley catheter was placed in the bladder.  Attention was then turned to the patient's abdomen after draping where the infraumbilical area was injected with approximately 10 cc of quarter percent Marcaine. A 1 cm incision was then made within the umbilicus and the varies needle easily introduced into the peritoneal cavity. Intraperitoneal placement was confirmed by aspiration and injection with normal saline. Gas flow was then applied and a pneumoperitoneum obtained with approximate 3 L of CO2 gas. The varies needle was then removed and a 5 mm optiview trocar was easily introduced into the abdomen under direct visualization. . With patient in Trendelenburg the uterus and tubes and ovaries were inspected with findings as previously stated. Two additional trocars were placed in the upper lateral quadrants under direct visualization after injection with quarter percent marcaine.  THe Harmonic scalpel was then utilized to dissect the fallopian tubes from the mesosalpinx bilaterally down to the level of the cornua.  The remainder of the uteroovarian  ligament and the round ligament were then also taken down with the Harmonic to the level of the bladder flap.  The bladder flap was taken down from the lower uterine segment and pushed away to expose the cervix.      Attention was then turned to the vagina after all instruments were removed and the trocars covered with a sterile drape. The cervix was grasped with JacYates Decampnaculums x 2 and injected with a dilute solution of Pitressin circumferentially.  The bovie was then used to make a circumferential incision.  The mayo scissors then further dissected the vaginal mucosa from the underlying cervix and the anterior and posterior cul de sac entered sharply.  With a banana speculum and deaver retractor isolating the uterus from the bladder and rectum.  The uterosacral ligaments and paracervical tissue was taken down sequentially with parametrial clamps and suture ligated with zero vicryl at each step.  When  the was uterus freed on the patient's left, it was then delivered and the remaining tissue on the right clamped and transected completely freeing the uterus and tubes.  It was handed off to pathology.   The uterosacral ligaments were approximated with zero vicryl.  The short weighted speculum was placed and the posterior cuff run with a running locked 2-0 vicryl for hemostasis.    Attention was then turned to the anterior vaginal cuff which was grasped with alyss clamps and a midline incision begun after injection with dilute pitressin.  The vaginal mucosa was underscored and dilated off the pubo-vesicle fascia with the cystocele freed from the mucosa.  The fascia was then re-approximated with 0 vicryl interrupted sutures and the excess mucosa trimmed away.  The mucosa and the vaginal cuff were then closed with 2-0  vicryl in a running fashion.  Attention was finally turned posteriorly where the introitus was denuded and underscored in the midline with mayo scissors.  The vaginal mucosa was then dissected off  the underlying puborectal fascia and reflected away from the midline.  The rectocele was then reduced and the puborectal fascia re-approximated with 0 vicryl interrupted sutures. The excess mucosa was then trimmed away and then closed with 2-0 vicryl in a running locked suture.  All appeared hemostatic and estrace coated vaginal packing placed.    Gowns and gloves were changed and attention was returned to the abdomen, where pneumoperitoneum was again obtained and all inspected.  The cuff and pedicles were hemostatic and the ureters visualized and normal in appearance. A four quadrant view of the pelvis and abdomen was performed and found to be normal with no bleeding or injuries noted.  The instruments were removed from the abdomen as well as the 5 mm lateral ports under visualization.  The pneumoperitoneum was reduced through the trocar. The trocar was finally removed and the infraumbilical incision and lateral incisions were closed with a subcuticular stitch of 3-0 Vicryl. Dermabond and a bandage were placed. Patient was then awakened and taken to the recovery room in good conditionSponge, instrument and needle counts were correct.    Patient was then awakened and taken to the recovery room in good condition.

## 2017-01-05 NOTE — Progress Notes (Signed)
Day of Surgery Procedure(s) (LRB): LAPAROSCOPIC ASSISTED VAGINAL HYSTERECTOMY WITH SALPINGO OOPHORECTOMY (Bilateral) ANTERIOR (CYSTOCELE) AND POSTERIOR REPAIR (RECTOCELE) (N/A)  Subjective: Patient reports some pain intermittently but controlled on PCA now.  Had earlier nausea, resolved now.  Foley in place and draining well.   Objective: I have reviewed patient's vital signs and intake and output.  General: alert and cooperative GI: soft NT  Assessment: s/p Procedure(s): LAPAROSCOPIC ASSISTED VAGINAL HYSTERECTOMY WITH SALPINGO OOPHORECTOMY (Bilateral) ANTERIOR (CYSTOCELE) AND POSTERIOR REPAIR (RECTOCELE) (N/A): stable  Plan: Pt stable post surgery.  Will transition to po meds in AM and increase ambulation, remove foley and gauze in AM.  Pt lost her CPAP machine months ago, so does not have one to use.  LOS: 0 days    Rosamond Andress W 01/05/2017, 6:09 PM

## 2017-01-05 NOTE — Progress Notes (Addendum)
Patient ID: CARALEE MOREA, female   DOB: 08/16/1946, 71 y.o.   MRN: [id number] Per patient no changes in dictated H&P and ready to proceed.  Brief exam WNL.  She states colitis stable now.  Had an intestinal virus last week but has been able to eat for last 3-4 days fine.  BW last week ok except potassium a bit low but has been replacing.  Cannot take NSAID's.

## 2017-01-06 ENCOUNTER — Encounter (HOSPITAL_COMMUNITY): Payer: Self-pay | Admitting: Obstetrics and Gynecology

## 2017-01-06 DIAGNOSIS — N813 Complete uterovaginal prolapse: Secondary | ICD-10-CM | POA: Diagnosis not present

## 2017-01-06 DIAGNOSIS — N838 Other noninflammatory disorders of ovary, fallopian tube and broad ligament: Secondary | ICD-10-CM | POA: Diagnosis not present

## 2017-01-06 DIAGNOSIS — N888 Other specified noninflammatory disorders of cervix uteri: Secondary | ICD-10-CM | POA: Diagnosis not present

## 2017-01-06 DIAGNOSIS — N736 Female pelvic peritoneal adhesions (postinfective): Secondary | ICD-10-CM | POA: Diagnosis not present

## 2017-01-06 DIAGNOSIS — N814 Uterovaginal prolapse, unspecified: Secondary | ICD-10-CM

## 2017-01-06 DIAGNOSIS — Z853 Personal history of malignant neoplasm of breast: Secondary | ICD-10-CM | POA: Diagnosis not present

## 2017-01-06 DIAGNOSIS — N8189 Other female genital prolapse: Secondary | ICD-10-CM | POA: Diagnosis not present

## 2017-01-06 LAB — BASIC METABOLIC PANEL
Anion gap: 5 (ref 5–15)
BUN: 12 mg/dL (ref 6–20)
CHLORIDE: 102 mmol/L (ref 101–111)
CO2: 28 mmol/L (ref 22–32)
CREATININE: 0.8 mg/dL (ref 0.44–1.00)
Calcium: 8.7 mg/dL — ABNORMAL LOW (ref 8.9–10.3)
GFR calc Af Amer: >60 mL/min (ref >60)
GFR calc non Af Amer: >60 mL/min (ref >60)
Glucose, Bld: 106 mg/dL — ABNORMAL HIGH (ref 65–99)
POTASSIUM: 4.6 mmol/L (ref 3.5–5.1)
Sodium: 135 mmol/L (ref 135–145)

## 2017-01-06 LAB — CBC
HEMATOCRIT: 36.1 % (ref 36.0–46.0)
HEMOGLOBIN: 12.3 g/dL (ref 12.0–15.0)
MCH: 27.2 pg (ref 26.0–34.0)
MCHC: 34.1 g/dL (ref 30.0–36.0)
MCV: 79.9 fL (ref 78.0–100.0)
Platelets: 259 10*3/uL (ref 150–400)
RBC: 4.52 MIL/uL (ref 3.87–5.11)
RDW: 13.6 % (ref 11.5–15.5)
WBC: 12.2 10*3/uL — ABNORMAL HIGH (ref 4.0–10.5)

## 2017-01-06 MED ORDER — OXYCODONE-ACETAMINOPHEN 5-325 MG PO TABS: 1.0000 | ORAL_TABLET | ORAL | 0 refills | Status: DC | PRN

## 2017-01-06 NOTE — Anesthesia Postprocedure Evaluation (Signed)
Anesthesia Post Note  Patient: Simone Curia  Procedure(s) Performed: Procedure(s) (LRB): LAPAROSCOPIC ASSISTED VAGINAL HYSTERECTOMY WITH SALPINGO OOPHORECTOMY (Bilateral) ANTERIOR (CYSTOCELE) AND POSTERIOR REPAIR (RECTOCELE) (N/A)  Patient location during evaluation: [facility] Anesthesia Type: General Level of consciousness: awake and alert Pain management: satisfactory to patient Vital Signs Assessment: post-procedure vital signs reviewed and stable Respiratory status: spontaneous breathing and respiratory function stable Cardiovascular status: stable Postop Assessment: adequate PO intake Anesthetic complications: no        Last Vitals:  Vitals:   01/06/17 0517 01/06/17 0700  BP:    Pulse:    Resp: 12 10  Temp:      Last Pain:  Vitals:   01/06/17 0730  TempSrc:   PainSc: Asleep   Pain Goal: Patients Stated Pain Goal: 4 (01/06/17 0403)               Katherina Mires

## 2017-01-06 NOTE — Discharge Summary (Signed)
Physician Discharge Summary  Patient ID: Kim Chavez MRN: [id number] DOB/AGE: [birth year] 71 y.o.  Admit date: 01/05/2017 Discharge date: 01/06/2017  Admission Diagnoses: Pelvic Prolapse History of breast Cancer  Discharge Diagnoses:  Active Problems:   Status post laparoscopic assisted vaginal hysterectomy (LAVH)   Pelvic relaxation due to uterine prolapse   History of breast cancer BSO and Anterior and Posterior repair  Discharged Condition: good  Hospital Course: Pt admitted for routine post operative care s/p LAVH/BSO A & P Repair.  She did well and on postoperative day 1 was transitioned to po medications and able to ambulate.  She will have a voiding trial and if no problems, go home this PM  Consults: None  Significant Diagnostic Studies: labs: CBC BMP  Treatments: surgery: LAVH/BSO and Anterior and Posterior repair  Discharge Exam: Blood pressure 126/64, pulse 90, temperature 99.2 F (37.3 C), temperature source Oral, resp. rate 10, height 5' 3"  (1.6 m), weight 72.1 kg (159 lb), SpO2 93 %. General appearance: alert and cooperative GI: soft NT Incision/Wound: C/D/I   Disposition: 01-Home or Self Care  Discharge Instructions    Call MD for:  persistant nausea and vomiting    Complete by:  As directed    Call MD for:  redness, tenderness, or signs of infection (pain, swelling, redness, odor or green/yellow discharge around incision site)    Complete by:  As directed    Call MD for:  severe uncontrolled pain    Complete by:  As directed    Call MD for:  temperature >100.4    Complete by:  As directed    Diet - low sodium heart healthy    Complete by:  As directed    Discharge instructions    Complete by:  As directed    Nothing in vagina for 6 weeks.  No sex, tampons, and douching.  Avoid driving for at least 1-2 weeks or until off narcotic pain meds.  No heavy lifting greater than 10 lbs.  Shower over incision and pat dry.   Increase activity slowly     Complete by:  As directed      Allergies as of 01/06/2017      Reactions   Erythromycin Nausea And Vomiting   Tape Rash   Codeine Nausea And Vomiting   "deathly sick on stomach"    Ibuprofen Other (See Comments)   Has Crohn's and was instructed not to take.    Neosporin [neomycin-bacitracin Zn-polymyx] Itching, Other (See Comments)   Turns red around the site and itches like crazy for about two weeks.    Penicillins Swelling   Has patient had a PCN reaction causing immediate rash, facial/tongue/throat swelling, SOB or lightheadedness with hypotension: Yes- "swell up all over"  Has patient had a PCN reaction causing severe rash involving mucus membranes or skin necrosis: No Has patient had a PCN reaction that required hospitalization No Has patient had a PCN reaction occurring within the last 10 years: No If all of the above answers are "NO", then may proceed with Cephalosporin use.      Medication List    TAKE these medications   albuterol 108 (90 Base) MCG/ACT inhaler Commonly known as:  PROVENTIL HFA;VENTOLIN HFA Inhale 1-2 puffs into the lungs every 6 (six) hours as needed for wheezing or shortness of breath.   amitriptyline 75 MG tablet Commonly known as:  ELAVIL Take 75 mg by mouth at bedtime.   cetirizine 10 MG tablet Commonly known as:  ZYRTEC Take  10 mg by mouth daily as needed for allergies.   Cholecalciferol 5000 units Tabs Take 1 tablet by mouth every morning.   Co Q-10 100 MG Caps Take 1 tablet by mouth every morning.   diazepam 5 MG tablet Commonly known as:  VALIUM Take 5 mg by mouth every 6 (six) hours as needed for anxiety.   escitalopram 10 MG tablet Commonly known as:  LEXAPRO Take 10 mg by mouth every morning.   fluticasone 50 MCG/ACT nasal spray Commonly known as:  FLONASE Place 2 sprays into both nostrils daily as needed for allergies.   levothyroxine 50 MCG tablet Commonly known as:  SYNTHROID, LEVOTHROID Take 50 mcg by mouth daily before  breakfast.   mesalamine 500 MG CR capsule Commonly known as:  PENTASA Take 500 mg by mouth 4 (four) times daily.   multivitamin with minerals Tabs tablet Take 1 tablet by mouth every morning.   omeprazole 20 MG capsule Commonly known as:  PRILOSEC Take 20 mg by mouth 2 (two) times daily before a meal.   ondansetron 4 MG tablet Commonly known as:  ZOFRAN Take 1 tablet (4 mg total) by mouth every 6 (six) hours. What changed:  when to take this  reasons to take this   oxyCODONE-acetaminophen 5-325 MG tablet Commonly known as:  PERCOCET/ROXICET Take 1-2 tablets by mouth every 4 (four) hours as needed (moderate to severe pain (when tolerating fluids)).   PROBIOTIC DAILY PO Take 1 tablet by mouth every morning.   promethazine 12.5 MG tablet Commonly known as:  PHENERGAN Take 12.5 mg by mouth every 6 (six) hours as needed for nausea or vomiting.   simvastatin 20 MG tablet Commonly known as:  ZOCOR Take 20 mg by mouth at bedtime.      Follow-up Information    Logan Bores, MD. Schedule an appointment as soon as possible for a visit in 2 week(s).   Specialty:  Obstetrics and Gynecology Why:  Incision check Contact information: [address] [place] [address] [phone]           Signed: Logan Bores 01/06/2017, 8:05 AM

## 2017-01-06 NOTE — Addendum Note (Signed)
Addendum  created 01/06/17 0757 by Flossie Dibble, CRNA   Sign clinical note

## 2017-01-06 NOTE — Progress Notes (Signed)
1 Day Post-Op Procedure(s) (LRB): LAPAROSCOPIC ASSISTED VAGINAL HYSTERECTOMY WITH SALPINGO OOPHORECTOMY (Bilateral) ANTERIOR (CYSTOCELE) AND POSTERIOR REPAIR (RECTOCELE) (N/A)  Subjective: Patient reports pain improved today, still hurts when does incentive spirometer.  She has walked and no dizziness, does state she feels weak.  Tolerated a grilled cheese last night with no N/V.    Objective: I have reviewed patient's vital signs and labs and UOP.  General: alert and cooperative GI: soft NT Incisions well-approximated Vaginal Bleeding: minimal, vaginal packing removed  Assessment: s/p Procedure(s): LAPAROSCOPIC ASSISTED VAGINAL HYSTERECTOMY WITH SALPINGO OOPHORECTOMY (Bilateral) ANTERIOR (CYSTOCELE) AND POSTERIOR REPAIR (RECTOCELE) (N/A): stable  Plan: Advance diet Encourage ambulation Advance to PO medication  D/c foley Will plan on PM d/c if patient able to void and ambulate today  LOS: 0 days    Maelin Kurkowski W 01/06/2017, 7:57 AM

## 2017-01-06 NOTE — Progress Notes (Signed)
Discharge teaching complete. Pt understood all instructions and did not have any questions. Pt pushed via wheelchair and discharged home to family.

## 2017-02-09 DIAGNOSIS — H5203 Hypermetropia, bilateral: Secondary | ICD-10-CM | POA: Diagnosis not present

## 2017-02-09 DIAGNOSIS — H40033 Anatomical narrow angle, bilateral: Secondary | ICD-10-CM | POA: Diagnosis not present

## 2017-02-16 ENCOUNTER — Ambulatory Visit (INDEPENDENT_AMBULATORY_CARE_PROVIDER_SITE_OTHER): Payer: Medicare Other | Admitting: Neurology

## 2017-02-16 ENCOUNTER — Encounter: Payer: Self-pay | Admitting: Neurology

## 2017-02-16 VITALS — BP 144/86 | HR 106 | Resp 20 | Ht 63.0 in | Wt 157.0 lb

## 2017-02-16 DIAGNOSIS — Z9013 Acquired absence of bilateral breasts and nipples: Secondary | ICD-10-CM

## 2017-02-16 DIAGNOSIS — G4736 Sleep related hypoventilation in conditions classified elsewhere: Secondary | ICD-10-CM | POA: Diagnosis not present

## 2017-02-16 DIAGNOSIS — G44019 Episodic cluster headache, not intractable: Secondary | ICD-10-CM | POA: Insufficient documentation

## 2017-02-16 DIAGNOSIS — R51 Headache: Secondary | ICD-10-CM

## 2017-02-16 DIAGNOSIS — G43011 Migraine without aura, intractable, with status migrainosus: Secondary | ICD-10-CM

## 2017-02-16 DIAGNOSIS — T402X5A Adverse effect of other opioids, initial encounter: Secondary | ICD-10-CM

## 2017-02-16 DIAGNOSIS — K5903 Drug induced constipation: Secondary | ICD-10-CM | POA: Diagnosis not present

## 2017-02-16 DIAGNOSIS — R519 Headache, unspecified: Secondary | ICD-10-CM

## 2017-02-16 DIAGNOSIS — Z79899 Other long term (current) drug therapy: Secondary | ICD-10-CM

## 2017-02-16 DIAGNOSIS — G4733 Obstructive sleep apnea (adult) (pediatric): Secondary | ICD-10-CM | POA: Diagnosis not present

## 2017-02-16 MED ORDER — DIAZEPAM 5 MG PO TABS: 5.0000 mg | ORAL_TABLET | Freq: Four times a day (QID) | ORAL | 1 refills | Status: DC | PRN

## 2017-02-16 NOTE — Progress Notes (Signed)
SLEEP MEDICINE CLINIC   Provider:  Larey Seat, M D  Referring Provider: Marton Redwood, MD Primary Care Physician:  Marton Redwood, MD  Chief Complaint  Patient presents with  . New Patient (Initial Visit)    had cpap in the past but lost it    HPI:  Kim Chavez is a 71 y.o. female , seen here as a referral/ revisit  from Dr. Janalyn Rouse for a sleep evaluation,    [name] had been seen in our practice 6 years ago she was at the time referred by her primary care physician Dr. Brigitte Pulse, with a medical history of breast cancer and double mastectomy, Crohn's disease, hypothyroidism, GERD, hypertension and suspected fibromyalgia. She reports again  frequently waking up from snoring or choking in sleep,  woke up with morning headaches, has trouble to initiate sleep and sustain sleep and was witnessed to snore loudly. Also suffers from migraine headaches. Her husband Delfino Lovett is also a patient in our practice. Her sleep study with [place] was performed on 09/06/2011 and the patient was diagnosed with low sleep efficiency, indicating that insomnia was present even then. Her AHI was 22.4, placing her in the moderate category of sleep apnea. It was not REM sleep accentuated but strongly dependent on supine sleep position. When she slept on her back her AHI was 61 per hour. She experienced 173 minutes of desaturations with the nadir of 79% SPO2. She did not have periodic limb movements she was observed to snore rather loudly. This was also dependent on a sleep position. It was recommended that she returns for CPAP titration which took place on 09/26/2011, the patient was titrated to 7 cm water per her AHI was reduced to 1.0. Her sleep efficiency improved from 65% in the baseline study to 71%, but remained clearly evident that the patient had trouble going to sleep initially.  Sleep habits are as follows: The patient usually goes to bed around 10:30 PM, but may not be asleep promptly.  Sometimes it may take 2-3 hours to go to sleep. It has worsened over the last decade. The bedroom is cool, quiet and dark, sleeps on her right side, sleeps on one pillow only.  She will go up twice at night to use the bathroom, reportedly suffers from a overactive bladder. Some nights she may be able to go back to sleep- and other nights not. She doesn't dream frequently, but when she does the dreams are "bad' and tend to wake her.  Since she is mostly retired, she often sleeps in until 9 or 10 AM , but estimates only 6 hours of nocturnal sleep. She feels tired, not restored or refreshed when she wakes up and frequently wakes up with morning headaches.   Social history:  Mrs. Lempke is married to [employer], a Development worker, international aid, the couple had 8 children of which 7 survived. No caffeine use, no alcohol use, no tobacco use. 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  Review of Systems: Out of a complete 14 system review, the patient complains of only the following symptoms, and all other reviewed systems are negative.  The patient endorsed snoring, insomnia, fragmented sleep.  Epworth score 10 , Fatigue severity score n/a   , depression score 2/15    Social History   Social History  . Marital status: Married    Spouse name: N/A  . Number of children: N/A  . Years of education: N/A   Occupational History  .  Not on file.   Social History Main Topics  . Smoking status: Former Smoker    Quit date: 09/12/1975  . Smokeless tobacco: Never Used  . Alcohol use No  . Drug use: No  . Sexual activity: Not on file   Other Topics Concern  . Not on file   Social History Narrative  . No narrative on file    Family History  Problem Relation Age of Onset  . Breast cancer Mother   . CAD Father     Past Medical History:  Diagnosis Date  . Anxiety   . Breast cancer ([place]) 2011   BILATERAL BREAST REMOVED  . Depression   . Fibromyalgia   . GERD (gastroesophageal reflux  disease)   . Hypertension   . Hypothyroidism   . Migraine    MIGRAINES  . Overactive bladder   . Pneumonia    several times  . RUQ pain   . Sleep apnea    uses c-pap machine  . Thyroid disease     Past Surgical History:  Procedure Laterality Date  . ANTERIOR AND POSTERIOR REPAIR N/A 01/05/2017   Procedure: ANTERIOR (CYSTOCELE) AND POSTERIOR REPAIR (RECTOCELE);  Surgeon: Paula Compton, MD;  Location: [facility];  Service: Gynecology;  Laterality: N/A;  . APPENDECTOMY    . CHOLECYSTECTOMY N/A 09/16/2015   Procedure: CHOLECYSTECTOMY;  Surgeon: [name], MD;  Location: [facility];  Service: General;  Laterality: N/A;  . deviated septum surgery in 1980's    . LAPAROSCOPIC LIVER CYST UNROOFING N/A 09/16/2015   Procedure: LAPAROSCOPIC LIVER CYST UNROOFING X'S 2;  Surgeon: [name], MD;  Location: [facility];  Service: General;  Laterality: N/A;  . LAPAROSCOPIC VAGINAL HYSTERECTOMY WITH SALPINGO OOPHORECTOMY Bilateral 01/05/2017   Procedure: LAPAROSCOPIC ASSISTED VAGINAL HYSTERECTOMY WITH SALPINGO OOPHORECTOMY;  Surgeon: Paula Compton, MD;  Location: [facility];  Service: Gynecology;  Laterality: Bilateral;  . SIMPLE MASTECTOMY W/ SENTINEL NODE BIOPSY Bilateral 2011  . TUBAL LIGATION      Current Outpatient Prescriptions  Medication Sig Dispense Refill  . amitriptyline (ELAVIL) 75 MG tablet Take 75 mg by mouth at bedtime.    . cetirizine (ZYRTEC) 10 MG tablet Take 10 mg by mouth daily as needed for allergies.     . Coenzyme Q10 (CO Q-10) 100 MG CAPS Take 1 tablet by mouth every morning.     . diazepam (VALIUM) 5 MG tablet Take 5 mg by mouth every 6 (six) hours as needed for anxiety.    Marland Kitchen escitalopram (LEXAPRO) 10 MG tablet Take 10 mg by mouth every morning.     . fluticasone (FLONASE) 50 MCG/ACT nasal spray Place 2 sprays into both nostrils daily as needed for allergies.     Marland Kitchen levothyroxine (SYNTHROID, LEVOTHROID) 50 MCG tablet Take 50 mcg by mouth daily before breakfast.    . mesalamine  (PENTASA) 500 MG CR capsule Take 500 mg by mouth 4 (four) times daily.     . Multiple Vitamin (MULTIVITAMIN WITH MINERALS) TABS tablet Take 1 tablet by mouth every morning.     Marland Kitchen omeprazole (PRILOSEC) 20 MG capsule Take 20 mg by mouth 2 (two) times daily before a meal.    . oxyCODONE-acetaminophen (PERCOCET/ROXICET) 5-325 MG tablet Take 1-2 tablets by mouth every 4 (four) hours as needed (moderate to severe pain (when tolerating fluids)). 30 tablet 0  . Probiotic Product (PROBIOTIC DAILY PO) Take 1 tablet by mouth every morning.     . simvastatin (ZOCOR) 20 MG tablet Take 20 mg by  mouth at bedtime.      No current facility-administered medications for this visit.     Allergies as of 02/16/2017 - Review Complete 02/16/2017  Allergen Reaction Noted  . Erythromycin Nausea And Vomiting 05/01/2015  . Tape Rash 05/01/2015  . Codeine Nausea And Vomiting 03/09/2014  . Ibuprofen Other (See Comments) 09/12/2015  . Neosporin [neomycin-bacitracin zn-polymyx] Itching and Other (See Comments) 03/09/2014  . Penicillins Swelling 03/09/2014    Vitals: BP (!) 144/86   Pulse (!) 106   Resp 20   Ht 5' 3"  (1.6 m)   Wt 157 lb (71.2 kg)   BMI 27.81 kg/m  Last Weight:  Wt Readings from Last 1 Encounters:  02/16/17 157 lb (71.2 kg)   OJJ:KKXF mass index is 27.81 kg/m.     Last Height:   Ht Readings from Last 1 Encounters:  02/16/17 5' 3"  (1.6 m)    Physical exam:  General: The patient is awake, alert and appears not in acute distress. The patient is well groomed. Head: Normocephalic, atraumatic. Neck is supple. Mallampati 3   neck circumference:14. Nasal airflow patent , but has taken allergy medication . Retrognathia is seen.  Cardiovascular:  Regular rate and rhythm  without  murmurs or carotid bruit, and without distended neck veins. Respiratory: Lungs are clear to auscultation. Skin:  Without evidence of edema, or rash Trunk: BMI is within normal limits for age . The patient's posture is  erect.   Neurologic exam : The patient is awake and alert, oriented to place and time.   Attention span & concentration ability appears normal.  Speech is fluent,  without dysarthria, dysphonia or aphasia.  Mood and affect are appropriate.  Cranial nerves: Reports decreased smell sensitivity, but preserved taste. Pupils are equal reactive to light. She has developing cataracts , complicating the funduscopic exam . Extraocular movements  in vertical and horizontal planes intact and without nystagmus. Visual fields by finger perimetry are intact. Hearing to finger rub intact. Facial sensation intact to fine touch. Facial motor strength is symmetric and tongue and uvula move midline. Shoulder shrug was symmetrical.   Motor exam: Normal tone, muscle bulk and symmetric strength in all extremities. Mrs. Birch has developed knee pain and mild swelling in the right knee, this has been present for several years now. Her strength has not changed but her range of motion is limited by pain. She has trouble maneuvering staircases up and down .  Sensory:  Fine touch, pinprick and vibration were tested in all extremities. Proprioceptionwas normal.  Coordination: Rapid alternating movements in the fingers/hands was normal. Finger-to-nose maneuver  normal without evidence of ataxia, dysmetria or tremor.  Gait and station: Patient walks without assistive device and is able unassisted to climb up to the exam table.  She needs to brace herself to rise from a seated position Strength within normal limits.  Stance is stable and normal. Tandem gait deferred.Turns with 4  Steps. Romberg testing is deferred .  Deep tendon reflexes: in the  upper and lower extremities are symmetric and intact. Babinski maneuver response is  downgoing.  The patient was advised of the nature of the diagnosed sleep disorder , the treatment options and risks for general a health and wellness arising from not treating the condition.  I  spent more than 40 minutes of face to face time with the patient. Greater than 50% of time was spent in counseling and coordination of care. We have discussed the diagnosis and differential and I answered the  patient's questions.     Assessment:  After physical and neurologic examination, review of laboratory studies,  Personal review of imaging studies, reports of other /same  Imaging studies ,  Results of polysomnography/ neurophysiology testing and pre-existing records as far as provided in visit., my assessment is   1) OSA/ untreated : Mrs. Wentling has had a stable weight and comparison to her study from 2012 she gained only about 8 pounds in comparison. The patient reports that she had gained in the interval time but also was successful in losing some weight again. She has been able to control her blood pressure, she is trying to implement an exercise regimen prefers to walk but this has been hampered by her knee pain. Her main sleep problem is not knee pain but insomnia that seems to be primary. She does have trouble initiating sleep. When she wakes up from sleep she has trouble going back to sleep. She is known to snore and she has had apneas in the past, these have been ongoingly witnessed.   2) Insomnia.  3) headaches- She wakes up with a dry mouth which is also typical for snorer and frequently with morning headaches which is more indicative of untreated sleep apnea, hypercapnia or hypoxemia. For this reason I would like the patient to undergo an attended split-night polysomnography with the intention to see that current degree of apnea, and to measure associated low oxygen or high CO2 levels. Her headaches are usually retro orbital , mostly left, throbbing and associated with photophobia and nausea. Movement aggravated her headaches.   4) status post breast cancer. Chronic opioid pain therapy and benzodiazepine therapy for anxiety , following the diagnosis. Needs to check for possible  respiratory side effects, central apnea, Co2 retention.     Plan:  Treatment plan and additional workup : SPLIT night polysomnography.  CO2 needed for morning headaches.      Asencion Partridge Manasvini Whatley MD  02/16/2017   CC: Marton Redwood, [address] [place], [place] [address]

## 2017-02-22 ENCOUNTER — Ambulatory Visit (INDEPENDENT_AMBULATORY_CARE_PROVIDER_SITE_OTHER): Payer: Medicare Other | Admitting: Neurology

## 2017-02-22 DIAGNOSIS — G4733 Obstructive sleep apnea (adult) (pediatric): Secondary | ICD-10-CM | POA: Diagnosis not present

## 2017-02-22 DIAGNOSIS — R519 Headache, unspecified: Secondary | ICD-10-CM

## 2017-02-22 DIAGNOSIS — R51 Headache: Secondary | ICD-10-CM

## 2017-02-22 DIAGNOSIS — G43011 Migraine without aura, intractable, with status migrainosus: Secondary | ICD-10-CM

## 2017-02-22 DIAGNOSIS — G44019 Episodic cluster headache, not intractable: Secondary | ICD-10-CM

## 2017-02-22 DIAGNOSIS — Z9013 Acquired absence of bilateral breasts and nipples: Secondary | ICD-10-CM

## 2017-02-22 DIAGNOSIS — G4736 Sleep related hypoventilation in conditions classified elsewhere: Secondary | ICD-10-CM

## 2017-03-02 NOTE — Procedures (Signed)
PATIENT'S NAME:  Kim Chavez, Kim Chavez DOB:      08-23-1946      MR#:    [id number]     DATE OF RECORDING: 02/22/2017 REFERRING M.D.:  Marton Redwood, MD Study Performed:   Baseline Polysomnogram HISTORY:  Kim Chavez is a 71 year old Female patient who was last seen in our practice 6 years ago, with a medical history of breast cancer / double mastectomy, Crohn's disease, hypothyroidism, GERD, hypertension and suspected fibromyalgia. She reports again  frequently waking up from snoring or choking in sleep,  waking  up with headaches, having  trouble to initiate sleep and sustain sleep and  with  witnessed snoring and apnea.  She also suffers from migraine headaches. The patient endorsed the Epworth Sleepiness Scale at 10/24 points.   The patient's weight 157 pounds with a height of 63 (inches), resulting in a BMI of 27.7 kg/m2. The patient's neck circumference measured 15 inches.  CURRENT MEDICATIONS: Elavil, Zyrtec, Co Q 10, Valium, Lexapro, Flonase , Synthroid, Prilosec; Percocet; Zocor   PROCEDURE:  This is a multichannel digital polysomnogram utilizing the Somnostar 11.2 system.  Electrodes and sensors were applied and monitored per AASM Specifications.   EEG, EOG, Chin and Limb EMG, were sampled at 200 Hz.  ECG, Snore and Nasal Pressure, Thermal Airflow, Respiratory Effort, CPAP Flow and Pressure, Oximetry was sampled at 50 Hz. Digital video and audio were recorded.      BASELINE STUDY :  Lights Out was at 22:26 and Lights On at 05:07.  Total recording time (TRT) was 402 minutes, with a total sleep time (TST) of 302 minutes.   The patient's sleep latency was 93.5 minutes.  REM latency was 108.5 minutes.  The sleep efficiency was 75.1 %.     SLEEP ARCHITECTURE: WASO (Wake after sleep onset) was 6.5 minutes.  There were 4 minutes in Stage N1, 191 minutes Stage N2, 83.5 minutes Stage N3 and 23.5 minutes in Stage REM.  The percentage of Stage N1 was 1.3%, Stage N2 was 63.2%, Stage N3 was 27.6% and  Stage R (REM sleep) was 7.8%.   RESPIRATORY ANALYSIS:  There were a total of 25 respiratory events:  13 obstructive apneas, 0 central apneas and 12 hypopneas with 0 respiratory event related arousals (RERAs).     The total APNEA/HYPOPNEA INDEX (AHI) was 5.0/hr. and the total RESPIRATORY DISTURBANCE INDEX was 5.0 /hr.  0 events occurred in REM sleep and 24 events in NREM. The REM AHI was 0 /hour, versus a non-REM AHI of 5.4. The patient spent 57.5 minutes of total sleep time in the supine position and 245 minutes in non-supine. The supine AHI was 25.0 versus a non-supine AHI of 0.2.  OXYGEN SATURATION & C02:  The Wake baseline 02 saturation was 92%, with the lowest being 76%. Time spent below 89% saturation equaled 175 minutes. Average End Tidal CO2 during sleep was 41.8 torr.  During REM, the average End Tidal CO2 was 64.4 torr.  During NREM, the average End Tidal CO2 was 39.9 torr.  Total sleep time greater than 40 torr was 0.60 minutes.   PERIODIC LIMB MOVEMENTS:   The patient had a total of 7 Periodic Limb Movements.  The arousals were noted as: 58 were spontaneous, 0 were associated with PLMs, and 21 were associated with respiratory events. Audio and video analysis did not show any abnormal or unusual movements, behaviors, phonations or vocalizations.   The patient took no bathroom breaks during the study. Moderate Snoring  was noted. EKG was in keeping with normal sinus rhythm (NSR).  IMPRESSION:  Sleep induced hypoxemia and hypercapnia, which may contribute to headaches and fatigue.   Significant Sleep Apnea was not present. Supine position exacerbated her apnea to an AHI of 25.90/hr.    RECOMMENDATIONS:  Avoid supine sleep. Tennis ball method to be discussed, this reduces position dependent AHI and snoring. I will refer further to pulmonology as hypoxemia and hypercapnia need to be evaluated and possibly treated with other means ( CPAP is not indicated for this mild apnea)     I  certify that I have reviewed the entire raw data recording prior to the issuance of this report in accordance with the Standards of Accreditation of the [place] [employer] (AASM)      Larey Seat, MD  03-02-2017  Diplomat, American Board of Psychiatry and Neurology  Diplomat, American Board of [place] Director, [employer] [facility] at [facility]

## 2017-03-02 NOTE — Addendum Note (Signed)
Addended by: Larey Seat on: 03/02/2017 12:05 PM   Modules accepted: Orders

## 2017-03-03 ENCOUNTER — Telehealth: Payer: Self-pay

## 2017-03-03 NOTE — Telephone Encounter (Signed)
Pt called back for Kim Chavez, says she has a few more questions and would like a call back

## 2017-03-03 NOTE — Telephone Encounter (Signed)
I called pt. I advised her that her sleep study results revealed sleep induced hypoxemia and hypercapnia which may contribute to headaches and fatigue but significant sleep apnea was not present. However, the supine position exacerbated her apnea. I advised pt that Dr. Brett Fairy recommended avoiding supine sleep and I discussed the tennis ball method with the pt, which will reduce AHI and snoring. I advised her that Dr. Brett Fairy recommends a referral to pulmonology to discuss the hypoxemia and hypercapnia since cpap is not indicated for her mild sleep apnea. I offered pt a follow up appt to discuss these results further with Dr. Brett Fairy but she declined. She asked me to send a copy of these results to Dr. Brigitte Pulse. Pt is agreeable to seeing a pulmonologist to discuss the hypoxemia and hypercapnia and knows that our office will set that referral up. Pt verbalized understanding of results. Pt had no questions at this time but was encouraged to call back if questions arise.

## 2017-03-03 NOTE — Telephone Encounter (Signed)
I called pt back. She says that her husband thinks the pt needs an appt. An appt was made for 03/29/17 at 10:00am. Pt verbalized understanding of new appt date and time.

## 2017-03-03 NOTE — Telephone Encounter (Signed)
-----   Message from Larey Seat, MD sent at 03/02/2017 12:05 PM EDT ----- IMPRESSION:  Sleep induced hypoxemia and hypercapnia, which may contribute to headaches and fatigue.   Significant Sleep Apnea was not present. Supine position exacerbated her apnea to an AHI of 25.90/hr.    RECOMMENDATIONS:  Avoid supine sleep. Tennis ball method to be discussed, this reduces position dependent AHI and snoring. I will refer further to pulmonology as hypoxemia and hypercapnia need to be evaluated and possibly treated with other means ( CPAP is not indicated for this mild apnea)     Larey Seat, MD  03-02-2017

## 2017-03-15 DIAGNOSIS — H21561 Pupillary abnormality, right eye: Secondary | ICD-10-CM | POA: Diagnosis not present

## 2017-03-15 DIAGNOSIS — H25812 Combined forms of age-related cataract, left eye: Secondary | ICD-10-CM | POA: Diagnosis not present

## 2017-03-15 DIAGNOSIS — H268 Other specified cataract: Secondary | ICD-10-CM | POA: Diagnosis not present

## 2017-03-29 ENCOUNTER — Ambulatory Visit: Payer: Self-pay | Admitting: Neurology

## 2017-03-30 ENCOUNTER — Telehealth: Payer: Self-pay

## 2017-03-30 NOTE — Telephone Encounter (Signed)
LM for patient to call back and r/s her appt due to bad weather.

## 2017-04-19 DIAGNOSIS — H21561 Pupillary abnormality, right eye: Secondary | ICD-10-CM | POA: Diagnosis not present

## 2017-04-19 DIAGNOSIS — H25811 Combined forms of age-related cataract, right eye: Secondary | ICD-10-CM | POA: Diagnosis not present

## 2017-04-19 DIAGNOSIS — H268 Other specified cataract: Secondary | ICD-10-CM | POA: Diagnosis not present

## 2017-08-11 DIAGNOSIS — I10 Essential (primary) hypertension: Secondary | ICD-10-CM | POA: Diagnosis not present

## 2017-08-11 DIAGNOSIS — R7301 Impaired fasting glucose: Secondary | ICD-10-CM | POA: Diagnosis not present

## 2017-08-11 DIAGNOSIS — E038 Other specified hypothyroidism: Secondary | ICD-10-CM | POA: Diagnosis not present

## 2017-08-11 DIAGNOSIS — E784 Other hyperlipidemia: Secondary | ICD-10-CM | POA: Diagnosis not present

## 2017-08-11 DIAGNOSIS — M859 Disorder of bone density and structure, unspecified: Secondary | ICD-10-CM | POA: Diagnosis not present

## 2017-08-18 DIAGNOSIS — M797 Fibromyalgia: Secondary | ICD-10-CM | POA: Diagnosis not present

## 2017-08-18 DIAGNOSIS — I10 Essential (primary) hypertension: Secondary | ICD-10-CM | POA: Diagnosis not present

## 2017-08-18 DIAGNOSIS — Z6828 Body mass index (BMI) 28.0-28.9, adult: Secondary | ICD-10-CM | POA: Diagnosis not present

## 2017-08-18 DIAGNOSIS — Z23 Encounter for immunization: Secondary | ICD-10-CM | POA: Diagnosis not present

## 2017-08-18 DIAGNOSIS — M8580 Other specified disorders of bone density and structure, unspecified site: Secondary | ICD-10-CM | POA: Diagnosis not present

## 2017-08-18 DIAGNOSIS — E038 Other specified hypothyroidism: Secondary | ICD-10-CM | POA: Diagnosis not present

## 2017-08-18 DIAGNOSIS — R8299 Other abnormal findings in urine: Secondary | ICD-10-CM | POA: Diagnosis not present

## 2017-08-18 DIAGNOSIS — K5090 Crohn's disease, unspecified, without complications: Secondary | ICD-10-CM | POA: Diagnosis not present

## 2017-08-18 DIAGNOSIS — Z1389 Encounter for screening for other disorder: Secondary | ICD-10-CM | POA: Diagnosis not present

## 2017-08-18 DIAGNOSIS — Z0000 Encounter for general adult medical examination without abnormal findings: Secondary | ICD-10-CM | POA: Diagnosis not present

## 2017-08-18 DIAGNOSIS — R7301 Impaired fasting glucose: Secondary | ICD-10-CM | POA: Diagnosis not present

## 2017-08-18 DIAGNOSIS — E784 Other hyperlipidemia: Secondary | ICD-10-CM | POA: Diagnosis not present

## 2017-08-18 DIAGNOSIS — G43909 Migraine, unspecified, not intractable, without status migrainosus: Secondary | ICD-10-CM | POA: Diagnosis not present

## 2017-08-18 DIAGNOSIS — N390 Urinary tract infection, site not specified: Secondary | ICD-10-CM | POA: Diagnosis not present

## 2017-08-30 DIAGNOSIS — D2271 Melanocytic nevi of right lower limb, including hip: Secondary | ICD-10-CM | POA: Diagnosis not present

## 2017-08-30 DIAGNOSIS — L738 Other specified follicular disorders: Secondary | ICD-10-CM | POA: Diagnosis not present

## 2017-08-30 DIAGNOSIS — D2262 Melanocytic nevi of left upper limb, including shoulder: Secondary | ICD-10-CM | POA: Diagnosis not present

## 2017-08-30 DIAGNOSIS — D2239 Melanocytic nevi of other parts of face: Secondary | ICD-10-CM | POA: Diagnosis not present

## 2017-08-30 DIAGNOSIS — L720 Epidermal cyst: Secondary | ICD-10-CM | POA: Diagnosis not present

## 2017-08-30 DIAGNOSIS — D692 Other nonthrombocytopenic purpura: Secondary | ICD-10-CM | POA: Diagnosis not present

## 2017-08-30 DIAGNOSIS — D225 Melanocytic nevi of trunk: Secondary | ICD-10-CM | POA: Diagnosis not present

## 2017-08-30 DIAGNOSIS — L821 Other seborrheic keratosis: Secondary | ICD-10-CM | POA: Diagnosis not present

## 2017-08-30 DIAGNOSIS — L814 Other melanin hyperpigmentation: Secondary | ICD-10-CM | POA: Diagnosis not present

## 2017-08-30 DIAGNOSIS — D2272 Melanocytic nevi of left lower limb, including hip: Secondary | ICD-10-CM | POA: Diagnosis not present

## 2017-08-30 DIAGNOSIS — Z85828 Personal history of other malignant neoplasm of skin: Secondary | ICD-10-CM | POA: Diagnosis not present

## 2017-08-30 DIAGNOSIS — D1801 Hemangioma of skin and subcutaneous tissue: Secondary | ICD-10-CM | POA: Diagnosis not present

## 2017-10-25 ENCOUNTER — Ambulatory Visit (INDEPENDENT_AMBULATORY_CARE_PROVIDER_SITE_OTHER): Payer: Medicare Other | Admitting: Pulmonary Disease

## 2017-10-25 ENCOUNTER — Encounter: Payer: Self-pay | Admitting: Pulmonary Disease

## 2017-10-25 ENCOUNTER — Ambulatory Visit (INDEPENDENT_AMBULATORY_CARE_PROVIDER_SITE_OTHER)
Admission: RE | Admit: 2017-10-25 | Discharge: 2017-10-25 | Disposition: A | Discharge status: Home / Self Care | Payer: Medicare Other | Source: Ambulatory Visit | Attending: Pulmonary Disease | Admitting: Pulmonary Disease

## 2017-10-25 VITALS — BP 116/74 | HR 69 | Ht 63.0 in | Wt 147.0 lb

## 2017-10-25 DIAGNOSIS — G4733 Obstructive sleep apnea (adult) (pediatric): Secondary | ICD-10-CM

## 2017-10-25 DIAGNOSIS — G4736 Sleep related hypoventilation in conditions classified elsewhere: Secondary | ICD-10-CM | POA: Diagnosis not present

## 2017-10-25 DIAGNOSIS — J986 Disorders of diaphragm: Secondary | ICD-10-CM | POA: Diagnosis not present

## 2017-10-25 DIAGNOSIS — J9811 Atelectasis: Secondary | ICD-10-CM | POA: Diagnosis not present

## 2017-10-25 NOTE — Assessment & Plan Note (Addendum)
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 Schedule sniff test to check on diaphragm Regardless CPAP would help her with this problem

## 2017-10-25 NOTE — Assessment & Plan Note (Addendum)
very mild obstructive sleep apnea especially when you sleep On your back.  Drop in oxygen levels during sleep is more significant. This may be related to a paralyzed right diaphragm from your liver surgery  She feels that prior study was not typical since she used Valium at night Schedule home sleep study. Based on this, we will try and obtain a CPAP machine for you

## 2017-10-25 NOTE — Patient Instructions (Signed)
You have very mild obstructive sleep apnea especially when you sleep On your back.  Drop in oxygen levels during sleep is more significant. This may be related to a paralyzed right diaphragm from your liver surgery   Obtain chest x-ray today. Schedule sniff test to check on diaphragm  Schedule home sleep study. Based on this, we will try and obtain a CPAP machine for you

## 2017-10-25 NOTE — Progress Notes (Signed)
Subjective:    Patient ID: Kim Chavez, female    DOB: [birth year], 71 y.o.   MRN: [id number]  HPI  Chief Complaint  Patient presents with  . Sleep Consult    Per patient, she wakes up with headaches in the morning. Pain is usually on the left side and consists of the entire head. Per patient, this has been going on for many years.    71 year old remote smoker presents for evaluation of sleep disordered breathing, referred by neurology.  She sees neurology for chronic migraine headaches and is maintained on propranolol. PSG 02/2017 showed AHI of 5/hour with total sleep time of 5 hours, she slept supine for 1 hour with AHI of 25/hour.  Lowest desaturation was 76%, she spent 195 minutes with saturation less than 88%.  It was felt that she had 'Sleep induced hypoxemia and hypercapnia, which may contribute to headaches and fatigue. 'And hence she was referred to [place] for pulmonary causes. Prior to this she had been maintained on CPAP for over 5 years for a previous diagnosis of obstructive sleep apnea.  PSG in 08/2011 apparently showed AHI of 22/hour, supine AHI was 61/hour.  CPAP was titrated to 7 cm  Of note, she underwent surgery in 2016 for large liver cysts.  I have reviewed CT scan from 04/2015 and chest x-ray from 07/2015 which showed elevated right hemidiaphragm. She smoked less than 5 pack years before she quit in 1976, her husband states that the belonged to the charge of [place].   Husband has noted moderate snoring, he himself uses a CPAP machine and feels that CPAP has really helped her improve her daytime somnolence and fatigue.  Epworth sleepiness score is 9. Bedtime is around 10:30 PM, sleep latency is variable can be up to 1-2 hours, sleeps on one pillow on her side, reports nocturia due to overactive bladder, is out of bed latest by 9 AM feeling tired with occasional dryness of mouth and headaches  Past Medical History:  Diagnosis Date  . Anxiety   . Breast cancer  ([place]) 2011   BILATERAL BREAST REMOVED  . Depression   . Fibromyalgia   . GERD (gastroesophageal reflux disease)   . Hypertension   . Hypothyroidism   . Migraine    MIGRAINES  . Overactive bladder   . Pneumonia    several times  . RUQ pain   . Sleep apnea    uses c-pap machine  . Thyroid disease     Past Surgical History:  Procedure Laterality Date  . APPENDECTOMY    . deviated septum surgery in 1980's    . SIMPLE MASTECTOMY W/ SENTINEL NODE BIOPSY Bilateral 2011  . TUBAL LIGATION      Allergies  Allergen Reactions  . Erythromycin Nausea And Vomiting  . Tape Rash  . Codeine Nausea And Vomiting    "deathly sick on stomach"   . Ibuprofen Other (See Comments)    Has Crohn's and was instructed not to take.   . Neosporin [Neomycin-Bacitracin Zn-Polymyx] Itching and Other (See Comments)    Turns red around the site and itches like crazy for about two weeks.   Marland Kitchen Penicillins Swelling    Has patient had a PCN reaction causing immediate rash, facial/tongue/throat swelling, SOB or lightheadedness with hypotension: Yes- "swell up all over"  Has patient had a PCN reaction causing severe rash involving mucus membranes or skin necrosis: No Has patient had a PCN reaction that required hospitalization No Has patient had  a PCN reaction occurring within the last 10 years: No If all of the above answers are "NO", then may proceed with Cephalosporin use.     Social History   Socioeconomic History  . Marital status: Married    Spouse name: Not on file  . Number of children: Not on file  . Years of education: Not on file  . Highest education level: Not on file  Social Needs  . Financial resource strain: Not on file  . Food insecurity - worry: Not on file  . Food insecurity - inability: Not on file  . Transportation needs - medical: Not on file  . Transportation needs - non-medical: Not on file  Occupational History  . Not on file  Tobacco Use  . Smoking status: Former Smoker     Last attempt to quit: 09/12/1975    Years since quitting: 42.1  . Smokeless tobacco: Never Used  Substance and Sexual Activity  . Alcohol use: No  . Drug use: No  . Sexual activity: Not on file  Other Topics Concern  . Not on file  Social History Narrative  . Not on file    Family History  Problem Relation Age of Onset  . Breast cancer Mother   . CAD Father      Review of Systems Positive for nonproductive cough, chronic morning headaches, anxiety, depression, joint stiffness and itching in the ears  Constitutional: negative for anorexia, fevers and sweats  Eyes: negative for irritation, redness and visual disturbance  Ears, nose, mouth, throat, and face: negative for earaches, epistaxis, nasal congestion and sore throat  Respiratory: negative for cough, dyspnea on exertion, sputum and wheezing  Cardiovascular: negative for chest pain, dyspnea, lower extremity edema, orthopnea, palpitations and syncope  Gastrointestinal: negative for abdominal pain, constipation, diarrhea, melena, nausea and vomiting  Genitourinary:negative for dysuria, frequency and hematuria  Hematologic/lymphatic: negative for bleeding, easy bruising and lymphadenopathy  Musculoskeletal:negative for arthralgias, muscle weakness and stiff joints  Neurological: negative for coordination problems, gait problems, headaches and weakness  Endocrine: negative for diabetic symptoms including polydipsia, polyuria and weight loss     Objective:   Physical Exam  Gen. Pleasant, well-nourished, in no distress, normal affect ENT - no lesions, no post nasal drip Neck: No JVD, no thyromegaly, no carotid bruits Lungs: no use of accessory muscles, no dullness to percussion, clear without rales or rhonchi  Cardiovascular: Rhythm regular, heart sounds  normal, no murmurs or gallops, no peripheral edema Abdomen: soft and non-tender, no hepatosplenomegaly, BS normal. Musculoskeletal: No deformities, no cyanosis or  clubbing Neuro:  alert, non focal        Assessment & Plan:

## 2017-10-26 NOTE — Progress Notes (Signed)
I agree with the assessment and plan as directed by Pulmonology .The patient is known to me .   Vonita Calloway, MD

## 2017-11-02 ENCOUNTER — Ambulatory Visit (HOSPITAL_COMMUNITY)
Admission: RE | Admit: 2017-11-02 | Discharge: 2017-11-02 | Disposition: A | Discharge status: Home / Self Care | Payer: Medicare Other | Source: Ambulatory Visit | Attending: Pulmonary Disease | Admitting: Pulmonary Disease

## 2017-11-02 DIAGNOSIS — J986 Disorders of diaphragm: Secondary | ICD-10-CM

## 2017-11-02 DIAGNOSIS — R0600 Dyspnea, unspecified: Secondary | ICD-10-CM | POA: Diagnosis not present

## 2017-11-02 DIAGNOSIS — G4736 Sleep related hypoventilation in conditions classified elsewhere: Secondary | ICD-10-CM | POA: Diagnosis not present

## 2017-11-17 ENCOUNTER — Telehealth: Payer: Self-pay | Admitting: Pulmonary Disease

## 2017-11-17 NOTE — Telephone Encounter (Signed)
I have called and spoken with Kim Chavez. She is out of town and is not sure when she will be returning. She would like to get the HST done before the end of the year. I gave her my direct number and ask that when she got back into town to call me so we could reschedule her picking up the HST machine.

## 2017-12-02 ENCOUNTER — Emergency Department (HOSPITAL_COMMUNITY)
Admission: EM | Admit: 2017-12-02 | Discharge: 2017-12-02 | Disposition: A | Discharge status: Home / Self Care | Payer: Medicare Other | Attending: Emergency Medicine | Admitting: Emergency Medicine

## 2017-12-02 ENCOUNTER — Encounter (HOSPITAL_COMMUNITY): Payer: Self-pay

## 2017-12-02 ENCOUNTER — Other Ambulatory Visit: Payer: Self-pay

## 2017-12-02 ENCOUNTER — Emergency Department (HOSPITAL_COMMUNITY): Payer: Medicare Other

## 2017-12-02 DIAGNOSIS — Z79899 Other long term (current) drug therapy: Secondary | ICD-10-CM | POA: Insufficient documentation

## 2017-12-02 DIAGNOSIS — E039 Hypothyroidism, unspecified: Secondary | ICD-10-CM | POA: Insufficient documentation

## 2017-12-02 DIAGNOSIS — E785 Hyperlipidemia, unspecified: Secondary | ICD-10-CM | POA: Insufficient documentation

## 2017-12-02 DIAGNOSIS — R519 Headache, unspecified: Secondary | ICD-10-CM

## 2017-12-02 DIAGNOSIS — I10 Essential (primary) hypertension: Secondary | ICD-10-CM | POA: Diagnosis not present

## 2017-12-02 DIAGNOSIS — R9431 Abnormal electrocardiogram [ECG] [EKG]: Secondary | ICD-10-CM

## 2017-12-02 DIAGNOSIS — K5090 Crohn's disease, unspecified, without complications: Secondary | ICD-10-CM | POA: Diagnosis not present

## 2017-12-02 DIAGNOSIS — I4581 Long QT syndrome: Secondary | ICD-10-CM | POA: Diagnosis not present

## 2017-12-02 DIAGNOSIS — I6523 Occlusion and stenosis of bilateral carotid arteries: Secondary | ICD-10-CM | POA: Diagnosis not present

## 2017-12-02 DIAGNOSIS — R42 Dizziness and giddiness: Secondary | ICD-10-CM | POA: Insufficient documentation

## 2017-12-02 DIAGNOSIS — Z87891 Personal history of nicotine dependence: Secondary | ICD-10-CM | POA: Insufficient documentation

## 2017-12-02 DIAGNOSIS — R51 Headache: Secondary | ICD-10-CM | POA: Diagnosis not present

## 2017-12-02 LAB — BASIC METABOLIC PANEL
Anion gap: 9 (ref 5–15)
BUN: 11 mg/dL (ref 6–20)
CALCIUM: 9.3 mg/dL (ref 8.9–10.3)
CO2: 27 mmol/L (ref 22–32)
CREATININE: 1.08 mg/dL — AB (ref 0.44–1.00)
Chloride: 101 mmol/L (ref 101–111)
GFR calc non Af Amer: 50 mL/min — ABNORMAL LOW (ref >60)
GFR, EST AFRICAN AMERICAN: 58 mL/min — AB (ref >60)
Glucose, Bld: 103 mg/dL — ABNORMAL HIGH (ref 65–99)
Potassium: 3.3 mmol/L — ABNORMAL LOW (ref 3.5–5.1)
SODIUM: 137 mmol/L (ref 135–145)

## 2017-12-02 LAB — CBC
HCT: 40.9 % (ref 36.0–46.0)
Hemoglobin: 13.6 g/dL (ref 12.0–15.0)
MCH: 27.1 pg (ref 26.0–34.0)
MCHC: 33.3 g/dL (ref 30.0–36.0)
MCV: 81.5 fL (ref 78.0–100.0)
PLATELETS: 281 10*3/uL (ref 150–400)
RBC: 5.02 MIL/uL (ref 3.87–5.11)
RDW: 14.3 % (ref 11.5–15.5)
WBC: 9.2 10*3/uL (ref 4.0–10.5)

## 2017-12-02 LAB — URINALYSIS, ROUTINE W REFLEX MICROSCOPIC
BILIRUBIN URINE: NEGATIVE
GLUCOSE, UA: NEGATIVE mg/dL
Hgb urine dipstick: NEGATIVE
KETONES UR: NEGATIVE mg/dL
NITRITE: NEGATIVE
PH: 5 (ref 5.0–8.0)
Protein, ur: NEGATIVE mg/dL
Specific Gravity, Urine: 1.008 (ref 1.005–1.030)

## 2017-12-02 LAB — MAGNESIUM: MAGNESIUM: 1.9 mg/dL (ref 1.7–2.4)

## 2017-12-02 LAB — C-REACTIVE PROTEIN: CRP: <0.8 mg/dL (ref <1.0)

## 2017-12-02 LAB — SEDIMENTATION RATE: Sed Rate: 14 mm/hr (ref 0–22)

## 2017-12-02 MED ORDER — HYDROCODONE-ACETAMINOPHEN 5-325 MG PO TABS: 1.0000 | ORAL_TABLET | Freq: Once | ORAL | Status: DC

## 2017-12-02 MED ORDER — IOPAMIDOL (ISOVUE-370) INJECTION 76%
INTRAVENOUS | Status: AC
  Administered 2017-12-02: 50 mL
  Filled 2017-12-02: qty 50

## 2017-12-02 MED ORDER — MAGNESIUM SULFATE IN D5W 1-5 GM/100ML-% IV SOLN
1.0000 g | Freq: Once | INTRAVENOUS | Status: AC
  Administered 2017-12-02: 1 g via INTRAVENOUS
  Filled 2017-12-02: qty 100

## 2017-12-02 MED ORDER — POTASSIUM CHLORIDE CRYS ER 20 MEQ PO TBCR
40.0000 meq | EXTENDED_RELEASE_TABLET | Freq: Once | ORAL | Status: AC
  Administered 2017-12-02: 40 meq via ORAL
  Filled 2017-12-02: qty 2

## 2017-12-02 NOTE — ED Triage Notes (Signed)
Pt states she was driving and had a sudden onset of dizziness with severe headache. Pt states the dizziness has resolved but still has headache. Pt alert and oriented X4.

## 2017-12-02 NOTE — Discharge Instructions (Signed)
1. For your headache. Your head imaging is normal and your blood work (ESR/CRP) looking for inflammation is normal. You can take your as needed medications at home for your symptoms.  2. Your EKG has a prolonged QTc which we discussed. You need to follow up with your PCP next week for repeat EKG. Some of the medications that you are on can lead to prolonged QTc. Please discuss with your PCP regarding this. Your potassium level was slightly low. We gave you a dose of potassium. Your PCP should repeat electrolytes. Please return to the ED if you are having palpitations or fainting spells.

## 2017-12-02 NOTE — ED Notes (Signed)
Pt is aware she needs a urine sample

## 2017-12-02 NOTE — ED Provider Notes (Signed)
[facility] EMERGENCY DEPARTMENT Provider Note   CSN: [id number] Arrival date & time: 12/02/17  9892     History   Chief Complaint Chief Complaint  Patient presents with  . Headache  . Near Syncope    HPI Kim Chavez is a 71 y.o. female.  HPI 71yo female with PMH of HTN, Depression, HLD, Migraine, OSA who presents with sudden onset of dizziness and Headache. Reports that she was driving this evening and suddenly had an episode of dizziness. She did not feel lightheaded but felt like her surroundings were moving around her. She almost lost control of the car but was able to stop safely. The dizziness lasted about 5 minutes. She did not have associated chest pain, palpitations, nausea, diaphoresis, shortness of breath. Then after about 5 minutes, she have a severe diffuse headache that was worst at the top of her head. She describes it as a pressure. Denies blurred vision. She reports this is not the same as her migraine headaches as her migraines are usually unilateral with associated nausea and photophobia. She denies any recent head trauma or injury. No family history of brain aneurysms to her knowledge. She is not on blood thinners. She still has headache but not as severe.   Past Medical History:  Diagnosis Date  . Anxiety   . Breast cancer ([place]) 2011   BILATERAL BREAST REMOVED  . Depression   . Fibromyalgia   . GERD (gastroesophageal reflux disease)   . Hypertension   . Hypothyroidism   . Migraine    MIGRAINES  . Overactive bladder   . Pneumonia    several times  . RUQ pain   . Sleep apnea    uses c-pap machine  . Thyroid disease     Patient Active Problem List   Diagnosis Date Noted  . Episodic cluster headache, not intractable 02/16/2017  . Intractable migraine without aura and with status migrainosus 02/16/2017  . OSA (obstructive sleep apnea) 02/16/2017  . Comorbid sleep-related hypoventilation 02/16/2017  . Sleep related headaches  02/16/2017  . Constipation due to opioid therapy 02/16/2017  . Chronically on benzodiazepine therapy 02/16/2017  . Pelvic relaxation due to uterine prolapse 01/06/2017  . History of breast cancer 01/06/2017  . Status post laparoscopic assisted vaginal hysterectomy (LAVH) 01/05/2017  . Liver cyst 09/16/2015  . Hypothyroidism 05/01/2015  . Essential hypertension 05/01/2015  . Crohn's disease ([place]) 05/01/2015  . Hyperlipidemia 05/01/2015    Past Surgical History:  Procedure Laterality Date  . ANTERIOR AND POSTERIOR REPAIR N/A 01/05/2017   Procedure: ANTERIOR (CYSTOCELE) AND POSTERIOR REPAIR (RECTOCELE);  Surgeon: Paula Compton, MD;  Location: [facility];  Service: Gynecology;  Laterality: N/A;  . APPENDECTOMY    . CHOLECYSTECTOMY N/A 09/16/2015   Procedure: CHOLECYSTECTOMY;  Surgeon: [name], MD;  Location: [facility];  Service: General;  Laterality: N/A;  . deviated septum surgery in 1980's    . LAPAROSCOPIC LIVER CYST UNROOFING N/A 09/16/2015   Procedure: LAPAROSCOPIC LIVER CYST UNROOFING X'S 2;  Surgeon: [name], MD;  Location: [facility];  Service: General;  Laterality: N/A;  . LAPAROSCOPIC VAGINAL HYSTERECTOMY WITH SALPINGO OOPHORECTOMY Bilateral 01/05/2017   Procedure: LAPAROSCOPIC ASSISTED VAGINAL HYSTERECTOMY WITH SALPINGO OOPHORECTOMY;  Surgeon: Paula Compton, MD;  Location: [facility];  Service: Gynecology;  Laterality: Bilateral;  . SIMPLE MASTECTOMY W/ SENTINEL NODE BIOPSY Bilateral 2011  . TUBAL LIGATION      OB History    No data available       Home Medications  Prior to Admission medications   Medication Sig Start Date End Date Taking? Authorizing Provider  amitriptyline (ELAVIL) 75 MG tablet Take 75 mg by mouth at bedtime 02/06/15  Yes [provider]  calcium carbonate (TUMS EX) 750 MG chewable tablet Chew 1 tablet by mouth daily.   Yes [provider]  Cholecalciferol (VITAMIN D3) 5000 units CAPS Take 5,000 Units by mouth daily.   Yes [provider]  Coenzyme Q10 (CO Q-10) 100 MG CAPS Take 100 mg by mouth every morning.    Yes [provider]  diazepam (VALIUM) 5 MG tablet Take 1 tablet (5 mg total) by mouth every 6 (six) hours as needed for anxiety. 02/16/17  Yes Dohmeier, Asencion Partridge, MD  DULoxetine (CYMBALTA) 60 MG capsule Take 60 mg by mouth at bedtime.  08/18/17  Yes [provider]  escitalopram (LEXAPRO) 10 MG tablet Take 10 mg by mouth at bedtime.    Yes [provider]  HYDROcodone-acetaminophen (NORCO/VICODIN) 5-325 MG tablet Take 1 tablet by mouth every six hours as needed for severe headache pain 08/19/17  Yes [provider]  levothyroxine (SYNTHROID, LEVOTHROID) 50 MCG tablet Take 50 mcg by mouth daily before breakfast.   Yes [provider]  Multiple Vitamins-Minerals (MULTI ADULT GUMMIES) CHEW Chew 2 tablets by mouth daily.   Yes [provider]  NON FORMULARY CPAP: At bedtime (nightly)   Yes [provider]  omeprazole (PRILOSEC) 20 MG capsule Take 40 mg by mouth at bedtime.    Yes [provider]  Probiotic Product (PROBIOTIC DAILY PO) Take 2 tablets by mouth every morning.    Yes [provider]  propranolol ER (INDERAL LA) 80 MG 24 hr capsule Take 80 mg by mouth daily.  09/17/17  Yes [provider]  simvastatin (ZOCOR) 20 MG tablet Take 20 mg by mouth at bedtime.    Yes [provider]  temazepam (RESTORIL) 15 MG capsule Take 15 mg at bedtime as needed by mouth for sleep.   Yes [provider]  fluticasone (FLONASE) 50 MCG/ACT nasal spray Place 2 sprays into both nostrils daily as needed for allergies.     [provider]  oxyCODONE-acetaminophen (PERCOCET/ROXICET) 5-325 MG tablet Take 1-2 tablets by mouth every 4 (four) hours as needed (moderate to severe pain (when tolerating fluids)). Patient not taking: Reported on 12/02/2017 01/06/17   Paula Compton, MD    Family History Family History  Problem  Relation Age of Onset  . Breast cancer Mother   . CAD Father     Social History Social History   Tobacco Use  . Smoking status: Former Smoker    Last attempt to quit: 09/12/1975    Years since quitting: 42.2  . Smokeless tobacco: Never Used  Substance Use Topics  . Alcohol use: No  . Drug use: No     Allergies   Erythromycin; Tape; Aspirin; Codeine; Ibuprofen; Other; Penicillins; and Neosporin [neomycin-bacitracin zn-polymyx]   Review of Systems Review of Systems  Constitutional: Negative for chills and fever.  HENT: Negative for rhinorrhea and sore throat.   Respiratory: Negative for cough, choking, chest tightness and shortness of breath.   Cardiovascular: Negative for chest pain, palpitations and leg swelling.  Gastrointestinal: Negative for abdominal pain, diarrhea, nausea and vomiting.  Genitourinary: Negative for dysuria and frequency.  Neurological: Positive for dizziness and headaches. Negative for speech difficulty, weakness, light-headedness and numbness.     Physical Exam Updated Vital Signs BP (!) 153/81   Pulse  89   Temp 98.3 F (36.8 C) (Oral)   Resp 20   SpO2 95%   Physical Exam  Constitutional: She is oriented to person, place, and time. She appears well-developed and well-nourished.  Non-toxic appearance. She does not appear ill.  HENT:  Head: Normocephalic and atraumatic.  Mild tenderness to palpation of the right temple   Eyes: EOM are normal. Pupils are equal, round, and reactive to light.  Neck: Normal range of motion. Neck supple.  Cardiovascular: Normal rate, regular rhythm and normal heart sounds.  Pulmonary/Chest: Effort normal and breath sounds normal.  Abdominal: Soft. Bowel sounds are normal. There is no tenderness.  Musculoskeletal: She exhibits no tenderness.  Neurological: She is alert and oriented to person, place, and time. She has normal strength. No cranial nerve deficit or sensory deficit. Coordination normal.  Normal finger  to nose testing   Skin: Skin is warm and dry. Capillary refill takes less than 2 seconds.  Psychiatric: She has a normal mood and affect. Her behavior is normal.     ED Treatments / Results  Labs (all labs ordered are listed, but only abnormal results are displayed) Labs Reviewed  BASIC METABOLIC PANEL - Abnormal; Notable for the following components:      Result Value   Potassium 3.3 (*)    Glucose, Bld 103 (*)    Creatinine, Ser 1.08 (*)    GFR calc non Af Amer 50 (*)    GFR calc Af Amer 58 (*)    All other components within normal limits  URINALYSIS, ROUTINE W REFLEX MICROSCOPIC - Abnormal; Notable for the following components:   Leukocytes, UA SMALL (*)    Bacteria, UA RARE (*)    Squamous Epithelial / LPF 0-5 (*)    All other components within normal limits  CBC  C-REACTIVE PROTEIN  SEDIMENTATION RATE  MAGNESIUM  CBG MONITORING, ED    EKG  EKG Interpretation  Date/Time:  Friday December 02 2017 18:49:58 EST Ventricular Rate:  101 PR Interval:    QRS Duration: 70 QT Interval:  528 QTC Calculation: 684 R Axis:   61 Text Interpretation:  sinus rhythm Accelerated Junctional rhythm Cannot rule out Anterior infarct , age undetermined Prolonged QT Abnormal ECG QT new Confirmed by Merrily Pew [phone]) on 12/02/2017 9:19:46 PM       Radiology Ct Angio Head W Or Wo Contrast  Result Date: 12/02/2017 CLINICAL DATA:  Dizziness and headache. EXAM: CT ANGIOGRAPHY HEAD AND NECK TECHNIQUE: Multidetector CT imaging of the head and neck was performed using the standard protocol during bolus administration of intravenous contrast. Multiplanar CT image reconstructions and MIPs were obtained to evaluate the vascular anatomy. Carotid stenosis measurements (when applicable) are obtained utilizing NASCET criteria, using the distal internal carotid diameter as the denominator. CONTRAST:  50 mL Isovue 370 COMPARISON:  None. FINDINGS: CT HEAD FINDINGS Brain: No mass lesion,  intraparenchymal hemorrhage or extra-axial collection. No evidence of acute cortical infarct. Brain parenchyma and CSF-containing spaces are normal for age. Vascular: No hyperdense vessel or unexpected calcification. Skull: Normal visualized skull base, calvarium and extracranial soft tissues. Sinuses/Orbits: No sinus fluid levels or advanced mucosal thickening. No mastoid effusion. Normal orbits. CTA NECK FINDINGS Aortic arch: There is mild calcific atherosclerosis of the aortic arch. There is no aneurysm, dissection or hemodynamically significant stenosis of the visualized ascending aorta and aortic arch. Conventional 3 vessel aortic branching pattern. The visualized proximal subclavian arteries are normal. Right carotid system: The right common carotid origin is widely  patent. There is no common carotid or internal carotid artery dissection or aneurysm. No hemodynamically significant stenosis. Left carotid system: The left common carotid origin is widely patent. There is no common carotid or internal carotid artery dissection or aneurysm. No hemodynamically significant stenosis. Vertebral arteries: The vertebral system is left dominant. Both vertebral artery origins are normal. Congenitally diminutive right vertebral artery terminates in the right posterior inferior cerebellar artery. Skeleton: There is no bony spinal canal stenosis. No lytic or blastic lesions. Other neck: The nasopharynx is clear. The oropharynx and hypopharynx are normal. The epiglottis is normal. The supraglottic larynx, glottis and subglottic larynx are normal. No retropharyngeal collection. The parapharyngeal spaces are preserved. The parotid and submandibular glands are normal. No sialolithiasis or salivary ductal dilatation. The thyroid gland is normal. There is no cervical lymphadenopathy. Upper chest: No pneumothorax or pleural effusion. No nodules or masses. Review of the MIP images confirms the above findings CTA HEAD FINDINGS  Anterior circulation: --Intracranial internal carotid arteries: Normal. --Anterior cerebral arteries: Normal. --Middle cerebral arteries: Normal. --Posterior communicating arteries: Absent bilaterally. Posterior circulation: --Posterior cerebral arteries: Normal. --Superior cerebellar arteries: Normal. --Basilar artery: Normal. --Anterior inferior cerebellar arteries: Not clearly visualized --Posterior inferior cerebellar arteries: As above, the right vertebral artery terminates in the right PICA. Left PICA is normal. Venous sinuses: As permitted by contrast timing, patent. 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 Delayed phase: No parenchymal contrast enhancement. Review of the MIP images confirms the above findings IMPRESSION: 1. No aneurysm, vasospasm or intracranial hemorrhage. 2. No emergent large vessel occlusion. 3.  Aortic Atherosclerosis (ICD10-I70.0). Electronically Signed   By: Ulyses Jarred M.D.   On: 12/02/2017 22:58   Ct Angio Neck W And/or Wo Contrast  Result Date: 12/02/2017 CLINICAL DATA:  Dizziness and headache. EXAM: CT ANGIOGRAPHY HEAD AND NECK TECHNIQUE: Multidetector CT imaging of the head and neck was performed using the standard protocol during bolus administration of intravenous contrast. Multiplanar CT image reconstructions and MIPs were obtained to evaluate the vascular anatomy. Carotid stenosis measurements (when applicable) are obtained utilizing NASCET criteria, using the distal internal carotid diameter as the denominator. CONTRAST:  50 mL Isovue 370 COMPARISON:  None. FINDINGS: CT HEAD FINDINGS Brain: No mass lesion, intraparenchymal hemorrhage or extra-axial collection. No evidence of acute cortical infarct. Brain parenchyma and CSF-containing spaces are normal for age. Vascular: No hyperdense vessel or unexpected calcification. Skull: Normal visualized skull base, calvarium and extracranial soft tissues.  Sinuses/Orbits: No sinus fluid levels or advanced mucosal thickening. No mastoid effusion. Normal orbits. CTA NECK FINDINGS Aortic arch: There is mild calcific atherosclerosis of the aortic arch. There is no aneurysm, dissection or hemodynamically significant stenosis of the visualized ascending aorta and aortic arch. Conventional 3 vessel aortic branching pattern. The visualized proximal subclavian arteries are normal. Right carotid system: The right common carotid origin is widely patent. There is no common carotid or internal carotid artery dissection or aneurysm. No hemodynamically significant stenosis. Left carotid system: The left common carotid origin is widely patent. There is no common carotid or internal carotid artery dissection or aneurysm. No hemodynamically significant stenosis. Vertebral arteries: The vertebral system is left dominant. Both vertebral artery origins are normal. Congenitally diminutive right vertebral artery terminates in the right posterior inferior cerebellar artery. Skeleton: There is no bony spinal canal stenosis. No lytic or blastic lesions. Other neck: The nasopharynx is clear. The oropharynx and hypopharynx are normal. The epiglottis is normal. The supraglottic larynx, glottis and subglottic  larynx are normal. No retropharyngeal collection. The parapharyngeal spaces are preserved. The parotid and submandibular glands are normal. No sialolithiasis or salivary ductal dilatation. The thyroid gland is normal. There is no cervical lymphadenopathy. Upper chest: No pneumothorax or pleural effusion. No nodules or masses. Review of the MIP images confirms the above findings CTA HEAD FINDINGS Anterior circulation: --Intracranial internal carotid arteries: Normal. --Anterior cerebral arteries: Normal. --Middle cerebral arteries: Normal. --Posterior communicating arteries: Absent bilaterally. Posterior circulation: --Posterior cerebral arteries: Normal. --Superior cerebellar arteries:  Normal. --Basilar artery: Normal. --Anterior inferior cerebellar arteries: Not clearly visualized --Posterior inferior cerebellar arteries: As above, the right vertebral artery terminates in the right PICA. Left PICA is normal. Venous sinuses: As permitted by contrast timing, patent. 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 Delayed phase: No parenchymal contrast enhancement. Review of the MIP images confirms the above findings IMPRESSION: 1. No aneurysm, vasospasm or intracranial hemorrhage. 2. No emergent large vessel occlusion. 3.  Aortic Atherosclerosis (ICD10-I70.0). Electronically Signed   By: Ulyses Jarred M.D.   On: 12/02/2017 22:58    Procedures Procedures (including critical care time)  Medications Ordered in ED Medications  magnesium sulfate IVPB 1 g 100 mL (1 g Intravenous New Bag/Given 12/02/17 2254)  potassium chloride SA (K-DUR,KLOR-CON) CR tablet 40 mEq (40 mEq Oral Given 12/02/17 2154)  iopamidol (ISOVUE-370) 76 % injection (50 mLs  Contrast Given 12/02/17 2214)     Initial Impression / Assessment and Plan / ED Course  I have reviewed the triage vital signs and the nursing notes.  Pertinent labs & imaging results that were available during my care of the patient were reviewed by me and considered in my medical decision making (see chart for details).  71 yo female with PMH of HTN, Depression, HLD, Migraine, OSA who presents with sudden onset of dizziness and Headache. HR initially mildly tachycardic to 104 but soon resolved. BP mildly elevated to 151/70, normal respirations with normal o2 sats. Neurological exam is normal. Will need to rule out subarachnoid hemorrhage. Obtain CTA head and neck. Will also evaluate for temporal arteritis with CRP/ESR. Additionally initial EKG with prolonged QTc 684 which is new. Repeat EKG with QTc 583. In the setting of prolonged QTc and K is low at 3.3, will give IV mag 1g and kdur 48mq  x 1. Will obtain Mag level.   Magnesium level normal. CRP and ESR are normal, therefore unlikely temporal arteritis. UA with small leukocytes but only 0-5 wbc and 0-5 squamous cells. CTA head and neck negative for intracranial bleed or aneurysm.   Patient's headache is improving. She takes Norco PRN for severe symptoms. Imitrex has not helped in the past. She has Norco at home. She declined a dose in the ED.   We discussed with the patient regarding close follow up with PCP to repeat EKG and electrolytes. Also recommended discussing re-evaluation of certain medications that she is on (Lexapro, Cymbalta, Elavil) which can prolog QTc. ED precautions discussed, including palpitations and syncope.   Patient stable for discharge.   Final Clinical Impressions(s) / ED Diagnoses   Final diagnoses:  Nonintractable headache, unspecified chronicity pattern, unspecified headache type  Prolonged Q-T interval on ECG    ED Discharge Orders    None       GSmiley Houseman MD 12/02/17 2327    MMerrily Pew MD 12/03/17 [id number]

## 2017-12-07 DIAGNOSIS — G4733 Obstructive sleep apnea (adult) (pediatric): Secondary | ICD-10-CM | POA: Diagnosis not present

## 2017-12-08 DIAGNOSIS — G4733 Obstructive sleep apnea (adult) (pediatric): Secondary | ICD-10-CM | POA: Diagnosis not present

## 2017-12-14 ENCOUNTER — Telehealth: Payer: Self-pay | Admitting: Pulmonary Disease

## 2017-12-14 DIAGNOSIS — G4733 Obstructive sleep apnea (adult) (pediatric): Secondary | ICD-10-CM

## 2017-12-14 NOTE — Telephone Encounter (Signed)
Per RA, HST showed moderate OSA with 21 events per hour. Recommends CPAP therapy. RX Autocpap 5-10cm, humidity, DL and OV in 4 weeks with RA.   Will place order for CPAP machine once patient is aware.

## 2017-12-14 NOTE — Telephone Encounter (Signed)
Spoke with patient. She wishes to proceed with CPAP therapy. She would like to use [facility] since she has used them before in the past. Advised patient that I would try to get order sent to them.   Also advised her to call us back when she has received her CPAP machine so that we can get her scheduled for a follow up. She verbalized understanding.   Nothing else needed at time of call.

## 2017-12-15 ENCOUNTER — Telehealth: Payer: Self-pay | Admitting: Pulmonary Disease

## 2017-12-15 ENCOUNTER — Other Ambulatory Visit: Payer: Self-pay | Admitting: *Deleted

## 2017-12-15 DIAGNOSIS — G4733 Obstructive sleep apnea (adult) (pediatric): Secondary | ICD-10-CM

## 2017-12-15 NOTE — Telephone Encounter (Signed)
I have faxed the study to [facility]

## 2017-12-16 DIAGNOSIS — H8110 Benign paroxysmal vertigo, unspecified ear: Secondary | ICD-10-CM | POA: Diagnosis not present

## 2017-12-16 DIAGNOSIS — I4581 Long QT syndrome: Secondary | ICD-10-CM | POA: Diagnosis not present

## 2017-12-16 DIAGNOSIS — Z6829 Body mass index (BMI) 29.0-29.9, adult: Secondary | ICD-10-CM | POA: Diagnosis not present

## 2017-12-16 DIAGNOSIS — R5383 Other fatigue: Secondary | ICD-10-CM | POA: Diagnosis not present

## 2017-12-16 DIAGNOSIS — E876 Hypokalemia: Secondary | ICD-10-CM | POA: Diagnosis not present

## 2017-12-26 ENCOUNTER — Ambulatory Visit: Payer: Medicare Other | Admitting: Pulmonary Disease

## 2018-01-10 IMAGING — RF DG ESOPHAGUS
11 series · 15 of 16 positions shown · non-contrast
Comparison: None.

CLINICAL DATA: Diaphragm dysfunction, difficulty breathing.

EXAM:
ESOPHOGRAM / BARIUM SWALLOW / BARIUM TABLET STUDY
TECHNIQUE: Combined double contrast and single contrast examination performed
using effervescent crystals, thick barium liquid, and thin barium
liquid. The patient was observed with fluoroscopy swallowing a 13 mm
barium sulphate tablet.
FLUOROSCOPY TIME:  Fluoroscopy Time:  1 minutes 12 second
Radiation Exposure Index (if provided by the fluoroscopic device):
7.7 mGy
Number of Acquired Spot Images: 1

[Series 2: cp_standard · 0.37mm/px · 4 of 41 frames shown (1 of 10)]
[frame 7/41]
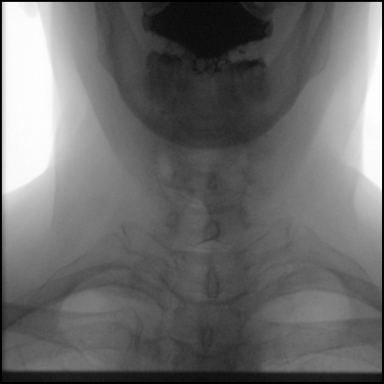
[frame 19/41]
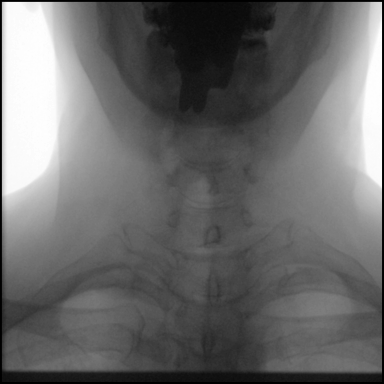
[frame 21/41]
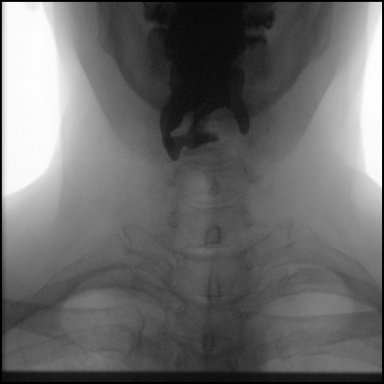
[frame 35/41]
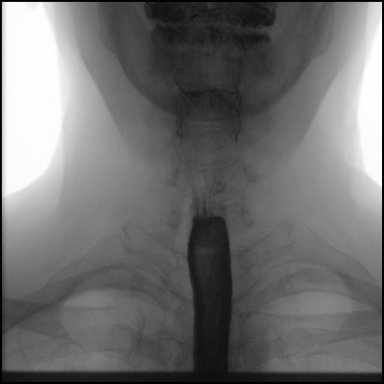

[Series 3: cp_standard · 0.19mm/px · 1 of 1 slices shown (2 of 10)]
[im 1/1]
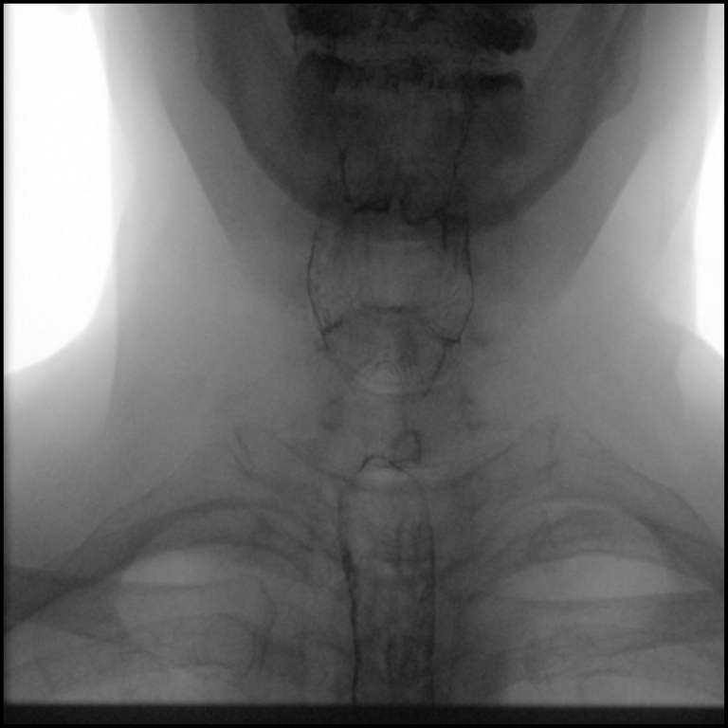

[Series 4: cp_standard · 0.37mm/px · 2 of 46 frames shown (3 of 10)]
[frame 7/46]
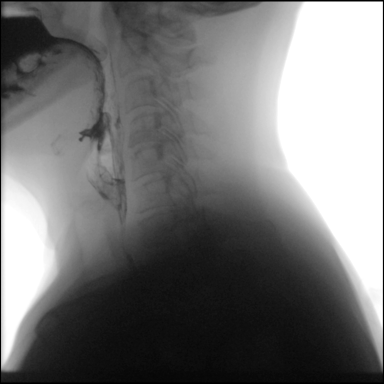
[frame 24/46]
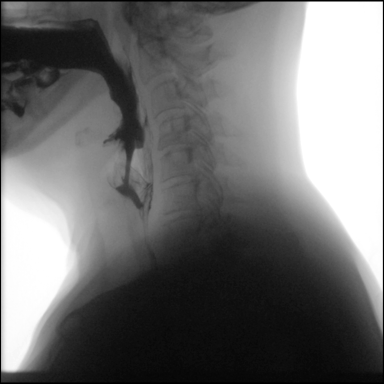

[Series 5: cp_standard · 0.18mm/px · 1 of 1 slices shown (4 of 10)]
[im 1/1]
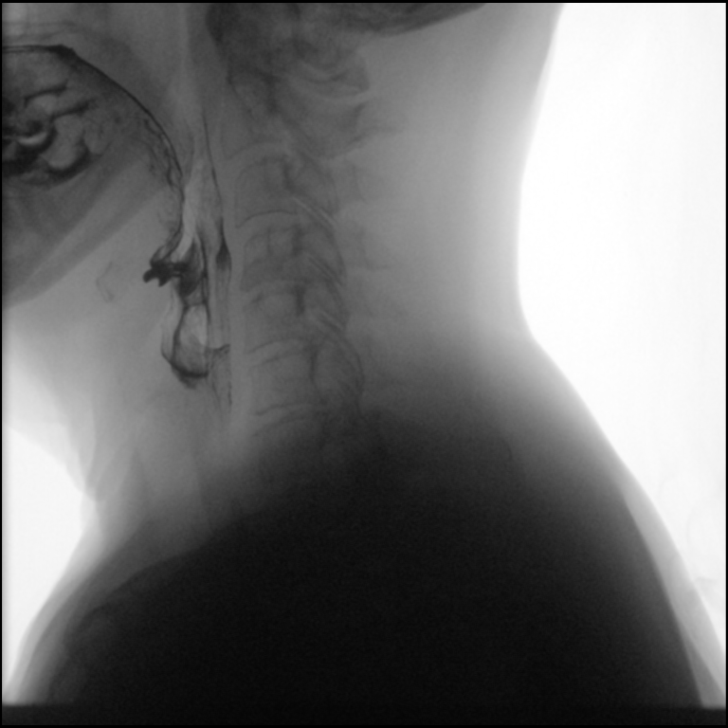

[Series 6: cp_standard · 0.28mm/px · 1 of 1 slices shown (5 of 10)]
[im 1/1]
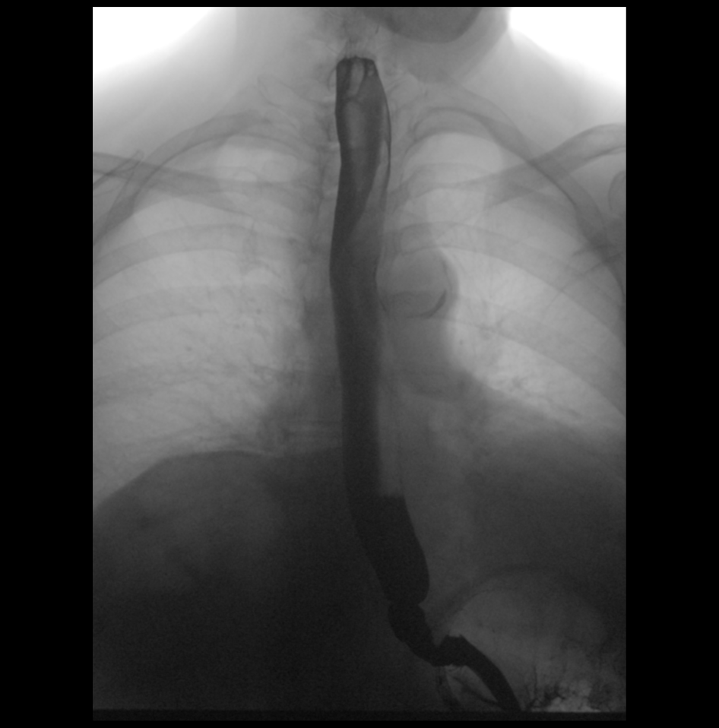

[Series 7: cp_standard · 0.28mm/px · 1 of 1 slices shown (6 of 10)]
[im 1/1]
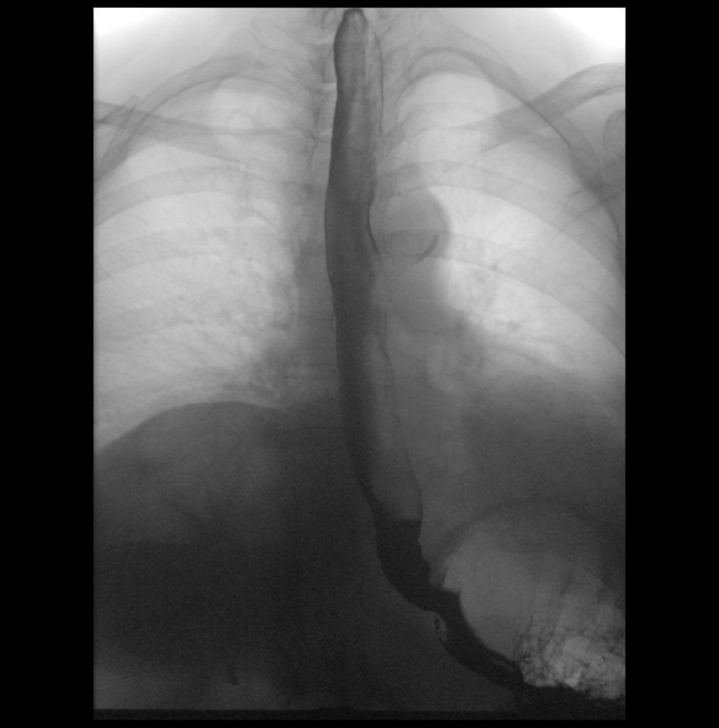

[Series 8: cp_standard · 0.28mm/px · 1 of 1 slices shown (7 of 10)]
[im 1/1]
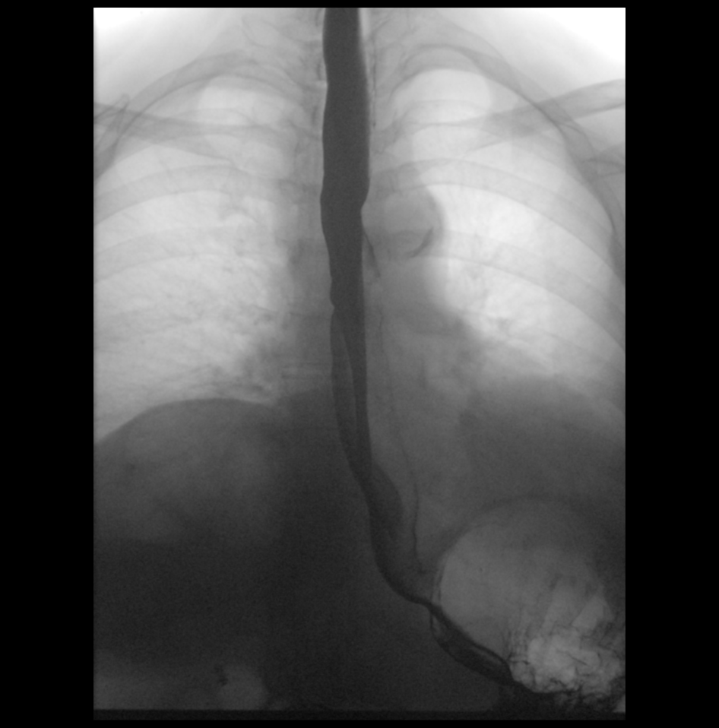

[Series 9: cp_standard · 0.28mm/px · 1 of 1 slices shown (8 of 10)]
[im 1/1]
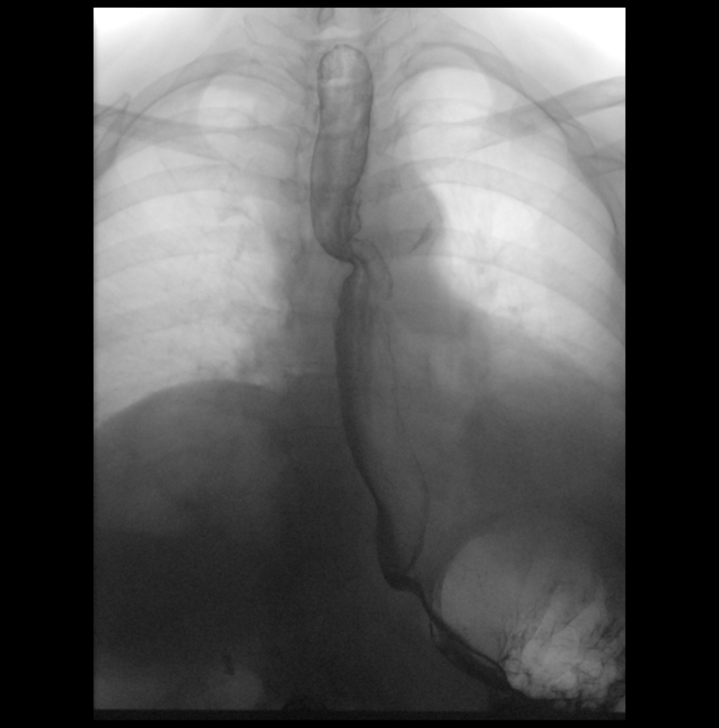

[Series 10: cp_standard · 0.28mm/px · 1 of 1 slices shown (9 of 10)]
[im 1/1]
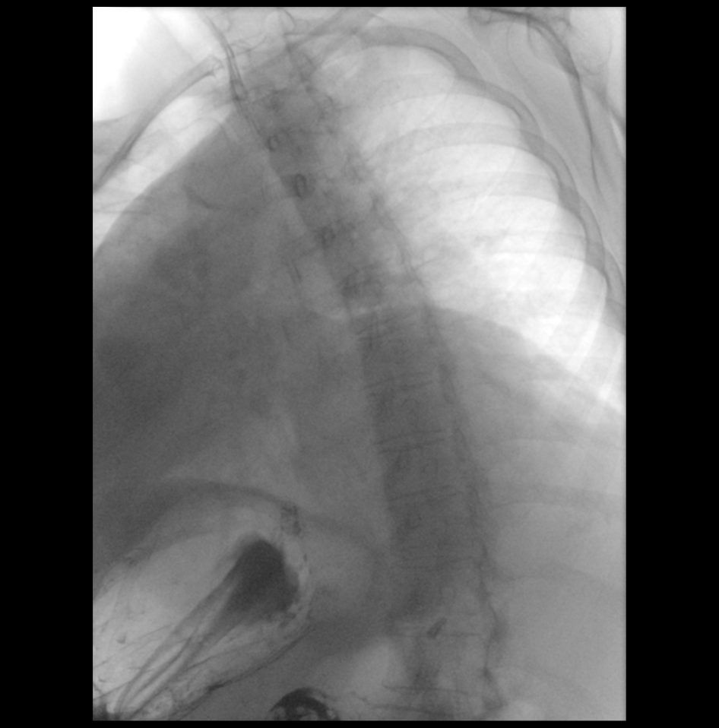

[Series 11: cp_standard · 0.28mm/px · 1 of 1 slices shown (10 of 10)]
[im 1/1]
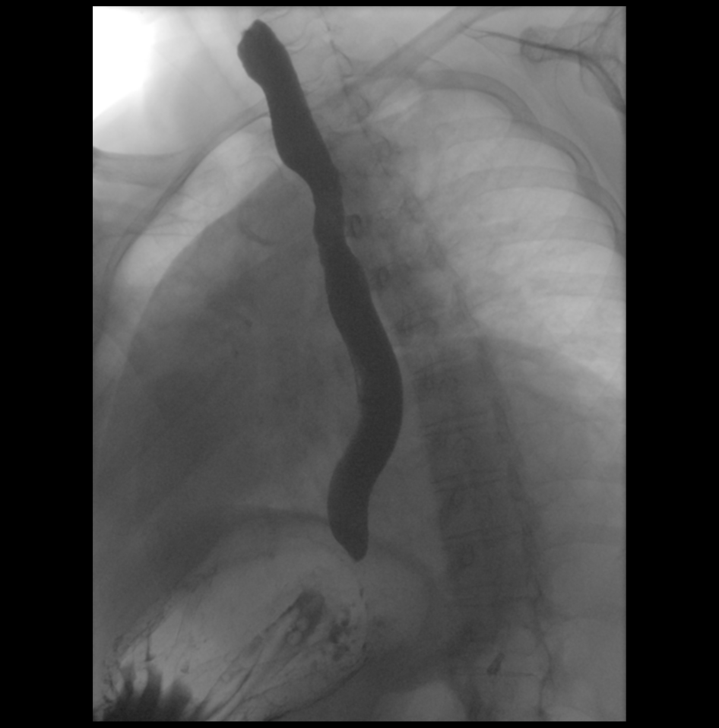

[Series 12: fluoro_barium 2fps_bw · 0.19mm/px · 1 of 1 slices shown]
[im 1/1]
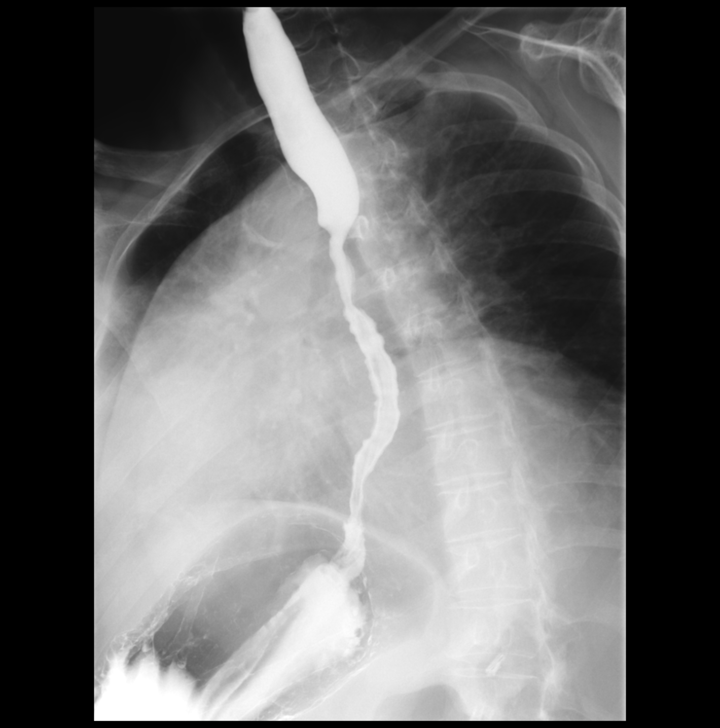

[15 of 16 positions shown; findings below may reference images not displayed]

FINDINGS: Normal swallowing mechanism. Normal esophageal motility. No
esophageal fold thickening, stricture or obstruction. A 13 mm barium
tablet passed into the stomach without difficulty.
IMPRESSION: Normal exam.

## 2018-01-31 ENCOUNTER — Ambulatory Visit: Payer: Medicare Other | Admitting: Pulmonary Disease

## 2018-02-01 ENCOUNTER — Ambulatory Visit: Payer: Medicare Other | Admitting: Pulmonary Disease

## 2018-02-10 ENCOUNTER — Encounter: Payer: Self-pay | Admitting: Pulmonary Disease

## 2018-02-10 ENCOUNTER — Ambulatory Visit (INDEPENDENT_AMBULATORY_CARE_PROVIDER_SITE_OTHER): Payer: Medicare Other | Admitting: Pulmonary Disease

## 2018-02-10 VITALS — BP 120/74 | HR 104 | Ht 63.0 in | Wt 169.0 lb

## 2018-02-10 DIAGNOSIS — G4736 Sleep related hypoventilation in conditions classified elsewhere: Secondary | ICD-10-CM | POA: Diagnosis not present

## 2018-02-10 NOTE — Assessment & Plan Note (Signed)
She has noticed improvement in her headaches which is a good sign. Check nocturnal oximetry on CPAP/room air since sleep related hypoxia was the initial concern

## 2018-02-10 NOTE — Patient Instructions (Signed)
CPAP is working well on current settings. Check nocturnal oximetry on CPAP/room air

## 2018-02-10 NOTE — Progress Notes (Signed)
   Subjective:    Patient ID: Kim Chavez, female    DOB: 13-Sep-1946, 72 y.o.   MRN: [id number]  HPI  72 year old remote smoker for FU of mild OSA She sees neurology for chronic migraine headaches and is maintained on propranolol. Of note, she underwent surgery in 2016 for large liver cysts.  She was on CPAP for over 5 years since 2012.  On retesting by neurology she was only felt to have very mild OSA but more significant hypoxia and hence was referred to [place].  The study only had 1 hour of supine sleep. We treated her home sleep study which showed more moderate OSA consistent with her symptoms.  She was started on auto CPAP 5-10 cm, she chose a full facemask due to history of allergies and has done well with this and settled down with this.  She feels that her headaches are decreased and has noticed improvement in her daytime somnolence and fatigue.  Styloid was reviewed which shows average pressure of 10 cm with residual AHI of 5/however no centrals and excellent compliance 6.5 hours every night  She also reports that she has significant allergies and uses Flonase and over-the-counter antihistaminic during spring and fall  We discussed all of interim tests in detail  Significant tests/ events reviewed  PSG 02/2017 showed AHI of 5/hour with total sleep time of 5 hours, she slept supine for 1 hour with AHI of 25/hour.  Lowest desaturation was 76%, she spent 195 minutes with saturation less than 88%.    PSG in 08/2011 >> AHI of 22/hour, supine AHI was 61/hour.  CPAP was titrated to 7 cm  HST 11/2017 >>moderate OSA with 21 events per hour.   CT scan from 04/2015 and chest x-ray from 07/2015  showed elevated right hemidiaphragm. Sniff test 10/2017 normal movement of BL diaphragms  10/2017 esophagram - normal  Review of Systems neg for any significant sore throat, dysphagia, itching, sneezing, nasal congestion or excess/ purulent secretions, fever, chills, sweats, unintended wt loss,  pleuritic or exertional cp, hempoptysis, orthopnea pnd or change in chronic leg swelling. Also denies presyncope, palpitations, heartburn, abdominal pain, nausea, vomiting, diarrhea or change in bowel or urinary habits, dysuria,hematuria, rash, arthralgias, visual complaints, headache, numbness weakness or ataxia.     Objective:   Physical Exam   Gen. Pleasant, well-nourished, in no distress ENT - no thrush, no post nasal drip Neck: No JVD, no thyromegaly, no carotid bruits Lungs: no use of accessory muscles, no dullness to percussion, clear without rales or rhonchi  Cardiovascular: Rhythm regular, heart sounds  normal, no murmurs or gallops, no peripheral edema Musculoskeletal: No deformities, no cyanosis or clubbing         Assessment & Plan:

## 2018-02-10 NOTE — Assessment & Plan Note (Signed)
CPAP is working well on current settings. Can change to fixed pressure of 10 cm   Weight loss encouraged, compliance with goal of at least 4-6 hrs every night is the expectation. Advised against medications with sedative side effects Cautioned against driving when sleepy - understanding that sleepiness will vary on a day to day basis

## 2018-02-16 ENCOUNTER — Encounter: Payer: Self-pay | Admitting: Pulmonary Disease

## 2018-02-16 DIAGNOSIS — R0902 Hypoxemia: Secondary | ICD-10-CM | POA: Diagnosis not present

## 2018-02-16 DIAGNOSIS — J449 Chronic obstructive pulmonary disease, unspecified: Secondary | ICD-10-CM | POA: Diagnosis not present

## 2018-02-23 ENCOUNTER — Telehealth: Payer: Self-pay | Admitting: Pulmonary Disease

## 2018-02-23 NOTE — Telephone Encounter (Signed)
Per RA, ONO showed hypoxia has improved. D/C O2 for 1 month and report back.

## 2018-02-24 NOTE — Telephone Encounter (Signed)
Sorry, my mistake. Does not need oxygen-  levels look good on CPAP alone

## 2018-02-24 NOTE — Telephone Encounter (Signed)
Spoke with pt to relay ONO results.  Pt states she does not have oxygen. It does not appear that our office has ever placed an order for O2.  RA please advise.

## 2018-03-01 NOTE — Telephone Encounter (Signed)
Called pt letting her know she does not need to wear O2 due to levels being good on CPAP alone.  Pt expressed understanding. Nothing further needed at this current time.

## 2018-04-17 DIAGNOSIS — Z961 Presence of intraocular lens: Secondary | ICD-10-CM | POA: Diagnosis not present

## 2018-04-17 DIAGNOSIS — H43813 Vitreous degeneration, bilateral: Secondary | ICD-10-CM | POA: Diagnosis not present

## 2018-04-17 DIAGNOSIS — H04123 Dry eye syndrome of bilateral lacrimal glands: Secondary | ICD-10-CM | POA: Diagnosis not present

## 2018-04-17 DIAGNOSIS — H52203 Unspecified astigmatism, bilateral: Secondary | ICD-10-CM | POA: Diagnosis not present

## 2018-05-04 DIAGNOSIS — Z86010 Personal history of colonic polyps: Secondary | ICD-10-CM | POA: Diagnosis not present

## 2018-05-04 DIAGNOSIS — K5900 Constipation, unspecified: Secondary | ICD-10-CM | POA: Diagnosis not present

## 2018-05-04 DIAGNOSIS — K5090 Crohn's disease, unspecified, without complications: Secondary | ICD-10-CM | POA: Diagnosis not present

## 2018-06-05 DIAGNOSIS — R197 Diarrhea, unspecified: Secondary | ICD-10-CM | POA: Diagnosis not present

## 2018-06-05 DIAGNOSIS — R194 Change in bowel habit: Secondary | ICD-10-CM | POA: Diagnosis not present

## 2018-06-12 DIAGNOSIS — R194 Change in bowel habit: Secondary | ICD-10-CM | POA: Diagnosis not present

## 2018-07-05 DIAGNOSIS — R1084 Generalized abdominal pain: Secondary | ICD-10-CM | POA: Diagnosis not present

## 2018-07-05 DIAGNOSIS — R194 Change in bowel habit: Secondary | ICD-10-CM | POA: Diagnosis not present

## 2018-07-05 DIAGNOSIS — Z8719 Personal history of other diseases of the digestive system: Secondary | ICD-10-CM | POA: Diagnosis not present

## 2018-07-05 DIAGNOSIS — D126 Benign neoplasm of colon, unspecified: Secondary | ICD-10-CM | POA: Diagnosis not present

## 2018-07-07 DIAGNOSIS — D126 Benign neoplasm of colon, unspecified: Secondary | ICD-10-CM | POA: Diagnosis not present

## 2018-07-21 DIAGNOSIS — Z01419 Encounter for gynecological examination (general) (routine) without abnormal findings: Secondary | ICD-10-CM | POA: Diagnosis not present

## 2018-07-21 DIAGNOSIS — Z6830 Body mass index (BMI) 30.0-30.9, adult: Secondary | ICD-10-CM | POA: Diagnosis not present

## 2018-08-16 ENCOUNTER — Ambulatory Visit: Payer: Medicare Other | Admitting: Adult Health

## 2018-08-21 ENCOUNTER — Encounter: Payer: Self-pay | Admitting: Adult Health

## 2018-08-21 ENCOUNTER — Ambulatory Visit (INDEPENDENT_AMBULATORY_CARE_PROVIDER_SITE_OTHER): Payer: Medicare Other | Admitting: Adult Health

## 2018-08-21 DIAGNOSIS — G4733 Obstructive sleep apnea (adult) (pediatric): Secondary | ICD-10-CM | POA: Diagnosis not present

## 2018-08-21 NOTE — Assessment & Plan Note (Signed)
Well controlled on CPAP   Plan  Patient Instructions  Keep up the good work .  Continue on CPAP At bedtime  .  Work on healthy weight  Do not drive if sleepy  Follow up with Dr. Elsworth Soho  In 1 yea r and As needed      '

## 2018-08-21 NOTE — Patient Instructions (Signed)
Keep up the good work .  Continue on CPAP At bedtime  .  Work on healthy weight  Do not drive if sleepy  Follow up with Dr. Elsworth Soho  In 1 yea r and As needed

## 2018-08-21 NOTE — Progress Notes (Signed)
@Patient  ID: Kim Chavez, female    DOB: 03-02-1946, 72 y.o.   MRN: [id number]  Chief Complaint  Patient presents with  . Follow-up    OSA    Referring provider: Marton Redwood, MD  HPI: 72 yo female followed for OSA .  PMH significant for Migraines , Liver cyst s/p resection   TEST/Events  PSG 02/2017 showed AHI of 5/hour with total sleep time of 5 hours, she slept supine for 1 hour with AHI of 25/hour. Lowest desaturation was 76%, she spent 195 minutes with saturation less than 88%.   PSG in 08/2011 >> AHI of 22/hour, supine AHI was 61/hour. CPAP was titrated to 7 cm  HST 11/2017 >>moderate OSA with 21 events per hour.   CT scan from 04/2015 and chest x-ray from 07/2015  showed elevated right hemidiaphragm. Sniff test 10/2017 normal movement of BL diaphragms  10/2017 esophagram - normal  ONO 02/2018 neg on CPAP -no need for O2   08/21/2018 Follow up : OSA  Pt returns for 6 month follow up for sleep apnea. Says she is doing well on CPAP . Feels rested with no significant daytime sleepiness. Download shows excellent control with  avg usage at 6.5hr. AHI 3.6 . Min leaks . On CPAP 5 to 10cmH2o.  Needs supplies order to DME.  Says she is staying active.    Allergies  Allergen Reactions  . Erythromycin Nausea And Vomiting  . Tape Rash  . Aspirin Other (See Comments)    Almost caused ulcers  . Codeine Nausea And Vomiting    'Deathly sick on stomach"   . Ibuprofen Other (See Comments)    Has Crohn's and was instructed to NOT take  . Other Diarrhea    Cramping also- Nothing with seeds/has Crohn's!!  . Penicillins Swelling    Arms became swollen Has patient had a PCN reaction causing immediate rash, facial/tongue/throat swelling, SOB or lightheadedness with hypotension: Yes Has patient had a PCN reaction causing severe rash involving mucus membranes or skin necrosis: No Has patient had a PCN reaction that required hospitalization: No Has patient had a PCN reaction  occurring within the last 10 years: No If all of the above answers are "NO", then may proceed with Cephalosporin use.   . Neosporin [Neomycin-Bacitracin Zn-Polymyx] Itching, Rash and Other (See Comments)    Turns red around the site and itches like crazy for about two weeks.     Immunization History  Administered Date(s) Administered  . Influenza Split 08/18/2017  . Td 11/12/1993    Past Medical History:  Diagnosis Date  . Anxiety   . Breast cancer ([place]) 2011   BILATERAL BREAST REMOVED  . Depression   . Fibromyalgia   . GERD (gastroesophageal reflux disease)   . Hypertension   . Hypothyroidism   . Migraine    MIGRAINES  . Overactive bladder   . Pneumonia    several times  . RUQ pain   . Sleep apnea    uses c-pap machine  . Thyroid disease     Tobacco History: Social History   Tobacco Use  Smoking Status Former Smoker  . Last attempt to quit: 09/12/1975  . Years since quitting: 42.9  Smokeless Tobacco Never Used   Counseling given: Not Answered   Outpatient Medications Prior to Visit  Medication Sig Dispense Refill  . Cholecalciferol (VITAMIN D3) 5000 units CAPS Take 5,000 Units by mouth daily.    . Coenzyme Q10 (CO Q-10) 100 MG CAPS Take 200  mg by mouth every morning.     . DULoxetine (CYMBALTA) 60 MG capsule Take 60 mg by mouth at bedtime.     . fluticasone (FLONASE) 50 MCG/ACT nasal spray Place 2 sprays into both nostrils daily as needed for allergies.     Marland Kitchen HYDROcodone-acetaminophen (NORCO/VICODIN) 5-325 MG tablet Take 1 tablet by mouth every six hours as needed for severe headache pain  0  . levothyroxine (SYNTHROID, LEVOTHROID) 50 MCG tablet Take 50 mcg by mouth daily before breakfast.    . Multiple Vitamins-Minerals (MULTI ADULT GUMMIES) CHEW Chew 2 tablets by mouth daily.    . NON FORMULARY CPAP: At bedtime (nightly)    . omeprazole (PRILOSEC) 20 MG capsule Take 40 mg by mouth at bedtime.     . Probiotic Product (PROBIOTIC DAILY PO) Take 2 tablets by  mouth every morning.     . simvastatin (ZOCOR) 20 MG tablet Take 20 mg by mouth at bedtime.     Marland Kitchen zolpidem (AMBIEN) 10 MG tablet Take 10 mg by mouth at bedtime as needed for sleep.    . calcium carbonate (TUMS EX) 750 MG chewable tablet Chew 1 tablet by mouth daily.    . diazepam (VALIUM) 5 MG tablet Take 1 tablet (5 mg total) by mouth every 6 (six) hours as needed for anxiety. (Patient not taking: Reported on 08/21/2018) 30 tablet 1  . temazepam (RESTORIL) 15 MG capsule Take 15 mg at bedtime as needed by mouth for sleep.    Marland Kitchen topiramate (TOPAMAX) 50 MG tablet Take 50 mg by mouth 2 (two) times daily.     No facility-administered medications prior to visit.      Review of Systems  Constitutional:   No  weight loss, night sweats,  Fevers, chills, fatigue, or  lassitude.  HEENT:   No headaches,  Difficulty swallowing,  Tooth/dental problems, or  Sore throat,                No sneezing, itching, ear ache, nasal congestion, post nasal drip,   CV:  No chest pain,  Orthopnea, PND, swelling in lower extremities, anasarca, dizziness, palpitations, syncope.   GI  No heartburn, indigestion, abdominal pain, nausea, vomiting, diarrhea, change in bowel habits, loss of appetite, bloody stools.   Resp: No shortness of breath with exertion or at rest.  No excess mucus, no productive cough,  No non-productive cough,  No coughing up of blood.  No change in color of mucus.  No wheezing.  No chest wall deformity  Skin: no rash or lesions.  GU: no dysuria, change in color of urine, no urgency or frequency.  No flank pain, no hematuria   MS:  No joint pain or swelling.  No decreased range of motion.  No back pain.    Physical Exam  BP 138/80   Pulse 70   Ht 5' 3.78" (1.62 m)   Wt 176 lb 3.2 oz (79.9 kg)   SpO2 94%   BMI 30.45 kg/m   GEN: A/Ox3; pleasant , NAD, well nourished    HEENT:  [place]/AT, , NOSE-clear, THROAT-clear, no lesions, no postnasal drip or exudate noted. Class 2 MP airway   NECK:   Supple w/ fair ROM; no JVD; normal carotid impulses w/o bruits; no thyromegaly or nodules palpated; no lymphadenopathy.    RESP  Clear  P & A; w/o, wheezes/ rales/ or rhonchi. no accessory muscle use, no dullness to percussion  CARD:  RRR, no m/r/g, no peripheral edema, pulses intact, no  cyanosis or clubbing.  GI:   Soft & nt; nml bowel sounds; no organomegaly or masses detected.   Musco: Warm bil, no deformities or joint swelling noted.   Neuro: alert, no focal deficits noted.    Skin: Warm, no lesions or rashes    Lab Results:  CBC   ProBNP No results found for: PROBNP  Imaging: No results found.   Assessment & Plan:   OSA (obstructive sleep apnea) Well controlled on CPAP   Plan  Patient Instructions  Keep up the good work .  Continue on CPAP At bedtime  .  Work on healthy weight  Do not drive if sleepy  Follow up with Dr. Elsworth Soho  In 1 yea r and As needed      '     [place], NP 08/21/2018

## 2018-08-21 NOTE — Addendum Note (Signed)
Addended by: Parke Poisson E on: 08/21/2018 05:29 PM   Modules accepted: Orders

## 2018-08-22 DIAGNOSIS — E038 Other specified hypothyroidism: Secondary | ICD-10-CM | POA: Diagnosis not present

## 2018-08-22 DIAGNOSIS — M859 Disorder of bone density and structure, unspecified: Secondary | ICD-10-CM | POA: Diagnosis not present

## 2018-08-22 DIAGNOSIS — R7301 Impaired fasting glucose: Secondary | ICD-10-CM | POA: Diagnosis not present

## 2018-08-22 DIAGNOSIS — R82998 Other abnormal findings in urine: Secondary | ICD-10-CM | POA: Diagnosis not present

## 2018-08-22 DIAGNOSIS — E785 Hyperlipidemia, unspecified: Secondary | ICD-10-CM | POA: Diagnosis not present

## 2018-08-22 DIAGNOSIS — I10 Essential (primary) hypertension: Secondary | ICD-10-CM | POA: Diagnosis not present

## 2018-08-29 DIAGNOSIS — Z6830 Body mass index (BMI) 30.0-30.9, adult: Secondary | ICD-10-CM | POA: Diagnosis not present

## 2018-08-29 DIAGNOSIS — I10 Essential (primary) hypertension: Secondary | ICD-10-CM | POA: Diagnosis not present

## 2018-08-29 DIAGNOSIS — E7849 Other hyperlipidemia: Secondary | ICD-10-CM | POA: Diagnosis not present

## 2018-08-29 DIAGNOSIS — R945 Abnormal results of liver function studies: Secondary | ICD-10-CM | POA: Diagnosis not present

## 2018-08-29 DIAGNOSIS — K5090 Crohn's disease, unspecified, without complications: Secondary | ICD-10-CM | POA: Diagnosis not present

## 2018-08-29 DIAGNOSIS — G4709 Other insomnia: Secondary | ICD-10-CM | POA: Diagnosis not present

## 2018-08-29 DIAGNOSIS — C50919 Malignant neoplasm of unspecified site of unspecified female breast: Secondary | ICD-10-CM | POA: Diagnosis not present

## 2018-08-29 DIAGNOSIS — K7689 Other specified diseases of liver: Secondary | ICD-10-CM | POA: Diagnosis not present

## 2018-08-29 DIAGNOSIS — E038 Other specified hypothyroidism: Secondary | ICD-10-CM | POA: Diagnosis not present

## 2018-08-29 DIAGNOSIS — Z0000 Encounter for general adult medical examination without abnormal findings: Secondary | ICD-10-CM | POA: Diagnosis not present

## 2018-08-29 DIAGNOSIS — M859 Disorder of bone density and structure, unspecified: Secondary | ICD-10-CM | POA: Diagnosis not present

## 2018-08-29 DIAGNOSIS — R7301 Impaired fasting glucose: Secondary | ICD-10-CM | POA: Diagnosis not present

## 2018-08-30 ENCOUNTER — Other Ambulatory Visit: Payer: Self-pay | Admitting: Internal Medicine

## 2018-08-30 DIAGNOSIS — R7989 Other specified abnormal findings of blood chemistry: Secondary | ICD-10-CM

## 2018-08-30 DIAGNOSIS — R945 Abnormal results of liver function studies: Principal | ICD-10-CM

## 2018-09-21 ENCOUNTER — Other Ambulatory Visit: Payer: Medicare Other

## 2018-09-25 ENCOUNTER — Other Ambulatory Visit: Payer: Medicare Other

## 2018-10-04 ENCOUNTER — Ambulatory Visit
Admission: RE | Admit: 2018-10-04 | Discharge: 2018-10-04 | Disposition: A | Discharge status: Home / Self Care | Payer: Medicare Other | Source: Ambulatory Visit | Attending: Internal Medicine | Admitting: Internal Medicine

## 2018-10-04 DIAGNOSIS — R945 Abnormal results of liver function studies: Secondary | ICD-10-CM | POA: Diagnosis not present

## 2018-10-04 DIAGNOSIS — R7989 Other specified abnormal findings of blood chemistry: Secondary | ICD-10-CM

## 2018-10-31 DIAGNOSIS — Z23 Encounter for immunization: Secondary | ICD-10-CM | POA: Diagnosis not present

## 2018-11-03 DIAGNOSIS — D0502 Lobular carcinoma in situ of left breast: Secondary | ICD-10-CM | POA: Diagnosis not present

## 2018-11-15 ENCOUNTER — Other Ambulatory Visit: Payer: Self-pay

## 2018-11-15 ENCOUNTER — Observation Stay (HOSPITAL_COMMUNITY)
Admission: EM | Admit: 2018-11-15 | Discharge: 2018-11-16 | Disposition: A | Discharge status: Home / Self Care | Payer: Medicare Other | Attending: Internal Medicine | Admitting: Internal Medicine

## 2018-11-15 ENCOUNTER — Emergency Department (HOSPITAL_COMMUNITY): Payer: Medicare Other

## 2018-11-15 ENCOUNTER — Encounter (HOSPITAL_COMMUNITY): Payer: Self-pay | Admitting: Emergency Medicine

## 2018-11-15 DIAGNOSIS — I10 Essential (primary) hypertension: Secondary | ICD-10-CM | POA: Insufficient documentation

## 2018-11-15 DIAGNOSIS — Z79899 Other long term (current) drug therapy: Secondary | ICD-10-CM | POA: Diagnosis not present

## 2018-11-15 DIAGNOSIS — K219 Gastro-esophageal reflux disease without esophagitis: Secondary | ICD-10-CM | POA: Diagnosis not present

## 2018-11-15 DIAGNOSIS — R7989 Other specified abnormal findings of blood chemistry: Secondary | ICD-10-CM | POA: Diagnosis not present

## 2018-11-15 DIAGNOSIS — G4700 Insomnia, unspecified: Secondary | ICD-10-CM | POA: Insufficient documentation

## 2018-11-15 DIAGNOSIS — G4733 Obstructive sleep apnea (adult) (pediatric): Secondary | ICD-10-CM | POA: Diagnosis present

## 2018-11-15 DIAGNOSIS — M797 Fibromyalgia: Secondary | ICD-10-CM

## 2018-11-15 DIAGNOSIS — I700 Atherosclerosis of aorta: Secondary | ICD-10-CM | POA: Diagnosis not present

## 2018-11-15 DIAGNOSIS — R002 Palpitations: Secondary | ICD-10-CM | POA: Diagnosis not present

## 2018-11-15 DIAGNOSIS — E876 Hypokalemia: Secondary | ICD-10-CM

## 2018-11-15 DIAGNOSIS — R079 Chest pain, unspecified: Principal | ICD-10-CM | POA: Diagnosis present

## 2018-11-15 DIAGNOSIS — E039 Hypothyroidism, unspecified: Secondary | ICD-10-CM | POA: Insufficient documentation

## 2018-11-15 DIAGNOSIS — Z87891 Personal history of nicotine dependence: Secondary | ICD-10-CM | POA: Diagnosis not present

## 2018-11-15 DIAGNOSIS — E785 Hyperlipidemia, unspecified: Secondary | ICD-10-CM | POA: Insufficient documentation

## 2018-11-15 DIAGNOSIS — I361 Nonrheumatic tricuspid (valve) insufficiency: Secondary | ICD-10-CM | POA: Diagnosis not present

## 2018-11-15 DIAGNOSIS — R9431 Abnormal electrocardiogram [ECG] [EKG]: Secondary | ICD-10-CM | POA: Diagnosis not present

## 2018-11-15 DIAGNOSIS — Z79890 Hormone replacement therapy (postmenopausal): Secondary | ICD-10-CM | POA: Diagnosis not present

## 2018-11-15 DIAGNOSIS — R072 Precordial pain: Secondary | ICD-10-CM

## 2018-11-15 LAB — BASIC METABOLIC PANEL
Anion gap: 16 — ABNORMAL HIGH (ref 5–15)
BUN: 13 mg/dL (ref 8–23)
CHLORIDE: 100 mmol/L (ref 98–111)
CO2: 25 mmol/L (ref 22–32)
CREATININE: 1.36 mg/dL — AB (ref 0.44–1.00)
Calcium: 10 mg/dL (ref 8.9–10.3)
GFR calc Af Amer: 45 mL/min — ABNORMAL LOW (ref >60)
GFR calc non Af Amer: 39 mL/min — ABNORMAL LOW (ref >60)
Glucose, Bld: 100 mg/dL — ABNORMAL HIGH (ref 70–99)
Potassium: 3.1 mmol/L — ABNORMAL LOW (ref 3.5–5.1)
Sodium: 141 mmol/L (ref 135–145)

## 2018-11-15 LAB — I-STAT TROPONIN, ED: Troponin i, poc: 0 ng/mL (ref 0.00–0.08)

## 2018-11-15 LAB — CBC
HCT: 47.3 % — ABNORMAL HIGH (ref 36.0–46.0)
Hemoglobin: 15.3 g/dL — ABNORMAL HIGH (ref 12.0–15.0)
MCH: 26.3 pg (ref 26.0–34.0)
MCHC: 32.3 g/dL (ref 30.0–36.0)
MCV: 81.4 fL (ref 80.0–100.0)
Platelets: 320 10*3/uL (ref 150–400)
RBC: 5.81 MIL/uL — ABNORMAL HIGH (ref 3.87–5.11)
RDW: 13 % (ref 11.5–15.5)
WBC: 8.9 10*3/uL (ref 4.0–10.5)
nRBC: 0 % (ref 0.0–0.2)

## 2018-11-15 LAB — TSH: TSH: 1.253 u[IU]/mL (ref 0.350–4.500)

## 2018-11-15 LAB — MAGNESIUM: Magnesium: 2.3 mg/dL (ref 1.7–2.4)

## 2018-11-15 LAB — TROPONIN I: Troponin I: <0.03 ng/mL (ref <0.03)

## 2018-11-15 LAB — MRSA PCR SCREENING: MRSA by PCR: NEGATIVE

## 2018-11-15 MED ORDER — NITROGLYCERIN 2 % TD OINT
1.0000 [in_us] | TOPICAL_OINTMENT | Freq: Four times a day (QID) | TRANSDERMAL | Status: DC
  Administered 2018-11-15: 1 [in_us] via TOPICAL
  Filled 2018-11-15: qty 1

## 2018-11-15 MED ORDER — SIMVASTATIN 20 MG PO TABS: 20.0000 mg | ORAL_TABLET | Freq: Every day | ORAL | Status: DC

## 2018-11-15 MED ORDER — ONDANSETRON HCL 4 MG/2ML IJ SOLN: 4.0000 mg | Freq: Four times a day (QID) | INTRAMUSCULAR | Status: DC | PRN

## 2018-11-15 MED ORDER — LEVOTHYROXINE SODIUM 50 MCG PO TABS
50.0000 ug | ORAL_TABLET | Freq: Every day | ORAL | Status: DC
  Administered 2018-11-16: 50 ug via ORAL
  Filled 2018-11-15: qty 1

## 2018-11-15 MED ORDER — ACETAMINOPHEN 325 MG PO TABS: 650.0000 mg | ORAL_TABLET | ORAL | Status: DC | PRN

## 2018-11-15 MED ORDER — POTASSIUM CHLORIDE CRYS ER 20 MEQ PO TBCR
40.0000 meq | EXTENDED_RELEASE_TABLET | Freq: Once | ORAL | Status: AC
  Administered 2018-11-15: 40 meq via ORAL
  Filled 2018-11-15: qty 2

## 2018-11-15 MED ORDER — VITAMIN D 25 MCG (1000 UNIT) PO TABS
5000.0000 [IU] | ORAL_TABLET | Freq: Every day | ORAL | Status: DC
  Filled 2018-11-15: qty 5

## 2018-11-15 MED ORDER — MORPHINE SULFATE (PF) 4 MG/ML IV SOLN
4.0000 mg | Freq: Once | INTRAVENOUS | Status: AC
  Administered 2018-11-15: 4 mg via INTRAVENOUS
  Filled 2018-11-15: qty 1

## 2018-11-15 MED ORDER — NITROGLYCERIN 0.4 MG SL SUBL
0.4000 mg | SUBLINGUAL_TABLET | SUBLINGUAL | Status: DC | PRN
  Administered 2018-11-16 (×2): 0.4 mg via SUBLINGUAL
  Filled 2018-11-15 (×2): qty 1

## 2018-11-15 MED ORDER — CO Q-10 100 MG PO CAPS: 200.0000 mg | ORAL_CAPSULE | Freq: Every morning | ORAL | Status: DC

## 2018-11-15 MED ORDER — HYDRALAZINE HCL 20 MG/ML IJ SOLN: 5.0000 mg | INTRAMUSCULAR | Status: DC | PRN

## 2018-11-15 MED ORDER — ASPIRIN 81 MG PO CHEW
324.0000 mg | CHEWABLE_TABLET | Freq: Once | ORAL | Status: AC
  Administered 2018-11-15: 324 mg via ORAL
  Filled 2018-11-15: qty 4

## 2018-11-15 MED ORDER — SODIUM CHLORIDE 0.9 % IV SOLN
INTRAVENOUS | Status: DC
  Administered 2018-11-15: 1000 mL via INTRAVENOUS

## 2018-11-15 MED ORDER — FLUTICASONE PROPIONATE 50 MCG/ACT NA SUSP
2.0000 | Freq: Every day | NASAL | Status: DC | PRN
  Filled 2018-11-15: qty 16

## 2018-11-15 MED ORDER — PANTOPRAZOLE SODIUM 40 MG PO TBEC
40.0000 mg | DELAYED_RELEASE_TABLET | Freq: Every day | ORAL | Status: DC
  Filled 2018-11-15: qty 1

## 2018-11-15 MED ORDER — ZOLPIDEM TARTRATE 5 MG PO TABS: 5.0000 mg | ORAL_TABLET | Freq: Every evening | ORAL | Status: DC | PRN

## 2018-11-15 MED ORDER — ENOXAPARIN SODIUM 40 MG/0.4ML ~~LOC~~ SOLN
40.0000 mg | SUBCUTANEOUS | Status: DC
  Administered 2018-11-15: 40 mg via SUBCUTANEOUS
  Filled 2018-11-15 (×2): qty 0.4

## 2018-11-15 MED ORDER — LORATADINE 10 MG PO TABS
10.0000 mg | ORAL_TABLET | Freq: Every day | ORAL | Status: DC
  Filled 2018-11-15: qty 1

## 2018-11-15 NOTE — ED Notes (Signed)
Dr. Tammy Sours at bedside

## 2018-11-15 NOTE — H&P (Signed)
History and Physical    Kim Chavez QZR:[id number] DOB: [birth year] DOA: 11/15/2018  PCP: Kim Redwood, MD Patient coming from: Home  Chief Complaint: Chest pain  HPI: Kim Chavez is a 72 y.o. female with medical history significant of anxiety, depression, fibromyalgia, GERD, hypertension, hyperlipidemia, hypothyroidism presenting to the hospital for evaluation of chest pain.  Patient states a week ago she experienced heart palpitations while getting ready for bed.  The entire episode lasted about 10 minutes.  Did not have any associated chest pain, shortness of breath, or dizziness.  She did not lose consciousness.  States again today she was at [facility] and experienced heart palpitations and slight left-sided "chest ache."  No associated shortness of breath, diaphoresis, or nausea.  Symptoms persisted for about 3 hours until she was in the ED.  At present, states she is still having some chest ache just 2-3 out of 10 in intensity.  States her father had a heart attack at the age of 37.  She previously smoked cigarettes for about 5 years and quit in 1976.  States she had a stress test done 20 years ago due to heart palpitations and was told that the result was normal.  ED Course: Hemodynamically stable.  I-STAT troponin negative and EKG not suggestive of ACS.  No leukocytosis.  Potassium 3.1.  Creatinine 1.3, baseline was 0.8-1.0 approximately a year ago.  Chest x-ray showing no active cardiopulmonary disease.  Aspirin, morphine, and nitroglycerin ordered in the ED.  Review of Systems: As per HPI otherwise 10 point review of systems negative.  Past Medical History:  Diagnosis Date  . Anxiety   . Breast cancer ([place]) 2011   BILATERAL BREAST REMOVED  . Depression   . Fibromyalgia   . GERD (gastroesophageal reflux disease)   . Hypertension   . Hypothyroidism   . Migraine    MIGRAINES  . Overactive bladder   . Pneumonia    several times  . RUQ pain   . Sleep apnea    uses  c-pap machine  . Thyroid disease     Past Surgical History:  Procedure Laterality Date  . ANTERIOR AND POSTERIOR REPAIR N/A 01/05/2017   Procedure: ANTERIOR (CYSTOCELE) AND POSTERIOR REPAIR (RECTOCELE);  Surgeon: Paula Compton, MD;  Location: [facility];  Service: Gynecology;  Laterality: N/A;  . APPENDECTOMY    . CHOLECYSTECTOMY N/A 09/16/2015   Procedure: CHOLECYSTECTOMY;  Surgeon: [name], MD;  Location: [facility];  Service: General;  Laterality: N/A;  . deviated septum surgery in 1980's    . LAPAROSCOPIC LIVER CYST UNROOFING N/A 09/16/2015   Procedure: LAPAROSCOPIC LIVER CYST UNROOFING X'S 2;  Surgeon: [name], MD;  Location: [facility];  Service: General;  Laterality: N/A;  . LAPAROSCOPIC VAGINAL HYSTERECTOMY WITH SALPINGO OOPHORECTOMY Bilateral 01/05/2017   Procedure: LAPAROSCOPIC ASSISTED VAGINAL HYSTERECTOMY WITH SALPINGO OOPHORECTOMY;  Surgeon: Paula Compton, MD;  Location: [facility];  Service: Gynecology;  Laterality: Bilateral;  . SIMPLE MASTECTOMY W/ SENTINEL NODE BIOPSY Bilateral 2011  . TUBAL LIGATION       reports that she quit smoking about 43 years ago. She has never used smokeless tobacco. She reports that she does not drink alcohol or use drugs.  Allergies  Allergen Reactions  . Aspirin Other (See Comments)    Almost caused ulcers Almost caused ulcers  . Erythromycin Nausea And Vomiting  . Ibuprofen Other (See Comments)    Has Crohn's and was instructed to NOT take Has Crohn's and was instructed to NOT take  .  Tape Rash    Plastic   . Codeine Nausea And Vomiting    'Deathly sick on stomach"   . Other Diarrhea    Cramping also- Nothing with seeds/has Crohn's!!  . Penicillins Swelling    Arms became swollen Has patient had a PCN reaction causing immediate rash, facial/tongue/throat swelling, SOB or lightheadedness with hypotension: Yes Has patient had a PCN reaction causing severe rash involving mucus membranes or skin necrosis: No Has patient had a PCN  reaction that required hospitalization: No Has patient had a PCN reaction occurring within the last 10 years: No If all of the above answers are "NO", then may proceed with Cephalosporin use.   . Neosporin [Neomycin-Bacitracin Zn-Polymyx] Itching, Rash and Other (See Comments)    Turns red around the site and itches like crazy for about two weeks.     Family History  Problem Relation Age of Onset  . Breast cancer Mother   . CAD Father     Prior to Admission medications   Medication Sig Start Date End Date Taking? Authorizing Provider  calcium carbonate (TUMS EX) 750 MG chewable tablet Chew 1 tablet by mouth daily.    [provider]  Cholecalciferol (VITAMIN D3) 5000 units CAPS Take 5,000 Units by mouth daily.    [provider]  Coenzyme Q10 (CO Q-10) 100 MG CAPS Take 200 mg by mouth every morning.     [provider]  diazepam (VALIUM) 5 MG tablet Take 1 tablet (5 mg total) by mouth every 6 (six) hours as needed for anxiety. Patient not taking: Reported on 08/21/2018 02/16/17   Dohmeier, Asencion Partridge, MD  DULoxetine (CYMBALTA) 60 MG capsule Take 60 mg by mouth at bedtime.  08/18/17   [provider]  fluticasone (FLONASE) 50 MCG/ACT nasal spray Place 2 sprays into both nostrils daily as needed for allergies.     [provider]  HYDROcodone-acetaminophen (NORCO/VICODIN) 5-325 MG tablet Take 1 tablet by mouth every six hours as needed for severe headache pain 08/19/17   [provider]  levothyroxine (SYNTHROID, LEVOTHROID) 50 MCG tablet Take 50 mcg by mouth daily before breakfast.    [provider]  Multiple Vitamins-Minerals (MULTI ADULT GUMMIES) CHEW Chew 2 tablets by mouth daily.    [provider]  NON FORMULARY CPAP: At bedtime (nightly)    [provider]  omeprazole (PRILOSEC) 20 MG capsule Take 40 mg by mouth at bedtime.     [provider]  Probiotic Product (PROBIOTIC DAILY PO) Take 2 tablets by mouth  every morning.     [provider]  simvastatin (ZOCOR) 20 MG tablet Take 20 mg by mouth at bedtime.     [provider]  temazepam (RESTORIL) 15 MG capsule Take 15 mg at bedtime as needed by mouth for sleep.    [provider]  topiramate (TOPAMAX) 50 MG tablet Take 50 mg by mouth 2 (two) times daily.    [provider]  zolpidem (AMBIEN) 10 MG tablet Take 10 mg by mouth at bedtime as needed for sleep.    [provider]    Physical Exam: Vitals:   11/15/18 1815 11/15/18 1830 11/15/18 1845 11/15/18 1915  BP: 129/69 (!) 153/55 139/64 126/80  Pulse: 82 89 88 86  Resp: 11 12 15 18   SpO2: 96% 97% 96% 96%    Physical Exam  Constitutional: She is oriented to person, place, and time. She appears well-developed and well-nourished. No distress.  HENT:  Head: Normocephalic.  Mouth/Throat: Oropharynx is clear and moist.  Eyes: Right eye exhibits no discharge. Left eye exhibits no discharge.  Neck: Neck supple.  Cardiovascular: Normal rate, regular rhythm and intact distal pulses.  Pulmonary/Chest: Effort normal and breath sounds normal. No respiratory distress. She has no wheezes. She has no rales.  Abdominal: Soft. Bowel sounds are normal. She exhibits no distension. There is no tenderness. There is no guarding.  Musculoskeletal: She exhibits no edema.  Neurological: She is alert and oriented to person, place, and time.  Skin: Skin is warm and dry. She is not diaphoretic.  Psychiatric: She has a normal mood and affect. Her behavior is normal.     Labs on Admission: I have personally reviewed following labs and imaging studies  CBC: Recent Labs  Lab 11/15/18 1643  WBC 8.9  HGB 15.3*  HCT 47.3*  MCV 81.4  PLT 025   Basic Metabolic Panel: Recent Labs  Lab 11/15/18 1643  NA 141  K 3.1*  CL 100  CO2 25  GLUCOSE 100*  BUN 13  CREATININE 1.36*  CALCIUM 10.0   GFR: CrCl cannot be calculated (Unknown ideal weight.). Liver Function  Tests: No results for input(s): AST, ALT, ALKPHOS, BILITOT, PROT, ALBUMIN in the last 168 hours. No results for input(s): LIPASE, AMYLASE in the last 168 hours. No results for input(s): AMMONIA in the last 168 hours. Coagulation Profile: No results for input(s): INR, PROTIME in the last 168 hours. Cardiac Enzymes: No results for input(s): CKTOTAL, CKMB, CKMBINDEX, TROPONINI in the last 168 hours. BNP (last 3 results) No results for input(s): PROBNP in the last 8760 hours. HbA1C: No results for input(s): HGBA1C in the last 72 hours. CBG: No results for input(s): GLUCAP in the last 168 hours. Lipid Profile: No results for input(s): CHOL, HDL, LDLCALC, TRIG, CHOLHDL, LDLDIRECT in the last 72 hours. Thyroid Function Tests: No results for input(s): TSH, T4TOTAL, FREET4, T3FREE, THYROIDAB in the last 72 hours. Anemia Panel: No results for input(s): VITAMINB12, FOLATE, FERRITIN, TIBC, IRON, RETICCTPCT in the last 72 hours. Urine analysis:    Component Value Date/Time   COLORURINE YELLOW 12/02/2017 2038   APPEARANCEUR CLEAR 12/02/2017 2038   LABSPEC 1.008 12/02/2017 2038   PHURINE 5.0 12/02/2017 2038   GLUCOSEU NEGATIVE 12/02/2017 2038   HGBUR NEGATIVE 12/02/2017 2038   BILIRUBINUR NEGATIVE 12/02/2017 2038   KETONESUR NEGATIVE 12/02/2017 2038   PROTEINUR NEGATIVE 12/02/2017 2038   UROBILINOGEN 0.2 09/12/2015 0930   NITRITE NEGATIVE 12/02/2017 2038   LEUKOCYTESUR SMALL (A) 12/02/2017 2038    Radiological Exams on Admission: Dg Chest 2 View  Result Date: 11/15/2018 CLINICAL DATA:  Chest pain, palpitations, dizziness for 2-3 days. EXAM: CHEST - 2 VIEW COMPARISON:  Chest x-ray dated 10/25/2017. FINDINGS: Heart size and mediastinal contours are stable. Atherosclerotic changes noted at the aortic arch. Lungs are clear. No pleural effusion or pneumothorax seen. No acute or suspicious osseous finding. Mild compression deformity of a lower thoracic vertebral body, unchanged. IMPRESSION: 1. No  active cardiopulmonary disease. No evidence of pneumonia or pulmonary edema. 2. Aortic atherosclerosis. Electronically Signed   By: Franki Cabot M.D.   On: 11/15/2018 18:05    EKG: Independently reviewed.  Sinus rhythm (heart rate 100), nonspecific T wave abnormality.  Assessment/Plan Principal Problem:   Chest pain Active Problems:   Hypothyroidism   HTN (hypertension)   HLD (hyperlipidemia)   OSA (obstructive sleep apnea)   Heart palpitations   Hypokalemia   Elevated serum creatinine   Fibromyalgia   GERD (  gastroesophageal reflux disease)   Insomnia   Chest pain, heart palpitations Hemodynamically stable.  I-STAT troponin negative and EKG not suggestive of ACS.  Chest x-ray showing no active cardiopulmonary disease.  Patient does have risk factors for coronary artery disease including age, hypertension, hyperlipidemia, prior smoking history, and family history of coronary artery disease.  She appears comfortable on exam. Reports having 2-3 out of intensity chest discomfort.  Not tachycardic-heart rate in the 80s to 90s on cardiac monitor. -Repeat EKG -Cycle troponin -Echocardiogram -Aspirin -Patient just now received a dose of nitroglycerin and morphine which was ordered in the ED.  Will reassess to see if that helps alleviate her chest discomfort.   -Sublingual nitroglycerin as needed -She has a history of hypothyroidism and increased currently on levothyroxine.  Will check TSH level to make sure that excess thyroid hormone is not contributing to her heart palpitations. -Check A1c, lipid panel  Hypokalemia Potassium 3.1 in the setting of home diuretic use. -Replete potassium -Check magnesium level -BMP in a.m.  Elevated creatinine BUN 13.  Creatinine 1.3, baseline was 0.8-1.0 approximately a year ago.   -IV fluid hydration -Repeat BMP in a.m.  Hypertension -Currently normotensive.  Hold home hydrochlorothiazide due to elevated creatinine. -Hydralazine  PRN  Hyperlipidemia -Check lipid panel -Continue home simvastatin  Fibromyalgia -Tylenol PRN  Hypothyroidism -Continue home Synthroid -Check TSH level  GERD -Continue PPI  Insomnia -Continue zolpidem as needed  OSA -CPAP at night  DVT prophylaxis: Lovenox Code Status: Full code.  Discussed with patient. Family Communication: Husband at bedside Disposition Plan: Anticipate discharge to home in 1 to 2 days Consults called: None  Admission status: Observation   Kim Leff MD [facility] Pager [phone]  If 7PM-7AM, please contact night-coverage [email or web address] Password [facility]  11/15/2018, 7:38 PM

## 2018-11-15 NOTE — ED Triage Notes (Signed)
Pt presents to ED for assessment of left sided chest ache x 3 days with palpitations, heart racing, Heart rates at home in the 90s.  Denies n/v.  C/o generalized weakness, fatigue, malaise, light-headedness, left arm pain,

## 2018-11-15 NOTE — ED Notes (Signed)
Admitting at bedside 

## 2018-11-15 NOTE — ED Notes (Signed)
Pt. On purewick

## 2018-11-15 NOTE — ED Notes (Signed)
Patient transported to X-ray 

## 2018-11-16 ENCOUNTER — Ambulatory Visit (HOSPITAL_BASED_OUTPATIENT_CLINIC_OR_DEPARTMENT_OTHER): Payer: Medicare Other

## 2018-11-16 ENCOUNTER — Other Ambulatory Visit: Payer: Self-pay

## 2018-11-16 DIAGNOSIS — R079 Chest pain, unspecified: Secondary | ICD-10-CM | POA: Diagnosis not present

## 2018-11-16 DIAGNOSIS — R9431 Abnormal electrocardiogram [ECG] [EKG]: Secondary | ICD-10-CM | POA: Diagnosis not present

## 2018-11-16 DIAGNOSIS — R002 Palpitations: Secondary | ICD-10-CM

## 2018-11-16 DIAGNOSIS — G4733 Obstructive sleep apnea (adult) (pediatric): Secondary | ICD-10-CM | POA: Diagnosis not present

## 2018-11-16 DIAGNOSIS — E039 Hypothyroidism, unspecified: Secondary | ICD-10-CM | POA: Diagnosis not present

## 2018-11-16 DIAGNOSIS — K219 Gastro-esophageal reflux disease without esophagitis: Secondary | ICD-10-CM | POA: Diagnosis not present

## 2018-11-16 DIAGNOSIS — I361 Nonrheumatic tricuspid (valve) insufficiency: Secondary | ICD-10-CM | POA: Diagnosis not present

## 2018-11-16 DIAGNOSIS — M797 Fibromyalgia: Secondary | ICD-10-CM

## 2018-11-16 DIAGNOSIS — I10 Essential (primary) hypertension: Secondary | ICD-10-CM | POA: Diagnosis not present

## 2018-11-16 DIAGNOSIS — E876 Hypokalemia: Secondary | ICD-10-CM

## 2018-11-16 DIAGNOSIS — E785 Hyperlipidemia, unspecified: Secondary | ICD-10-CM | POA: Diagnosis not present

## 2018-11-16 DIAGNOSIS — R7989 Other specified abnormal findings of blood chemistry: Secondary | ICD-10-CM | POA: Diagnosis not present

## 2018-11-16 DIAGNOSIS — R0789 Other chest pain: Secondary | ICD-10-CM | POA: Diagnosis not present

## 2018-11-16 DIAGNOSIS — Z87891 Personal history of nicotine dependence: Secondary | ICD-10-CM | POA: Diagnosis not present

## 2018-11-16 DIAGNOSIS — G4700 Insomnia, unspecified: Secondary | ICD-10-CM | POA: Diagnosis not present

## 2018-11-16 LAB — ECHOCARDIOGRAM COMPLETE
Height: 63 in
Weight: 2614.4 oz

## 2018-11-16 LAB — LIPID PANEL
Cholesterol: 164 mg/dL (ref 0–200)
HDL: 41 mg/dL (ref >40)
LDL CALC: 100 mg/dL — AB (ref 0–99)
Total CHOL/HDL Ratio: 4 RATIO
Triglycerides: 113 mg/dL (ref <150)
VLDL: 23 mg/dL (ref 0–40)

## 2018-11-16 LAB — CBC
HCT: 40.6 % (ref 36.0–46.0)
Hemoglobin: 13 g/dL (ref 12.0–15.0)
MCH: 26.2 pg (ref 26.0–34.0)
MCHC: 32 g/dL (ref 30.0–36.0)
MCV: 81.7 fL (ref 80.0–100.0)
Platelets: 280 10*3/uL (ref 150–400)
RBC: 4.97 MIL/uL (ref 3.87–5.11)
RDW: 13.2 % (ref 11.5–15.5)
WBC: 4.9 10*3/uL (ref 4.0–10.5)
nRBC: 0 % (ref 0.0–0.2)

## 2018-11-16 LAB — BASIC METABOLIC PANEL
Anion gap: 9 (ref 5–15)
BUN: 12 mg/dL (ref 8–23)
CO2: 25 mmol/L (ref 22–32)
Calcium: 8.9 mg/dL (ref 8.9–10.3)
Chloride: 107 mmol/L (ref 98–111)
Creatinine, Ser: 1.03 mg/dL — ABNORMAL HIGH (ref 0.44–1.00)
GFR calc Af Amer: >60 mL/min (ref >60)
GFR calc non Af Amer: 54 mL/min — ABNORMAL LOW (ref >60)
Glucose, Bld: 113 mg/dL — ABNORMAL HIGH (ref 70–99)
Potassium: 3.6 mmol/L (ref 3.5–5.1)
Sodium: 141 mmol/L (ref 135–145)

## 2018-11-16 LAB — TROPONIN I
Troponin I: <0.03 ng/mL (ref <0.03)
Troponin I: <0.03 ng/mL (ref <0.03)

## 2018-11-16 LAB — HEMOGLOBIN A1C
Hgb A1c MFr Bld: 5.6 % (ref 4.8–5.6)
Mean Plasma Glucose: 114.02 mg/dL

## 2018-11-16 NOTE — Progress Notes (Signed)
Discharged home by wheelchair, discharged instructions given to pt. Belongings taken home.

## 2018-11-16 NOTE — Discharge Summary (Signed)
Physician Discharge Summary  Kim Chavez GYB:[id number] DOB: 1946-06-10 DOA: 11/15/2018  PCP: Marton Redwood, MD  Admit date: 11/15/2018 Discharge date: 11/16/2018  Admitted From: home Discharge disposition: home   Recommendations for Outpatient Follow-Up:   1. Outpatient stress test and event monitor 2. BMP 1 week   Discharge Diagnosis:   Principal Problem:   Chest pain Active Problems:   Hypothyroidism   HTN (hypertension)   HLD (hyperlipidemia)   OSA (obstructive sleep apnea)   Heart palpitations   Hypokalemia   Elevated serum creatinine   Fibromyalgia   GERD (gastroesophageal reflux disease)   Insomnia    Discharge Condition: Improved.  Diet recommendation: Low sodium, heart healthy  Wound care: None.  Code status: Full.   History of Present Illness:   Kim Chavez is a 72 y.o. female with medical history significant of anxiety, depression, fibromyalgia, GERD, hypertension, hyperlipidemia, hypothyroidism presenting to the hospital for evaluation of chest pain.  Patient states a week ago she experienced heart palpitations while getting ready for bed.  The entire episode lasted about 10 minutes.  Did not have any associated chest pain, shortness of breath, or dizziness.  She did not lose consciousness.  States again today she was at [facility] and experienced heart palpitations and slight left-sided "chest ache."  No associated shortness of breath, diaphoresis, or nausea.  Symptoms persisted for about 3 hours until she was in the ED.  At present, states she is still having some chest ache just 2-3 out of 10 in intensity.  States her father had a heart attack at the age of 54.  She previously smoked cigarettes for about 5 years and quit in 1976.  States she had a stress test done 20 years ago due to heart palpitations and was told that the result was normal   Hospital Course by Problem:   Chest pain, heart palpitations -outpatient stress test and  event monitor -Appreciate Dr. Irven Shelling note  Hypokalemia Potassium 3.1 in the setting of home diuretic use. -Replete potassium  Hypertension -monitor as an outpateint  Hyperlipidemia -Continue home simvastatin  Fibromyalgia -Tylenol PRN  Hypothyroidism -Continue home Synthroid -Check TSH level  GERD -Continue PPI  Insomnia -Continue zolpidem as needed  OSA -CPAP at night    Medical Consultants:   Cards- [facility]   Discharge Exam:   Vitals:   11/16/18 0800 11/16/18 1200  BP:    Pulse:    Resp:    Temp: 98.3 F (36.8 C) 98.1 F (36.7 C)  SpO2:     Vitals:   11/16/18 0615 11/16/18 0630 11/16/18 0800 11/16/18 1200  BP: 115/80 102/62    Pulse: 92 100    Resp: 10 (!) 9    Temp:   98.3 F (36.8 C) 98.1 F (36.7 C)  TempSrc:   Oral Oral  SpO2: 98% 97%    Weight:      Height:        General exam: Anxious appearing   The results of significant diagnostics from this hospitalization (including imaging, microbiology, ancillary and laboratory) are listed below for reference.     Procedures and Diagnostic Studies:   Dg Chest 2 View  Result Date: 11/15/2018 CLINICAL DATA:  Chest pain, palpitations, dizziness for 2-3 days. EXAM: CHEST - 2 VIEW COMPARISON:  Chest x-ray dated 10/25/2017. FINDINGS: Heart size and mediastinal contours are stable. Atherosclerotic changes noted at the aortic arch. Lungs are clear. No pleural effusion or pneumothorax seen. No acute or suspicious  osseous finding. Mild compression deformity of a lower thoracic vertebral body, unchanged. IMPRESSION: 1. No active cardiopulmonary disease. No evidence of pneumonia or pulmonary edema. 2. Aortic atherosclerosis. Electronically Signed   By: Franki Cabot M.D.   On: 11/15/2018 18:05     Labs:   Basic Metabolic Panel: Recent Labs  Lab 11/15/18 1643 11/15/18 1939 11/16/18 0756  NA 141  --  141  K 3.1*  --  3.6  CL 100  --  107  CO2 25  --  25  GLUCOSE 100*  --  113*  BUN 13  --   12  CREATININE 1.36*  --  1.03*  CALCIUM 10.0  --  8.9  MG  --  2.3  --    GFR Estimated Creatinine Clearance: 47.6 mL/min (A) (by C-G formula based on SCr of 1.03 mg/dL (H)). Liver Function Tests: No results for input(s): AST, ALT, ALKPHOS, BILITOT, PROT, ALBUMIN in the last 168 hours. No results for input(s): LIPASE, AMYLASE in the last 168 hours. No results for input(s): AMMONIA in the last 168 hours. Coagulation profile No results for input(s): INR, PROTIME in the last 168 hours.  CBC: Recent Labs  Lab 11/15/18 1643 11/16/18 0756  WBC 8.9 4.9  HGB 15.3* 13.0  HCT 47.3* 40.6  MCV 81.4 81.7  PLT 320 280   Cardiac Enzymes: Recent Labs  Lab 11/15/18 1939 11/16/18 0218 11/16/18 0756  TROPONINI <0.03 <0.03 <0.03   BNP: Invalid input(s): POCBNP CBG: No results for input(s): GLUCAP in the last 168 hours. D-Dimer No results for input(s): DDIMER in the last 72 hours. Hgb A1c Recent Labs    11/16/18 0218  HGBA1C 5.6   Lipid Profile Recent Labs    11/16/18 0218  CHOL 164  HDL 41  LDLCALC 100*  TRIG 113  CHOLHDL 4.0   Thyroid function studies Recent Labs    11/15/18 1939  TSH 1.253   Anemia work up No results for input(s): VITAMINB12, FOLATE, FERRITIN, TIBC, IRON, RETICCTPCT in the last 72 hours. Microbiology Recent Results (from the past 240 hour(s))  MRSA PCR Screening     Status: None   Collection Time: 11/15/18  8:18 PM  Result Value Ref Range Status   MRSA by PCR NEGATIVE NEGATIVE Final    Comment:        The GeneXpert MRSA Assay (FDA approved for NASAL specimens only), is one component of a comprehensive MRSA colonization surveillance program. It is not intended to diagnose MRSA infection nor to guide or monitor treatment for MRSA infections. Performed at [facility], [place] [address], [place], [place] [address]      Discharge Instructions:   Discharge Instructions    Diet - low sodium heart healthy   Complete by:  As  directed    Discharge instructions   Complete by:  As directed    30 day event monitor and stress test with Dr. Einar Gip   Increase activity slowly   Complete by:  As directed      Allergies as of 11/16/2018      Reactions   Aspirin Other (See Comments)   Almost caused ulcers Almost caused ulcers   Erythromycin Nausea And Vomiting   Ibuprofen Other (See Comments)   Has Crohn's and was instructed to NOT take Has Crohn's and was instructed to NOT take   Tape Rash   Plastic    Codeine Nausea And Vomiting   'Deathly sick on stomach"    Other Diarrhea  Cramping also- Nothing with seeds/has Crohn's!!   Penicillins Swelling   Arms became swollen Has patient had a PCN reaction causing immediate rash, facial/tongue/throat swelling, SOB or lightheadedness with hypotension: Yes Has patient had a PCN reaction causing severe rash involving mucus membranes or skin necrosis: No Has patient had a PCN reaction that required hospitalization: No Has patient had a PCN reaction occurring within the last 10 years: No If all of the above answers are "NO", then may proceed with Cephalosporin use.   Neosporin [neomycin-bacitracin Zn-polymyx] Itching, Rash, Other (See Comments)   Turns red around the site and itches like crazy for about two weeks.       Medication List    STOP taking these medications   diazepam 5 MG tablet Commonly known as:  VALIUM     TAKE these medications   cetirizine 10 MG tablet Commonly known as:  ZYRTEC Take 10 mg by mouth daily.   Co Q-10 100 MG Caps Take 200 mg by mouth every morning.   fluticasone 50 MCG/ACT nasal spray Commonly known as:  FLONASE Place 2 sprays into both nostrils daily as needed for allergies.   hydrochlorothiazide 12.5 MG tablet Commonly known as:  HYDRODIURIL Take 12.5 mg by mouth daily.   HYDROcodone-acetaminophen 5-325 MG tablet Commonly known as:  NORCO/VICODIN Take 1 tablet by mouth every 6 (six) hours as needed for moderate pain or  severe pain.   levothyroxine 50 MCG tablet Commonly known as:  SYNTHROID, LEVOTHROID Take 50 mcg by mouth daily before breakfast.   NON FORMULARY CPAP: At bedtime (nightly)   omeprazole 20 MG capsule Commonly known as:  PRILOSEC Take 40 mg by mouth at bedtime.   PROBIOTIC DAILY PO Take 4 tablets by mouth every morning. gummie   simvastatin 20 MG tablet Commonly known as:  ZOCOR Take 20 mg by mouth at bedtime.   Vitamin D3 125 MCG (5000 UT) Caps Take 5,000 Units by mouth daily.   zolpidem 10 MG tablet Commonly known as:  AMBIEN Take 10 mg by mouth at bedtime.      Follow-up Information    Adrian Prows, MD Follow up.   Specialty:  Cardiology Why:  Go to the office today for placing an Event monitor to monitor your heart. Stress test tomorrow and they will give you instructions.  Contact information: [place] [address] [phone]        Marton Redwood, MD Follow up in 1 week(s).   Specialty:  Internal Medicine Contact information: [address] [place] [address] [phone]            Time coordinating discharge: 25 min  Signed:  Geradine Girt DO  [facility] 11/16/2018, 2:18 PM

## 2018-11-16 NOTE — Progress Notes (Signed)
Pt having chest ache 3/10 bp 151/123, placed on 4lnc, gave 1 sl ntg.  Discomfort decreased to 2/10 gave another sl NTG. Obtained EKG T wave abnormal ( not new)  Prolonged QT   bp 109/71, Discomfort decreased 1/10.  Pt in no discomfort.  Md made aware. Will continue to monitor. Saunders Revel T

## 2018-11-16 NOTE — Progress Notes (Signed)
Refused to take am meds, claimed to take only bp meds where in no orders given. MD aware who claimed that cardiologist coming to see her.

## 2018-11-16 NOTE — Progress Notes (Signed)
Progress Note    Kim Chavez  AYT:[id number] DOB: 01-21-1946  DOA: 11/15/2018 PCP: Marton Redwood, MD    Brief Narrative:    Medical records reviewed and are as summarized below:  Kim Chavez is an 72 y.o. female with medical history significant of anxiety, depression, fibromyalgia, GERD, hypertension, hyperlipidemia, hypothyroidism presenting to the hospital for evaluation of chest pain.  Patient states a week ago she experienced heart palpitations while getting ready for bed.  The entire episode lasted about 10 minutes.  Did not have any associated chest pain, shortness of breath, or dizziness.  She did not lose consciousness.  States again today she was at [facility] and experienced heart palpitations and slight left-sided "chest ache."  No associated shortness of breath, diaphoresis, or nausea.  Symptoms persisted for about 3 hours until she was in the ED.  At present, states she is still having some chest ache just 2-3 out of 10 in intensity.  States her father had a heart attack at the age of 27.  She previously smoked cigarettes for about 5 years and quit in 1976.  States she had a stress test done 20 years ago due to heart palpitations and was told that the result was normal.  Assessment/Plan:   Principal Problem:   Chest pain Active Problems:   Hypothyroidism   HTN (hypertension)   HLD (hyperlipidemia)   OSA (obstructive sleep apnea)   Heart palpitations   Hypokalemia   Elevated serum creatinine   Fibromyalgia   GERD (gastroesophageal reflux disease)   Insomnia  Chest pain, heart palpitations -risk factors for coronary artery disease including age, hypertension, hyperlipidemia, prior smoking history, and family history of coronary artery disease -Echocardiogram -Aspirin   -Sublingual nitroglycerin as needed -cardiology consult: Dr. Einar Gip -? Need for event monitor to r/o a fib  Hypokalemia -replete  Elevated creatinine -resolved -HCTZ  held  Hypertension -Currently normotensive.  Hold home hydrochlorothiazide due to elevated creatinine. -Hydralazine PRN  Hyperlipidemia -LDL 100 -Continue home statin (77m)- may need adjustments for better risk factor modifications  Fibromyalgia -Tylenol PRN  Hypothyroidism -Continue home Synthroid -TSH normal  GERD -Continue PPI  Insomnia -Continue zolpidem as needed  OSA -CPAP at night   Family Communication/Anticipated D/C date and plan/Code Status   DVT prophylaxis: Lovenox ordered. Code Status: Full Code.  Family Communication:  Disposition Plan: pending cardiac work up   Medical Consultants:    [facility]    Subjective:   Has episode of chest tightness this AM -has been having feelings of heart racing at home  Objective:    Vitals:   11/16/18 0602 11/16/18 0615 11/16/18 0630 11/16/18 0800  BP: (!) 151/123 115/80 102/62   Pulse:  92 100   Resp: 15 10 (!) 9   Temp:    98.3 F (36.8 C)  TempSrc:    Oral  SpO2: (!) 65% 98% 97%   Weight:      Height:        Intake/Output Summary (Last 24 hours) at 11/16/2018 0936 Last data filed at 11/16/2018 0236 Gross per 24 hour  Intake 1029.88 ml  Output 750 ml  Net 279.88 ml   Filed Weights   11/15/18 2000  Weight: 74.1 kg    Exam: In bed, NAD Lungs clear, no wheezing, no increased work of breathing rrr No LE edema A+Ox3 Normal mood and affect  Data Reviewed:   I have personally reviewed following labs and imaging studies:  Labs: Labs show the following:  Basic Metabolic Panel: Recent Labs  Lab 11/15/18 1643 11/15/18 1939 11/16/18 0756  NA 141  --  141  K 3.1*  --  3.6  CL 100  --  107  CO2 25  --  25  GLUCOSE 100*  --  113*  BUN 13  --  12  CREATININE 1.36*  --  1.03*  CALCIUM 10.0  --  8.9  MG  --  2.3  --    GFR Estimated Creatinine Clearance: 47.6 mL/min (A) (by C-G formula based on SCr of 1.03 mg/dL (H)). Liver Function Tests: No results for input(s):  AST, ALT, ALKPHOS, BILITOT, PROT, ALBUMIN in the last 168 hours. No results for input(s): LIPASE, AMYLASE in the last 168 hours. No results for input(s): AMMONIA in the last 168 hours. Coagulation profile No results for input(s): INR, PROTIME in the last 168 hours.  CBC: Recent Labs  Lab 11/15/18 1643 11/16/18 0756  WBC 8.9 4.9  HGB 15.3* 13.0  HCT 47.3* 40.6  MCV 81.4 81.7  PLT 320 280   Cardiac Enzymes: Recent Labs  Lab 11/15/18 1939 11/16/18 0218 11/16/18 0756  TROPONINI <0.03 <0.03 <0.03   BNP (last 3 results) No results for input(s): PROBNP in the last 8760 hours. CBG: No results for input(s): GLUCAP in the last 168 hours. D-Dimer: No results for input(s): DDIMER in the last 72 hours. Hgb A1c: Recent Labs    11/16/18 0218  HGBA1C 5.6   Lipid Profile: Recent Labs    11/16/18 0218  CHOL 164  HDL 41  LDLCALC 100*  TRIG 113  CHOLHDL 4.0   Thyroid function studies: Recent Labs    11/15/18 1939  TSH 1.253   Anemia work up: No results for input(s): VITAMINB12, FOLATE, FERRITIN, TIBC, IRON, RETICCTPCT in the last 72 hours. Sepsis Labs: Recent Labs  Lab 11/15/18 1643 11/16/18 0756  WBC 8.9 4.9    Microbiology Recent Results (from the past 240 hour(s))  MRSA PCR Screening     Status: None   Collection Time: 11/15/18  8:18 PM  Result Value Ref Range Status   MRSA by PCR NEGATIVE NEGATIVE Final    Comment:        The GeneXpert MRSA Assay (FDA approved for NASAL specimens only), is one component of a comprehensive MRSA colonization surveillance program. It is not intended to diagnose MRSA infection nor to guide or monitor treatment for MRSA infections. Performed at [facility], [place] [address], [place], [place] [address]     Procedures and diagnostic studies:  Dg Chest 2 View  Result Date: 11/15/2018 CLINICAL DATA:  Chest pain, palpitations, dizziness for 2-3 days. EXAM: CHEST - 2 VIEW COMPARISON:  Chest x-ray dated 10/25/2017.  FINDINGS: Heart size and mediastinal contours are stable. Atherosclerotic changes noted at the aortic arch. Lungs are clear. No pleural effusion or pneumothorax seen. No acute or suspicious osseous finding. Mild compression deformity of a lower thoracic vertebral body, unchanged. IMPRESSION: 1. No active cardiopulmonary disease. No evidence of pneumonia or pulmonary edema. 2. Aortic atherosclerosis. Electronically Signed   By: Franki Cabot M.D.   On: 11/15/2018 18:05    Medications:   . cholecalciferol  5,000 Units Oral Daily  . enoxaparin (LOVENOX) injection  40 mg Subcutaneous Q24H  . levothyroxine  50 mcg Oral QAC breakfast  . loratadine  10 mg Oral Daily  . pantoprazole  40 mg Oral Daily  . simvastatin  20 mg Oral QHS   Continuous Infusions:   LOS:  0 days   Geradine Girt  [facility]   *Please refer to [email or web address], password [email or web address] to get updated schedule on who will round on this patient, as hospitalists switch teams weekly. If 7PM-7AM, please contact night-coverage at [email or web address], password [email or web address] for any overnight needs.  11/16/2018, 9:36 AM

## 2018-11-16 NOTE — Progress Notes (Signed)
SATURATION QUALIFICATIONS: (This note is used to comply with regulatory documentation for home oxygen)  Patient Saturations on Room Air at Rest = 97  Patient Saturations on Room Air while Ambulating = 92%  Patient Saturations on 97 Liters of oxygen while Ambulating  92  Please briefly explain why patient needs home oxygen .

## 2018-11-16 NOTE — H&P (Signed)
CARDIOLOGY CONSULT NOTE  Patient ID: Kim Chavez MRN: [id number] DOB/AGE: August 20, 1946 72 y.o.  Admit date: 11/15/2018 Referring Physician  Eulogio Bear, MD Primary Physician:  Marton Redwood, MD Reason for Consultation  Chest pain and palpitations  HPI: Kim Chavez  is a 72 y.o. female  With hypertension, hyperlipidemia, mild obesity, sleep apnea on CPAP, who has been having palpitations ongoing for many years but the used to be short lasting.  About 10 days ago she got up from her bed and suddenly felt rapid heartbeat lasting for about 8 to 10 minutes, also felt dizzy and had some mild chest discomfort.  Episode subsided spontaneously.  Last night she was at [facility] when she was shopping, again had palpitations and mild chest discomfort and was brought to the emergency room.  She has been ruled out for myocardial infarction.  She has not had any episodes of chest pain while she is in the emergency room however she stated that she had some palpitations again around 6 AM this morning and had mild chest discomfort.  She was concerned about it.  EKG did not reveal any acute abnormality.  I was consulted to evaluate the patient.  Patient is presently asymptomatic and laying down in bed comfortably.  Denies any shortness of breath, PND or orthopnea, leg edema, hemoptysis.  No recent weight change.  She has been compliant with her CPAP.  Past Medical History:  Diagnosis Date  . Anxiety   . Breast cancer ([place]) 2011   BILATERAL BREAST REMOVED  . Depression   . Fibromyalgia   . GERD (gastroesophageal reflux disease)   . Hypertension   . Hypothyroidism   . Migraine    MIGRAINES  . Overactive bladder   . Pneumonia    several times  . RUQ pain   . Sleep apnea    uses c-pap machine  . Thyroid disease      Past Surgical History:  Procedure Laterality Date  . ANTERIOR AND POSTERIOR REPAIR N/A 01/05/2017   Procedure: ANTERIOR (CYSTOCELE) AND POSTERIOR REPAIR (RECTOCELE);  Surgeon:  Paula Compton, MD;  Location: [facility];  Service: Gynecology;  Laterality: N/A;  . APPENDECTOMY    . CHOLECYSTECTOMY N/A 09/16/2015   Procedure: CHOLECYSTECTOMY;  Surgeon: [name], MD;  Location: [facility];  Service: General;  Laterality: N/A;  . deviated septum surgery in 1980's    . LAPAROSCOPIC LIVER CYST UNROOFING N/A 09/16/2015   Procedure: LAPAROSCOPIC LIVER CYST UNROOFING X'S 2;  Surgeon: [name], MD;  Location: [facility];  Service: General;  Laterality: N/A;  . LAPAROSCOPIC VAGINAL HYSTERECTOMY WITH SALPINGO OOPHORECTOMY Bilateral 01/05/2017   Procedure: LAPAROSCOPIC ASSISTED VAGINAL HYSTERECTOMY WITH SALPINGO OOPHORECTOMY;  Surgeon: Paula Compton, MD;  Location: [facility];  Service: Gynecology;  Laterality: Bilateral;  . SIMPLE MASTECTOMY W/ SENTINEL NODE BIOPSY Bilateral 2011  . TUBAL LIGATION       Family History  Problem Relation Age of Onset  . Breast cancer Mother   . CAD Father      Social History: Social History   Socioeconomic History  . Marital status: Married    Spouse name: Not on file  . Number of children: Not on file  . Years of education: Not on file  . Highest education level: Not on file  Occupational History  . Not on file  Social Needs  . Financial resource strain: Not very hard  . Food insecurity:    Worry: Never true    Inability: Never true  . Transportation  needs:    Medical: No    Non-medical: No  Tobacco Use  . Smoking status: Former Smoker    Last attempt to quit: 09/12/1975    Years since quitting: 43.2  . Smokeless tobacco: Never Used  Substance and Sexual Activity  . Alcohol use: No  . Drug use: No  . Sexual activity: Not on file  Lifestyle  . Physical activity:    Days per week: 0 days    Minutes per session: 0 min  . Stress: To some extent  Relationships  . Social connections:    Talks on phone: More than three times a week    Gets together: Once a week    Attends religious service: More than 4 times per year    Active  member of club or organization: No    Attends meetings of clubs or organizations: Never    Relationship status: Married  . Intimate partner violence:    Fear of current or ex partner: No    Emotionally abused: No    Physically abused: No    Forced sexual activity: No  Other Topics Concern  . Not on file  Social History Narrative  . Not on file     Medications Prior to Admission  Medication Sig Dispense Refill Last Dose  . cetirizine (ZYRTEC) 10 MG tablet Take 10 mg by mouth daily.   11/14/2018 at Unknown time  . Cholecalciferol (VITAMIN D3) 5000 units CAPS Take 5,000 Units by mouth daily.   11/14/2018 at Unknown time  . Coenzyme Q10 (CO Q-10) 100 MG CAPS Take 200 mg by mouth every morning.    11/14/2018 at Unknown time  . fluticasone (FLONASE) 50 MCG/ACT nasal spray Place 2 sprays into both nostrils daily as needed for allergies.    Past Week at prn  . hydrochlorothiazide (HYDRODIURIL) 12.5 MG tablet Take 12.5 mg by mouth daily.  6 11/14/2018 at Unknown time  . HYDROcodone-acetaminophen (NORCO/VICODIN) 5-325 MG tablet Take 1 tablet by mouth every 6 (six) hours as needed for moderate pain or severe pain.   0 Past Week at prn  . levothyroxine (SYNTHROID, LEVOTHROID) 50 MCG tablet Take 50 mcg by mouth daily before breakfast.   11/15/2018 at 0830  . omeprazole (PRILOSEC) 20 MG capsule Take 40 mg by mouth at bedtime.    11/14/2018 at Unknown time  . Probiotic Product (PROBIOTIC DAILY PO) Take 4 tablets by mouth every morning. gummie   11/14/2018 at Unknown time  . simvastatin (ZOCOR) 20 MG tablet Take 20 mg by mouth at bedtime.    11/14/2018 at Unknown time  . zolpidem (AMBIEN) 10 MG tablet Take 10 mg by mouth at bedtime.    11/14/2018 at Unknown time  . diazepam (VALIUM) 5 MG tablet Take 1 tablet (5 mg total) by mouth every 6 (six) hours as needed for anxiety. (Patient not taking: Reported on 08/21/2018) 30 tablet 1 Not Taking  . NON FORMULARY CPAP: At bedtime (nightly)   2 month   Review of Systems   Constitutional: Negative.   HENT: Negative.   Eyes: Negative.   Respiratory: Negative.   Cardiovascular: Positive for chest pain and palpitations. Negative for orthopnea, claudication and leg swelling.  Gastrointestinal: Negative.   Genitourinary: Negative.   Musculoskeletal: Negative.   Skin: Negative.   Neurological: Negative.   Psychiatric/Behavioral: Negative.   All other systems reviewed and are negative.   Physical Exam: Blood pressure 102/62, pulse 100, temperature 98.1 F (36.7 C), temperature source Oral, resp. rate Marland Kitchen)  9, height 5' 3"  (1.6 m), weight 74.1 kg, SpO2 97 %.  Body mass index is 28.95 kg/m. Physical Exam  Constitutional: She is oriented to person, place, and time. She appears well-developed and well-nourished. No distress.  HENT:  Head: Normocephalic and atraumatic.  Eyes: Conjunctivae are normal.  Neck: Neck supple. No JVD present. No thyromegaly present.  Cardiovascular: Normal rate, regular rhythm, normal heart sounds and intact distal pulses.  No murmur heard. Pulmonary/Chest: Effort normal and breath sounds normal.  Abdominal: Soft. Bowel sounds are normal.  Musculoskeletal: Normal range of motion. She exhibits no edema or tenderness.  Neurological: She is alert and oriented to person, place, and time.  Skin: Skin is warm and dry. She is not diaphoretic.   Labs:   Lab Results  Component Value Date   WBC 4.9 11/16/2018   HGB 13.0 11/16/2018   HCT 40.6 11/16/2018   MCV 81.7 11/16/2018   PLT 280 11/16/2018    Recent Labs  Lab 11/16/18 0756  NA 141  K 3.6  CL 107  CO2 25  BUN 12  CREATININE 1.03*  CALCIUM 8.9  GLUCOSE 113*    Lipid Panel     Component Value Date/Time   CHOL 164 11/16/2018 0218   TRIG 113 11/16/2018 0218   HDL 41 11/16/2018 0218   CHOLHDL 4.0 11/16/2018 0218   VLDL 23 11/16/2018 0218   LDLCALC 100 (H) 11/16/2018 0218   HEMOGLOBIN A1C Lab Results  Component Value Date   HGBA1C 5.6 11/16/2018   MPG 114.02  11/16/2018    Cardiac Panel (last 3 results) Recent Labs    11/15/18 1939 11/16/18 0218 11/16/18 0756  TROPONINI <0.03 <0.03 <0.03     TSH Recent Labs    11/15/18 1939  TSH 1.253    Radiology: Dg Chest 2 View  Result Date: 11/15/2018 CLINICAL DATA:  Chest pain, palpitations, dizziness for 2-3 days. EXAM: CHEST - 2 VIEW COMPARISON:  Chest x-ray dated 10/25/2017. FINDINGS: Heart size and mediastinal contours are stable. Atherosclerotic changes noted at the aortic arch. Lungs are clear. No pleural effusion or pneumothorax seen. No acute or suspicious osseous finding. Mild compression deformity of a lower thoracic vertebral body, unchanged. IMPRESSION: 1. No active cardiopulmonary disease. No evidence of pneumonia or pulmonary edema. 2. Aortic atherosclerosis. Electronically Signed   By: Franki Cabot M.D.   On: 11/15/2018 18:05    Scheduled Meds: . cholecalciferol  5,000 Units Oral Daily  . enoxaparin (LOVENOX) injection  40 mg Subcutaneous Q24H  . levothyroxine  50 mcg Oral QAC breakfast  . loratadine  10 mg Oral Daily  . pantoprazole  40 mg Oral Daily  . simvastatin  20 mg Oral QHS   Continuous Infusions: PRN Meds:.acetaminophen, fluticasone, hydrALAZINE, nitroGLYCERIN, ondansetron (ZOFRAN) IV, zolpidem  CARDIAC STUDIES:  EKG 11/16/2018: Normal sinus rhythm with rate of 94 bpm, nonspecific T abnormality, cannot exclude anterolateral ischemia.  Prolonged QT.  Compared to 11/15/2018 EKGs, ST changes slightly more prominent T wave inversion slightly more prominent.  ECHO 11/16/2018: Normal LV size, moderate LVH, normal LV systolic function at 60 to 65% without wall motion of normality, grade 1 diastolic dysfunction.  Normal aortic root size.  Mild TR, normal PA pressure.  No pericardial effusion.  ASSESSMENT AND PLAN:  1.  Palpitations, may indicate PACs and PVCs, however persistent symptoms may indicate atrial fibrillation in view of history of hypertension, obesity and  obstructive sleep apnea. 2.  Chest discomfort, no acute EKG abnormality, cardiac markers are negative.  Partially responsive chest pain to nitroglycerin this morning. 3.  Hypertension controlled 4.  Hyperlipidemia on simvastatin, continue present dose. 5.  Obstructive sleep apnea on CPAP and compliant.  Recommendation: Patient presently asymptomatic, I do not see acute EKG abnormalities although T wave inversion is slightly more prominent, patient completely asymptomatic and does not exhibit any symptoms to suggest unstable angina pectoris.  She does need cardiovascular risk stratification.  Echocardiogram reveals no wall motion of normality.  Also serum troponin completely normal.  Ambulate the patient, if no chest pain can be discharged home.  She is already eaten today and hence cannot perform a stress test, I will set her up for outpatient stress test tomorrow morning.  After stress testing, due to underlying sinus tachycardia, could consider adding a low-dose of a beta-blocker both for hypertension and to reduce her heart rate and hopefully this will help her to improve symptoms of palpitations.  Telemetry was evaluated, she has occasional PVCs.  No other complex arrhythmias.  She will take it easy until then.  I will also set up for an event monitor today to be worn for 30 days.  I have discussed with Dr. Eulogio Bear.  Patient is concerned about taking aspirin in view of prior GI issues.    Adrian Prows, MD 11/16/2018, 2:05 PM [facility] [place] Pager: [phone] Office: [phone] If no answer Cell [phone]

## 2018-11-16 NOTE — Progress Notes (Signed)
  Echocardiogram 2D Echocardiogram has been performed.  Darlina Sicilian M 11/16/2018, 10:36 AM

## 2018-11-17 DIAGNOSIS — R0789 Other chest pain: Secondary | ICD-10-CM | POA: Diagnosis not present

## 2018-11-17 DIAGNOSIS — R002 Palpitations: Secondary | ICD-10-CM | POA: Diagnosis not present

## 2018-11-20 NOTE — ED Provider Notes (Signed)
[facility] PROGRESSIVE CARE Provider Note   CSN: [id number] Arrival date & time: 11/15/18  1617     History   Chief Complaint Chief Complaint  Patient presents with  . Palpitations    HPI Kim Chavez is a 72 y.o. female.  HPI  73 year old female comes in with chief complaint of palpitations. Patient has history of fibromyalgia, depression, hypertension, hyperlipidemia.  Patient reports that over the past week she has had chest discomfort and palpitations.  Most of the time patient only gets palpitations and they usually resolve within 10 minutes on their own.  Patient does not get any associated dizziness, chest pain, shortness of breath until this evening while she was at [facility].  Patient was walking when she suddenly had palpitations with left-sided chest pain.  Patient had associated sweating but denies any nausea or shortness of breath.  Patient does not have any known coronary artery disease however reports that her father had coronary artery disease.  Patient does not smoke.  She has not had any provocative cardiac testing within the last 2 or 3 years.   Pt has no hx of PE, DVT and denies any exogenous hormone (testosterone / estrogen) use, long distance travels or surgery in the past 6 weeks, active cancer, recent immobilization.   Past Medical History:  Diagnosis Date  . Anxiety   . Breast cancer ([place]) 2011   BILATERAL BREAST REMOVED  . Depression   . Fibromyalgia   . GERD (gastroesophageal reflux disease)   . Hypertension   . Hypothyroidism   . Migraine    MIGRAINES  . Overactive bladder   . Pneumonia    several times  . RUQ pain   . Sleep apnea    uses c-pap machine  . Thyroid disease     Patient Active Problem List   Diagnosis Date Noted  . Chest pain 11/15/2018  . Heart palpitations 11/15/2018  . Hypokalemia 11/15/2018  . Elevated serum creatinine 11/15/2018  . Fibromyalgia 11/15/2018  . GERD (gastroesophageal reflux disease)  11/15/2018  . Insomnia 11/15/2018  . Episodic cluster headache, not intractable 02/16/2017  . Intractable migraine without aura and with status migrainosus 02/16/2017  . OSA (obstructive sleep apnea) 02/16/2017  . Comorbid sleep-related hypoventilation 02/16/2017  . Sleep related headaches 02/16/2017  . Constipation due to opioid therapy 02/16/2017  . Chronically on benzodiazepine therapy 02/16/2017  . Pelvic relaxation due to uterine prolapse 01/06/2017  . History of breast cancer 01/06/2017  . Status post laparoscopic assisted vaginal hysterectomy (LAVH) 01/05/2017  . Liver cyst 09/16/2015  . Hypothyroidism 05/01/2015  . HTN (hypertension) 05/01/2015  . Crohn's disease ([place]) 05/01/2015  . HLD (hyperlipidemia) 05/01/2015    Past Surgical History:  Procedure Laterality Date  . ANTERIOR AND POSTERIOR REPAIR N/A 01/05/2017   Procedure: ANTERIOR (CYSTOCELE) AND POSTERIOR REPAIR (RECTOCELE);  Surgeon: Paula Compton, MD;  Location: [facility];  Service: Gynecology;  Laterality: N/A;  . APPENDECTOMY    . CHOLECYSTECTOMY N/A 09/16/2015   Procedure: CHOLECYSTECTOMY;  Surgeon: [name], MD;  Location: [facility];  Service: General;  Laterality: N/A;  . deviated septum surgery in 1980's    . LAPAROSCOPIC LIVER CYST UNROOFING N/A 09/16/2015   Procedure: LAPAROSCOPIC LIVER CYST UNROOFING X'S 2;  Surgeon: [name], MD;  Location: [facility];  Service: General;  Laterality: N/A;  . LAPAROSCOPIC VAGINAL HYSTERECTOMY WITH SALPINGO OOPHORECTOMY Bilateral 01/05/2017   Procedure: LAPAROSCOPIC ASSISTED VAGINAL HYSTERECTOMY WITH SALPINGO OOPHORECTOMY;  Surgeon: Paula Compton, MD;  Location: [facility];  Service: Gynecology;  Laterality: Bilateral;  . SIMPLE MASTECTOMY W/ SENTINEL NODE BIOPSY Bilateral 2011  . TUBAL LIGATION       OB History   None      Home Medications    Prior to Admission medications   Medication Sig Start Date End Date Taking? Authorizing Provider  cetirizine (ZYRTEC) 10 MG  tablet Take 10 mg by mouth daily.   Yes [provider]  Cholecalciferol (VITAMIN D3) 5000 units CAPS Take 5,000 Units by mouth daily.   Yes [provider]  Coenzyme Q10 (CO Q-10) 100 MG CAPS Take 200 mg by mouth every morning.    Yes [provider]  fluticasone (FLONASE) 50 MCG/ACT nasal spray Place 2 sprays into both nostrils daily as needed for allergies.    Yes [provider]  hydrochlorothiazide (HYDRODIURIL) 12.5 MG tablet Take 12.5 mg by mouth daily. 11/06/18  Yes [provider]  HYDROcodone-acetaminophen (NORCO/VICODIN) 5-325 MG tablet Take 1 tablet by mouth every 6 (six) hours as needed for moderate pain or severe pain.  08/19/17  Yes [provider]  levothyroxine (SYNTHROID, LEVOTHROID) 50 MCG tablet Take 50 mcg by mouth daily before breakfast.   Yes [provider]  omeprazole (PRILOSEC) 20 MG capsule Take 40 mg by mouth at bedtime.    Yes [provider]  Probiotic Product (PROBIOTIC DAILY PO) Take 4 tablets by mouth every morning. gummie   Yes [provider]  simvastatin (ZOCOR) 20 MG tablet Take 20 mg by mouth at bedtime.    Yes [provider]  zolpidem (AMBIEN) 10 MG tablet Take 10 mg by mouth at bedtime.    Yes [provider]  NON FORMULARY CPAP: At bedtime (nightly)    [provider]    Family History Family History  Problem Relation Age of Onset  . Breast cancer Mother   . CAD Father     Social History Social History   Tobacco Use  . Smoking status: Former Smoker    Last attempt to quit: 09/12/1975    Years since quitting: 43.2  . Smokeless tobacco: Never Used  Substance Use Topics  . Alcohol use: No  . Drug use: No     Allergies   Aspirin; Erythromycin; Ibuprofen; Tape; Codeine; Other; Penicillins; and Neosporin [neomycin-bacitracin zn-polymyx]   Review of Systems Review of Systems  Constitutional: Positive for activity change.  Respiratory:  Negative for shortness of breath.   Cardiovascular: Positive for chest pain and palpitations.  Gastrointestinal: Negative for nausea.  Neurological: Negative for syncope.  All other systems reviewed and are negative.    Physical Exam Updated Vital Signs BP 102/62   Pulse 100   Temp 98.1 F (36.7 C) (Oral)   Resp (!) 9   Ht 5' 3"  (1.6 m)   Wt 74.1 kg   SpO2 97%   BMI 28.95 kg/m   Physical Exam  Constitutional: She is oriented to person, place, and time. She appears well-developed.  HENT:  Head: Normocephalic and atraumatic.  Eyes: EOM are normal.  Neck: Normal range of motion. Neck supple.  Cardiovascular: Normal rate.  Pulmonary/Chest: Effort normal.  Abdominal: Bowel sounds are normal.  Neurological: She is alert and oriented to person, place, and time.  Skin: Skin is warm and dry.  Nursing note and vitals reviewed.    ED Treatments / Results  Labs (all labs ordered are listed, but only abnormal results are displayed) Labs Reviewed  BASIC METABOLIC PANEL - Abnormal; Notable  for the following components:      Result Value   Potassium 3.1 (*)    Glucose, Bld 100 (*)    Creatinine, Ser 1.36 (*)    GFR calc non Af Amer 39 (*)    GFR calc Af Amer 45 (*)    Anion gap 16 (*)    All other components within normal limits  CBC - Abnormal; Notable for the following components:   RBC 5.81 (*)    Hemoglobin 15.3 (*)    HCT 47.3 (*)    All other components within normal limits  LIPID PANEL - Abnormal; Notable for the following components:   LDL Cholesterol 100 (*)    All other components within normal limits  BASIC METABOLIC PANEL - Abnormal; Notable for the following components:   Glucose, Bld 113 (*)    Creatinine, Ser 1.03 (*)    GFR calc non Af Amer 54 (*)    All other components within normal limits  MRSA PCR SCREENING  TROPONIN I  TROPONIN I  TROPONIN I  TSH  MAGNESIUM  HEMOGLOBIN A1C  CBC  I-STAT TROPONIN, ED    EKG EKG  Interpretation  Date/Time:  Wednesday November 15 2018 17:26:48 EST Ventricular Rate:  100 PR Interval:  160 QRS Duration: 74 QT Interval:  310 QTC Calculation: 399 R Axis:   31 Text Interpretation:  Sinus rhythm with Premature atrial complexes with Abberant conduction Nonspecific T wave abnormality Abnormal ECG No acute changes [id number] Confirmed by Varney Biles [id number]) on 11/15/2018 6:08:41 PM   Radiology No results found.  Procedures Procedures (including critical care time)  Medications Ordered in ED Medications  aspirin chewable tablet 324 mg (324 mg Oral Given 11/15/18 1854)  morphine 4 MG/ML injection 4 mg (4 mg Intravenous Given 11/15/18 1916)  potassium chloride SA (K-DUR,KLOR-CON) CR tablet 40 mEq (40 mEq Oral Given 11/15/18 2115)     Initial Impression / Assessment and Plan / ED Course  I have reviewed the triage vital signs and the nursing notes.  Pertinent labs & imaging results that were available during my care of the patient were reviewed by me and considered in my medical decision making (see chart for details).     72 year old female with multiple medical comorbidities including hypertension, hyper lipidemia, liver cirrhosis comes in with chief complaint of palpitations and chest discomfort.  It appears that patient has been having Intermittent episodes of unprovoked palpitations the last few days, however today she had chest discomfort with the palpitations while she was walking.  Given patient's risk factors and her age, her HEAR score is >3. The chest pain is atypical but we will admit her for further cardiac work-up.  EKG is not showing any acute findings, however there is some nonspecific abnormalities and PVCs.  She also has S1Q3T3, however suspicion for PE is extremely low, and we will not the work-up at this time after discussing the low probability with the patient who agrees.  Final Clinical Impressions(s) / ED Diagnoses   Final diagnoses:   Palpitations  Precordial chest pain    ED Discharge Orders         Ordered    Increase activity slowly     11/16/18 1417    Diet - low sodium heart healthy     11/16/18 1417    Discharge instructions    Comments:  30 day event monitor and stress test with Dr. Einar Gip   11/16/18 1417  Varney Biles, MD 11/20/18 [phone]

## 2018-11-21 DIAGNOSIS — D1801 Hemangioma of skin and subcutaneous tissue: Secondary | ICD-10-CM | POA: Diagnosis not present

## 2018-11-21 DIAGNOSIS — Z85828 Personal history of other malignant neoplasm of skin: Secondary | ICD-10-CM | POA: Diagnosis not present

## 2018-11-21 DIAGNOSIS — D2262 Melanocytic nevi of left upper limb, including shoulder: Secondary | ICD-10-CM | POA: Diagnosis not present

## 2018-11-21 DIAGNOSIS — D225 Melanocytic nevi of trunk: Secondary | ICD-10-CM | POA: Diagnosis not present

## 2018-11-21 DIAGNOSIS — D692 Other nonthrombocytopenic purpura: Secondary | ICD-10-CM | POA: Diagnosis not present

## 2018-11-21 DIAGNOSIS — D2221 Melanocytic nevi of right ear and external auricular canal: Secondary | ICD-10-CM | POA: Diagnosis not present

## 2018-11-21 DIAGNOSIS — B353 Tinea pedis: Secondary | ICD-10-CM | POA: Diagnosis not present

## 2018-11-21 DIAGNOSIS — L918 Other hypertrophic disorders of the skin: Secondary | ICD-10-CM | POA: Diagnosis not present

## 2018-11-23 DIAGNOSIS — R0789 Other chest pain: Secondary | ICD-10-CM | POA: Diagnosis not present

## 2018-11-23 DIAGNOSIS — E038 Other specified hypothyroidism: Secondary | ICD-10-CM | POA: Diagnosis not present

## 2018-11-23 DIAGNOSIS — Z6828 Body mass index (BMI) 28.0-28.9, adult: Secondary | ICD-10-CM | POA: Diagnosis not present

## 2018-11-23 DIAGNOSIS — F331 Major depressive disorder, recurrent, moderate: Secondary | ICD-10-CM | POA: Diagnosis not present

## 2018-11-23 DIAGNOSIS — I10 Essential (primary) hypertension: Secondary | ICD-10-CM | POA: Diagnosis not present

## 2018-11-23 DIAGNOSIS — E876 Hypokalemia: Secondary | ICD-10-CM | POA: Diagnosis not present

## 2018-11-23 DIAGNOSIS — R002 Palpitations: Secondary | ICD-10-CM | POA: Diagnosis not present

## 2018-11-30 DIAGNOSIS — R9431 Abnormal electrocardiogram [ECG] [EKG]: Secondary | ICD-10-CM | POA: Diagnosis not present

## 2018-11-30 DIAGNOSIS — R002 Palpitations: Secondary | ICD-10-CM | POA: Diagnosis not present

## 2018-11-30 DIAGNOSIS — R072 Precordial pain: Secondary | ICD-10-CM | POA: Diagnosis not present

## 2018-11-30 DIAGNOSIS — I700 Atherosclerosis of aorta: Secondary | ICD-10-CM | POA: Diagnosis not present

## 2018-12-15 DIAGNOSIS — R002 Palpitations: Secondary | ICD-10-CM | POA: Diagnosis not present

## 2019-03-15 DIAGNOSIS — R002 Palpitations: Secondary | ICD-10-CM | POA: Diagnosis not present

## 2019-03-15 DIAGNOSIS — M797 Fibromyalgia: Secondary | ICD-10-CM | POA: Diagnosis not present

## 2019-03-15 DIAGNOSIS — F331 Major depressive disorder, recurrent, moderate: Secondary | ICD-10-CM | POA: Diagnosis not present

## 2019-03-15 DIAGNOSIS — G4733 Obstructive sleep apnea (adult) (pediatric): Secondary | ICD-10-CM | POA: Diagnosis not present

## 2019-05-23 DIAGNOSIS — K5900 Constipation, unspecified: Secondary | ICD-10-CM | POA: Diagnosis not present

## 2019-05-23 DIAGNOSIS — R1013 Epigastric pain: Secondary | ICD-10-CM | POA: Diagnosis not present

## 2019-05-23 DIAGNOSIS — M545 Low back pain: Secondary | ICD-10-CM | POA: Diagnosis not present

## 2019-05-23 DIAGNOSIS — R945 Abnormal results of liver function studies: Secondary | ICD-10-CM | POA: Diagnosis not present

## 2019-05-23 DIAGNOSIS — M797 Fibromyalgia: Secondary | ICD-10-CM | POA: Diagnosis not present

## 2019-05-23 DIAGNOSIS — I10 Essential (primary) hypertension: Secondary | ICD-10-CM | POA: Diagnosis not present

## 2019-05-23 DIAGNOSIS — R140 Abdominal distension (gaseous): Secondary | ICD-10-CM | POA: Diagnosis not present

## 2019-05-23 DIAGNOSIS — R110 Nausea: Secondary | ICD-10-CM | POA: Diagnosis not present

## 2019-05-23 DIAGNOSIS — R7301 Impaired fasting glucose: Secondary | ICD-10-CM | POA: Diagnosis not present

## 2019-05-24 DIAGNOSIS — M545 Low back pain: Secondary | ICD-10-CM | POA: Diagnosis not present

## 2019-05-24 DIAGNOSIS — I10 Essential (primary) hypertension: Secondary | ICD-10-CM | POA: Diagnosis not present

## 2019-05-25 DIAGNOSIS — R7301 Impaired fasting glucose: Secondary | ICD-10-CM | POA: Diagnosis not present

## 2019-06-08 ENCOUNTER — Other Ambulatory Visit: Payer: Self-pay | Admitting: Physician Assistant

## 2019-06-08 DIAGNOSIS — R197 Diarrhea, unspecified: Secondary | ICD-10-CM

## 2019-06-08 DIAGNOSIS — K5090 Crohn's disease, unspecified, without complications: Secondary | ICD-10-CM

## 2019-06-08 DIAGNOSIS — R109 Unspecified abdominal pain: Secondary | ICD-10-CM

## 2019-06-08 DIAGNOSIS — Z853 Personal history of malignant neoplasm of breast: Secondary | ICD-10-CM

## 2019-06-11 ENCOUNTER — Other Ambulatory Visit: Payer: Self-pay

## 2019-06-11 ENCOUNTER — Ambulatory Visit
Admission: RE | Admit: 2019-06-11 | Discharge: 2019-06-11 | Disposition: A | Discharge status: Home / Self Care | Payer: Medicare Other | Source: Ambulatory Visit | Attending: Physician Assistant | Admitting: Physician Assistant

## 2019-06-11 DIAGNOSIS — R109 Unspecified abdominal pain: Secondary | ICD-10-CM

## 2019-06-11 DIAGNOSIS — R197 Diarrhea, unspecified: Secondary | ICD-10-CM

## 2019-06-11 DIAGNOSIS — N200 Calculus of kidney: Secondary | ICD-10-CM | POA: Diagnosis not present

## 2019-06-11 DIAGNOSIS — K5090 Crohn's disease, unspecified, without complications: Secondary | ICD-10-CM

## 2019-06-11 DIAGNOSIS — Z853 Personal history of malignant neoplasm of breast: Secondary | ICD-10-CM

## 2019-06-11 MED ORDER — IOHEXOL 300 MG/ML  SOLN
100.0000 mL | Freq: Once | INTRAMUSCULAR | Status: AC | PRN
  Administered 2019-06-11: 15:00:00 100 mL via INTRAVENOUS

## 2019-06-18 DIAGNOSIS — K5000 Crohn's disease of small intestine without complications: Secondary | ICD-10-CM | POA: Diagnosis not present

## 2019-06-18 DIAGNOSIS — R1013 Epigastric pain: Secondary | ICD-10-CM | POA: Diagnosis not present

## 2019-06-18 DIAGNOSIS — R197 Diarrhea, unspecified: Secondary | ICD-10-CM | POA: Diagnosis not present

## 2019-06-18 DIAGNOSIS — R1032 Left lower quadrant pain: Secondary | ICD-10-CM | POA: Diagnosis not present

## 2019-07-16 DIAGNOSIS — K5090 Crohn's disease, unspecified, without complications: Secondary | ICD-10-CM | POA: Diagnosis not present

## 2019-08-13 DIAGNOSIS — Z01419 Encounter for gynecological examination (general) (routine) without abnormal findings: Secondary | ICD-10-CM | POA: Diagnosis not present

## 2019-08-25 DIAGNOSIS — Z23 Encounter for immunization: Secondary | ICD-10-CM | POA: Diagnosis not present

## 2019-08-29 DIAGNOSIS — E038 Other specified hypothyroidism: Secondary | ICD-10-CM | POA: Diagnosis not present

## 2019-08-29 DIAGNOSIS — R7301 Impaired fasting glucose: Secondary | ICD-10-CM | POA: Diagnosis not present

## 2019-08-29 DIAGNOSIS — E7849 Other hyperlipidemia: Secondary | ICD-10-CM | POA: Diagnosis not present

## 2019-08-29 DIAGNOSIS — M859 Disorder of bone density and structure, unspecified: Secondary | ICD-10-CM | POA: Diagnosis not present

## 2019-08-30 DIAGNOSIS — I10 Essential (primary) hypertension: Secondary | ICD-10-CM | POA: Diagnosis not present

## 2019-08-30 DIAGNOSIS — R82998 Other abnormal findings in urine: Secondary | ICD-10-CM | POA: Diagnosis not present

## 2019-09-05 DIAGNOSIS — K5090 Crohn's disease, unspecified, without complications: Secondary | ICD-10-CM | POA: Diagnosis not present

## 2019-09-05 DIAGNOSIS — I10 Essential (primary) hypertension: Secondary | ICD-10-CM | POA: Diagnosis not present

## 2019-09-05 DIAGNOSIS — Z0000 Encounter for general adult medical examination without abnormal findings: Secondary | ICD-10-CM | POA: Diagnosis not present

## 2019-09-05 DIAGNOSIS — R140 Abdominal distension (gaseous): Secondary | ICD-10-CM | POA: Diagnosis not present

## 2019-09-05 DIAGNOSIS — Z1331 Encounter for screening for depression: Secondary | ICD-10-CM | POA: Diagnosis not present

## 2019-09-05 DIAGNOSIS — E039 Hypothyroidism, unspecified: Secondary | ICD-10-CM | POA: Diagnosis not present

## 2019-09-05 DIAGNOSIS — M797 Fibromyalgia: Secondary | ICD-10-CM | POA: Diagnosis not present

## 2019-09-05 DIAGNOSIS — F331 Major depressive disorder, recurrent, moderate: Secondary | ICD-10-CM | POA: Diagnosis not present

## 2019-09-05 DIAGNOSIS — R7301 Impaired fasting glucose: Secondary | ICD-10-CM | POA: Diagnosis not present

## 2019-09-05 DIAGNOSIS — E785 Hyperlipidemia, unspecified: Secondary | ICD-10-CM | POA: Diagnosis not present

## 2019-09-05 DIAGNOSIS — M8580 Other specified disorders of bone density and structure, unspecified site: Secondary | ICD-10-CM | POA: Diagnosis not present

## 2019-09-14 DIAGNOSIS — J309 Allergic rhinitis, unspecified: Secondary | ICD-10-CM | POA: Diagnosis not present

## 2019-09-14 DIAGNOSIS — R0600 Dyspnea, unspecified: Secondary | ICD-10-CM | POA: Diagnosis not present

## 2019-09-14 DIAGNOSIS — J069 Acute upper respiratory infection, unspecified: Secondary | ICD-10-CM | POA: Diagnosis not present

## 2019-09-14 DIAGNOSIS — Z20818 Contact with and (suspected) exposure to other bacterial communicable diseases: Secondary | ICD-10-CM | POA: Diagnosis not present

## 2019-09-19 DIAGNOSIS — H43813 Vitreous degeneration, bilateral: Secondary | ICD-10-CM | POA: Diagnosis not present

## 2019-09-19 DIAGNOSIS — H52202 Unspecified astigmatism, left eye: Secondary | ICD-10-CM | POA: Diagnosis not present

## 2019-09-19 DIAGNOSIS — H04123 Dry eye syndrome of bilateral lacrimal glands: Secondary | ICD-10-CM | POA: Diagnosis not present

## 2019-09-19 DIAGNOSIS — H26493 Other secondary cataract, bilateral: Secondary | ICD-10-CM | POA: Diagnosis not present

## 2019-09-21 DIAGNOSIS — J069 Acute upper respiratory infection, unspecified: Secondary | ICD-10-CM | POA: Diagnosis not present

## 2019-09-21 DIAGNOSIS — R0600 Dyspnea, unspecified: Secondary | ICD-10-CM | POA: Diagnosis not present

## 2019-09-21 DIAGNOSIS — R030 Elevated blood-pressure reading, without diagnosis of hypertension: Secondary | ICD-10-CM | POA: Diagnosis not present

## 2019-09-21 DIAGNOSIS — R0789 Other chest pain: Secondary | ICD-10-CM | POA: Diagnosis not present

## 2019-09-26 DIAGNOSIS — R1010 Upper abdominal pain, unspecified: Secondary | ICD-10-CM | POA: Diagnosis not present

## 2019-09-26 DIAGNOSIS — K5000 Crohn's disease of small intestine without complications: Secondary | ICD-10-CM | POA: Diagnosis not present

## 2019-09-27 DIAGNOSIS — Z9013 Acquired absence of bilateral breasts and nipples: Secondary | ICD-10-CM | POA: Diagnosis not present

## 2019-09-27 DIAGNOSIS — D0502 Lobular carcinoma in situ of left breast: Secondary | ICD-10-CM | POA: Diagnosis not present

## 2019-09-27 DIAGNOSIS — Z853 Personal history of malignant neoplasm of breast: Secondary | ICD-10-CM | POA: Diagnosis not present

## 2019-09-27 DIAGNOSIS — R590 Localized enlarged lymph nodes: Secondary | ICD-10-CM | POA: Diagnosis not present

## 2019-09-27 DIAGNOSIS — Z08 Encounter for follow-up examination after completed treatment for malignant neoplasm: Secondary | ICD-10-CM | POA: Diagnosis not present

## 2019-10-04 ENCOUNTER — Other Ambulatory Visit: Payer: Self-pay | Admitting: *Deleted

## 2019-10-04 DIAGNOSIS — H26491 Other secondary cataract, right eye: Secondary | ICD-10-CM | POA: Diagnosis not present

## 2019-10-04 NOTE — Patient Outreach (Signed)
[facility]) Care Management  10/04/2019  Kim Chavez Mar 04, 1946 [id number]   Referral Date: 10/04/2019 Referral Source: MD office Referral Reason: Medication assistance, [employer] Insurance: [employer]   Outreach attempt # 1, successful.  Social: Lives with her husband, able to perform ADLs independently.  Report she has been having GI problems over the past several months due to inability to afford medications that provided relief for condition.  Conditions: Per chart, has history of HTN, Crohn's, GERD, Hypothyroid, HLD, and OSA.  Medications: Reviewed with member, denies problems with compliance.  Was previously on Pentasa, which worked for GI condition but stopped taking due to cost.  All other medications tried reportedly does not work.  Appointments:  Last appointment with PCP on 9/23, denies any problems with transportation.    Assessment complete, report all conditions controlled with the exception of her Crohn's.  Feel as if this would also be controlled if she was able to restart the Pentasa.  Denies any nursing needs at this time.  Plan: RN CM will place referral to pharmacist and send member a successful outreach letter with this care manager's contact information for future purposes.  Valente David, [place], MSN [place] [phone]

## 2019-10-05 ENCOUNTER — Telehealth: Payer: Self-pay | Admitting: Pharmacist

## 2019-10-05 NOTE — Patient Outreach (Addendum)
[facility]) Care Management  [place]   10/05/2019  Kim Chavez [birth year] [id number]  Reason for referral: medication assistance  Referral source: [facility] Nurse/Provider Referral medication(s): Pentasa Current insurance:  PMHx: Christella Scheuermann   HPI:  Patient is a 73 year old female with multiple medical conditions including but not limited to:  Chron's disease, Fibromyalgia, hyperlipidemia, hypertension, hypothyroidism, OSA on CPAP, and insomnia.  Patient reported that she used to take Pentasa given to her by per provider as samples due to the cost. She said samples are no longer available and she cannot afford the copay through her insurance. Patient said she has tried several other therapies and Pentasa was the most effective.   Objective: Allergies  Allergen Reactions  . Aspirin Other (See Comments)    Almost caused ulcers Almost caused ulcers  . Erythromycin Nausea And Vomiting  . Ibuprofen Other (See Comments)    Has Crohn's and was instructed to NOT take Has Crohn's and was instructed to NOT take  . Tape Rash    Plastic   . Codeine Nausea And Vomiting    'Deathly sick on stomach"   . Other Diarrhea    Cramping also- Nothing with seeds/has Crohn's!!  . Penicillins Swelling    Arms became swollen Has patient had a PCN reaction causing immediate rash, facial/tongue/throat swelling, SOB or lightheadedness with hypotension: Yes Has patient had a PCN reaction causing severe rash involving mucus membranes or skin necrosis: No Has patient had a PCN reaction that required hospitalization: No Has patient had a PCN reaction occurring within the last 10 years: No If all of the above answers are "NO", then may proceed with Cephalosporin use.   . Neosporin [Neomycin-Bacitracin Zn-Polymyx] Itching, Rash and Other (See Comments)    Turns red around the site and itches like crazy for about two weeks.     Medications Reviewed Today    Reviewed by Elayne Guerin, [facility] (Pharmacist) on 10/05/19 at 1240  Med List Status: <None>  Medication Order Taking? Sig Documenting Provider Last Dose Status Informant  Cholecalciferol (VITAMIN D3) 50 MCG (2000 UT) capsule [id number] Yes Take 2,000 Units by mouth daily.  [provider] Taking Active Self  Coenzyme Q10 (CO Q-10) 100 MG CAPS [id number] Yes Take 200 mg by mouth every morning.  [provider] Taking Active Self  cyclobenzaprine (FLEXERIL) 10 MG tablet [id number] Yes TAKE 1 2 TO 1 (ONE HALF TO ONE) TABLET BY MOUTH EVERY 8 HOURS AS NEEDED FOR MUSCLE SPASM [provider] Taking Active   famotidine (PEPCID) 20 MG tablet [id number] Yes Take 20 mg by mouth 2 (two) times daily. [provider] Taking Active   fluticasone (FLONASE) 50 MCG/ACT nasal spray [id number] Yes Place 2 sprays into both nostrils daily as needed for allergies.  [provider] Taking Active Self  HYDROcodone-acetaminophen (NORCO/VICODIN) 5-325 MG tablet [id number] Yes Take 1 tablet by mouth every 6 (six) hours as needed for moderate pain or severe pain.  [provider] Taking Active Self  levothyroxine (SYNTHROID, LEVOTHROID) 50 MCG tablet [id number] Yes Take 50 mcg by mouth daily before breakfast. [provider] Taking Active Self  mesalamine (PENTASA) 500 MG CR capsule [id number] No Take 1,000 mg by mouth 4 (four) times daily. [provider] Not Taking Active            Med Note Elayne Guerin   Fri Oct 05, 2019 12:40 PM) Investigating patient assistance due to cost  NON FORMULARY  [id number] Yes CPAP: At bedtime (nightly) [provider] Taking Active Self  omeprazole (PRILOSEC) 20 MG capsule [id number] Yes Take 40 mg by mouth at bedtime.  [provider] Taking Active Self  Probiotic Product (PROBIOTIC DAILY PO) [id number] Yes Take 4 tablets by mouth every morning. gummie [provider] Taking Active Self  simvastatin (ZOCOR) 40 MG tablet  [id number] Yes Take 1 tablet by mouth at bedtime. [provider] Taking Active   zolpidem (AMBIEN) 10 MG tablet [id number] Yes Take 10 mg by mouth at bedtime.  [provider] Taking Active Self          Assessment:  Drugs sorted by system:  Neurologic/Psychologic: Zolpidem  Cardiovascular: Simvastatin  Pulmonary/Allergy: Fexofenadine, Fluticasone Nasal Spray,  CPAP,   Gastrointestinal: Famotidine, Pentasa,   Endocrine: Levothyroxine,   Pain: Cyclobenzaprine, Hydrocodone/APAP,   Vitamins/Minerals/Supplements: Vitamin D, Coenzyme Q-10, Probiotic,    Medication Review Findings:  . Zolpidem-high dose in elderly may consider decreasing the dose to 5 mg daily due to increased risk of CNS depression and risk of falls.   Medication Assistance Findings:  Medication assistance needs identified: Pentasa  Patient is over income for Extra Help. She may qualify to receive Pentasa from [employer]'s Patient Assistance Form  A discussion was had with the patient about the necessary financial documentation. 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   Additional medication assistance options reviewed with patient as warranted:  No other options identified  Plan: I will route patient assistance letter to [place] technician who will coordinate patient assistance program application process for medications listed above.  [facility] pharmacy technician will assist with obtaining all required documents from both patient and provider(s) and submit application(s) once completed.    Follow up with the patient in 6 weeks.  Route note to PCP  Elayne Guerin, PharmD, [place] Clinical Pharmacist [phone]

## 2019-10-08 DIAGNOSIS — E042 Nontoxic multinodular goiter: Secondary | ICD-10-CM | POA: Diagnosis not present

## 2019-10-08 DIAGNOSIS — R590 Localized enlarged lymph nodes: Secondary | ICD-10-CM | POA: Diagnosis not present

## 2019-10-08 DIAGNOSIS — Z86000 Personal history of in-situ neoplasm of breast: Secondary | ICD-10-CM | POA: Diagnosis not present

## 2019-10-09 ENCOUNTER — Other Ambulatory Visit: Payer: Self-pay | Admitting: Pharmacy Technician

## 2019-10-09 NOTE — Patient Outreach (Signed)
[place] [facility]) Care Management  10/09/2019  AMRIT ERCK 1946/08/11 [id number]                                        Medication Assistance Referral  Referral From: [place]  Medication/Company: Pentasa / [employer] Patient application portion:  Mailed Provider application portion: Faxed  to Dr. Marton Redwood Provider address/fax verified via: Office website     Follow up:  Will follow up with patient in 5-10 business days to confirm application(s) have been received.  Adger Cantera P. Dorotea Hand, [place] Management [phone]

## 2019-10-10 DIAGNOSIS — N390 Urinary tract infection, site not specified: Secondary | ICD-10-CM | POA: Diagnosis not present

## 2019-10-10 DIAGNOSIS — R300 Dysuria: Secondary | ICD-10-CM | POA: Diagnosis not present

## 2019-10-11 DIAGNOSIS — H26492 Other secondary cataract, left eye: Secondary | ICD-10-CM | POA: Diagnosis not present

## 2019-10-17 ENCOUNTER — Other Ambulatory Visit: Payer: Self-pay | Admitting: Internal Medicine

## 2019-10-17 ENCOUNTER — Other Ambulatory Visit: Payer: Self-pay | Admitting: Pharmacy Technician

## 2019-10-17 DIAGNOSIS — K5000 Crohn's disease of small intestine without complications: Secondary | ICD-10-CM | POA: Diagnosis not present

## 2019-10-17 DIAGNOSIS — E041 Nontoxic single thyroid nodule: Secondary | ICD-10-CM

## 2019-10-17 NOTE — Patient Outreach (Signed)
[facility] Care Management  10/17/2019  [name] Nov 03, 1946 [id number]    Successful call placed to patient regarding patient assistance application(s) for Pentasa with Bernita Buffy , HIPAA identifiers verified. Patient informed she has received the applications. Patient informed she had no questions about the application. Confirmed patient had phone number located on business card included with the application. Patient informed she would try and place in mail this week.  Follow up:  Will route note to [place] for case closure if document(s) have not been received in the next 15 business days.  Herberta Pickron P. Abel Ra, [place] Management [phone]

## 2019-10-18 DIAGNOSIS — K760 Fatty (change of) liver, not elsewhere classified: Secondary | ICD-10-CM | POA: Diagnosis not present

## 2019-10-18 DIAGNOSIS — Z853 Personal history of malignant neoplasm of breast: Secondary | ICD-10-CM | POA: Diagnosis not present

## 2019-10-18 DIAGNOSIS — E041 Nontoxic single thyroid nodule: Secondary | ICD-10-CM | POA: Diagnosis not present

## 2019-10-18 DIAGNOSIS — R590 Localized enlarged lymph nodes: Secondary | ICD-10-CM | POA: Diagnosis not present

## 2019-10-18 DIAGNOSIS — D0502 Lobular carcinoma in situ of left breast: Secondary | ICD-10-CM | POA: Diagnosis not present

## 2019-10-18 DIAGNOSIS — K769 Liver disease, unspecified: Secondary | ICD-10-CM | POA: Diagnosis not present

## 2019-10-18 DIAGNOSIS — R918 Other nonspecific abnormal finding of lung field: Secondary | ICD-10-CM | POA: Diagnosis not present

## 2019-10-18 DIAGNOSIS — Z9013 Acquired absence of bilateral breasts and nipples: Secondary | ICD-10-CM | POA: Diagnosis not present

## 2019-10-19 ENCOUNTER — Encounter: Payer: Self-pay | Admitting: Primary Care

## 2019-10-19 ENCOUNTER — Other Ambulatory Visit: Payer: Self-pay

## 2019-10-19 ENCOUNTER — Ambulatory Visit (INDEPENDENT_AMBULATORY_CARE_PROVIDER_SITE_OTHER): Payer: Medicare Other | Admitting: Primary Care

## 2019-10-19 ENCOUNTER — Telehealth: Payer: Self-pay | Admitting: Pulmonary Disease

## 2019-10-19 ENCOUNTER — Telehealth: Payer: Self-pay | Admitting: Primary Care

## 2019-10-19 VITALS — Temp 98.2°F

## 2019-10-19 DIAGNOSIS — R0602 Shortness of breath: Secondary | ICD-10-CM | POA: Diagnosis not present

## 2019-10-19 DIAGNOSIS — J209 Acute bronchitis, unspecified: Secondary | ICD-10-CM | POA: Diagnosis not present

## 2019-10-19 MED ORDER — BREO ELLIPTA 100-25 MCG/INH IN AEPB: 1.0000 | INHALATION_SPRAY | Freq: Every day | RESPIRATORY_TRACT | 0 refills | Status: DC

## 2019-10-19 NOTE — Patient Instructions (Addendum)
Recommendatoins: Stop Stiolto  Trial BREO- take 1 puff once daily Use Albuterol rescue inhaler as needed- take 2 puffs every 4-6 hours for breakthrough shortness of breath/wheezing Delsym cough syrup twice daily Mucinex twice a day as needed for chest congestion   Orders: Needs PFTs  Follow-up: 2-4 weeks with Eustaquio Maize, NP    Acute Bronchitis, Adult Acute bronchitis is when air tubes (bronchi) in the lungs suddenly get swollen. The condition can make it hard to breathe. It can also cause these symptoms:  A cough.  Coughing up clear, yellow, or green mucus.  Wheezing.  Chest congestion.  Shortness of breath.  A fever.  Body aches.  Chills.  A sore throat. Follow these instructions at home:  Medicines  Take over-the-counter and prescription medicines only as told by your doctor.  If you were prescribed an antibiotic medicine, take it as told by your doctor. Do not stop taking the antibiotic even if you start to feel better. General instructions  Rest.  Drink enough fluids to keep your pee (urine) pale yellow.  Avoid smoking and secondhand smoke. If you smoke and you need help quitting, ask your doctor. Quitting will help your lungs heal faster.  Use an inhaler, cool mist vaporizer, or humidifier as told by your doctor.  Keep all follow-up visits as told by your doctor. This is important. How is this prevented? To lower your risk of getting this condition again:  Wash your hands often with soap and water. If you cannot use soap and water, use hand sanitizer.  Avoid contact with people who have cold symptoms.  Try not to touch your hands to your mouth, nose, or eyes.  Make sure to get the flu shot every year. Contact a doctor if:  Your symptoms do not get better in 2 weeks. Get help right away if:  You cough up blood.  You have chest pain.  You have very bad shortness of breath.  You become dehydrated.  You faint (pass out) or keep feeling like you  are going to pass out.  You keep throwing up (vomiting).  You have a very bad headache.  Your fever or chills gets worse. This information is not intended to replace advice given to you by your health care provider. Make sure you discuss any questions you have with your health care provider. Document Released: 05/17/2008 Document Revised: 11/11/2017 Document Reviewed: 05/19/2016 Elsevier Patient Education  2020 [employer].

## 2019-10-19 NOTE — Progress Notes (Signed)
Virtual Visit via Telephone Note  I connected with Kim Chavez on 10/19/19 at  3:00 PM EST by telephone and verified that I am speaking with the correct person using two identifiers.  Location: Patient: Home Provider: Office   I discussed the limitations, risks, security and privacy concerns of performing an evaluation and management service by telephone and the availability of in person appointments. I also discussed with the patient that there may be a patient responsible charge related to this service. The patient expressed understanding and agreed to proceed.   History of Present Illness: 73 year old female, former smoker in 1976 (5 pack year hx). PMH significant for OSA, HTN, GERD, Crohn's, hypothyroidism, HLD, breast cancer. Patient of Dr. Elsworth Soho followed for sleep apnea, last seen on 08/21/18 for regular office visit. Maintained on CPAP 5-10cm h20.    10/19/2019 Patient contacted today for acute televist. Reports dry cough and shortness of breath x 3 weeks. Associated chest congestion and chest tightness. She does not feel like she is getting enough oxygen when she takes a deep breath. Her PCP gave her doxycycline and prednisone course x2. She was placed Stiolto, she did not notice a difference. She does not have a significant pack year hx and quit smoking in 1976. No PFTs on file. She does seem to respond to oral steriods but once completing her shortness of breath returns. CT chest in October showed no evidence of pneumonia, pleural effusion or pneumothorax. She had a Covid test on 10/2 at [facility] that was negative. Denies leg swelling or weight gain.    Observations/Objective:  Temp 98.2  TEST/Events  CT chest on 10/09/19 showed no evidence of focal opacities/consolidation, pnemothorax or  pleural effusion. Two small pulmonary nodules with the largest measuring 46m along the fissure in the left lung.   PSG 02/2017 showed AHI of 5/hour with total sleep time of 5 hours,  she slept supine for 1 hour with AHI of 25/hour. Lowest desaturation was 76%, she spent 195 minutes with saturation less than 88%.   PSG in 08/2011>>AHI of 22/hour, supine AHI was 61/hour. CPAP was titrated to 7 cm  HST12/2018 >>moderate OSA with 21 events per hour.  CT scan from 04/2015 and chest x-ray from 07/2015 showed elevated right hemidiaphragm. Sniff test 10/2017 normal movement of BL diaphragms  10/2017 esophagram - normal  ONO 02/2018 neg on CPAP -no need for O2   Assessment and Plan:  Acute bronchitis: - Dry cough with associated shortness of breath x 3 weeks - Recommend delsym cough syrup twice daily; Mucinex BID prn chest congestion  - Trial Breo 100 one puff once daily (sample) - RX Albuterol rescue inhaler 2 puffs every 4-6 hours as needed for sob/wheezing - Needs full PFTs  Follow Up Instructions:   2-4 weeks with NP  I discussed the assessment and treatment plan with the patient. The patient was provided an opportunity to ask questions and all were answered. The patient agreed with the plan and demonstrated an understanding of the instructions.   The patient was advised to call back or seek an in-person evaluation if the symptoms worsen or if the condition fails to improve as anticipated.  I provided 20 minutes of non-face-to-face time during this encounter.   EMartyn Ehrich NP

## 2019-10-19 NOTE — Telephone Encounter (Signed)
[facility] returned call with pt's covid test results. Pt was tested 10/2 and the results were negative. Results stated to [facility]. Nothing further needed.

## 2019-10-19 NOTE — Telephone Encounter (Signed)
Call returned to patient, confirmed DOB. She reports increased increased SOB and increasing dry cough. She has been taking mucinex but it has not been very helpful. She also reports nasal congestion and chest congestion. She uses her nasal spray bid and takes allegra and despite these she sis still having some increased SOB. She reports these symptoms for 3 weeks that have slowly gotten worse. Tele-visit appt made.   Nothing further needed at this time.

## 2019-10-19 NOTE — Telephone Encounter (Signed)
Pt had televisit with Beth today. Per Eustaquio Maize, pt needs to be seen at office within 2-4 weeks but we need to know if pt has had a covid test and the results of that test.  Pt told Beth that she had a covid test done at PCP. Attempted to call pt's PCP Dr. Brigitte Pulse to speak with his nurse but unable to reach her. Left a detailed message for her to return call.

## 2019-10-22 ENCOUNTER — Telehealth: Payer: Self-pay | Admitting: Primary Care

## 2019-10-22 DIAGNOSIS — R0602 Shortness of breath: Secondary | ICD-10-CM

## 2019-10-22 DIAGNOSIS — H04121 Dry eye syndrome of right lacrimal gland: Secondary | ICD-10-CM | POA: Diagnosis not present

## 2019-10-22 DIAGNOSIS — J209 Acute bronchitis, unspecified: Secondary | ICD-10-CM

## 2019-10-22 DIAGNOSIS — H04122 Dry eye syndrome of left lacrimal gland: Secondary | ICD-10-CM | POA: Diagnosis not present

## 2019-10-22 MED ORDER — ALBUTEROL SULFATE HFA 108 (90 BASE) MCG/ACT IN AERS: 2.0000 | INHALATION_SPRAY | RESPIRATORY_TRACT | 5 refills | Status: DC | PRN

## 2019-10-22 NOTE — Telephone Encounter (Signed)
I would not have endoscopy if having active symptoms.

## 2019-10-22 NOTE — Telephone Encounter (Signed)
Call returned to patient, confirmed DOB, medication, and pharmacy. Refill sent.  Patient states she is scheduled for a Endoscopy and she is wanting to know if she should do it or hold off until her breathing is better. Requesting recommendations. She states she forgot to ask during her visit a few days ago.   Beth please advise. Thanks.

## 2019-10-23 MED ORDER — BREO ELLIPTA 200-25 MCG/INH IN AEPB: 1.0000 | INHALATION_SPRAY | Freq: Every day | RESPIRATORY_TRACT | 0 refills | Status: DC

## 2019-10-23 MED ORDER — PREDNISONE 10 MG PO TABS: ORAL_TABLET | ORAL | 0 refills | Status: DC

## 2019-10-23 NOTE — Telephone Encounter (Signed)
Sent!

## 2019-10-23 NOTE — Telephone Encounter (Signed)
I can send in prednisone course which I know she has improved with in the past. We can also try increasing BREO to 200.

## 2019-10-23 NOTE — Telephone Encounter (Signed)
Called and spoke with Kim Chavez letting her know that Kim Chavez said that she would not have the endoscopy if having active symptoms and Kim Chavez verbalized understanding.  While speaking with Kim Chavez, Kim Chavez said that she is unsure if the [facility] or the Albuterol inhalers are working for her due to still having problems with her breathing. Kim Chavez said she did have to use her rescue inhaler last night and today just has not been a good day for her.  Kim Chavez is wanting to know if there is something else that might be able to be done or what we recommend. Beth, please advise. Thanks!

## 2019-10-23 NOTE — Telephone Encounter (Signed)
Message below routed back to Derl Barrow, NP:  [name], I called the patient back and made her aware that prednisone can be sent to pharmacy and of the [facility] recommendation.   Patient stated she would not be able to afford it because it is $300. Was able to get a sample for the patient. I told her I would print out some contact information from the manufacturer, because she may be able to get assistance to offset the cost of the medication.  Patient agreed to take a look at the information provided.  Can you send in the prednisone to the [facility] [address]? (confirmed what we have listed is correct)

## 2019-10-25 ENCOUNTER — Other Ambulatory Visit: Payer: Medicare Other

## 2019-10-26 ENCOUNTER — Other Ambulatory Visit (HOSPITAL_COMMUNITY)
Admission: RE | Admit: 2019-10-26 | Discharge: 2019-10-26 | Disposition: A | Discharge status: Home / Self Care | Payer: Medicare Other | Source: Ambulatory Visit | Attending: Primary Care | Admitting: Primary Care

## 2019-10-26 DIAGNOSIS — Z20828 Contact with and (suspected) exposure to other viral communicable diseases: Secondary | ICD-10-CM | POA: Diagnosis not present

## 2019-10-26 DIAGNOSIS — Z01812 Encounter for preprocedural laboratory examination: Secondary | ICD-10-CM | POA: Insufficient documentation

## 2019-10-28 LAB — NOVEL CORONAVIRUS, NAA (HOSP ORDER, SEND-OUT TO REF LAB; TAT 18-24 HRS): SARS-CoV-2, NAA: NOT DETECTED

## 2019-10-30 ENCOUNTER — Ambulatory Visit (HOSPITAL_COMMUNITY)
Admission: RE | Admit: 2019-10-30 | Discharge: 2019-10-30 | Disposition: A | Discharge status: Home / Self Care | Payer: Medicare Other | Source: Ambulatory Visit | Attending: Primary Care | Admitting: Primary Care

## 2019-10-30 ENCOUNTER — Other Ambulatory Visit: Payer: Self-pay

## 2019-10-30 DIAGNOSIS — R0602 Shortness of breath: Secondary | ICD-10-CM | POA: Diagnosis not present

## 2019-10-30 LAB — PULMONARY FUNCTION TEST
DL/VA % pred: 97 %
DL/VA: 4.06 ml/min/mmHg/L
DLCO unc % pred: 106 %
DLCO unc: 19.84 ml/min/mmHg
FEF 25-75 Post: 3.99 L/sec
FEF 25-75 Pre: 3.92 L/sec
FEF2575-%Change-Post: 1 %
FEF2575-%Pred-Post: 234 %
FEF2575-%Pred-Pre: 230 %
FEV1-%Change-Post: 0 %
FEV1-%Pred-Post: 143 %
FEV1-%Pred-Pre: 143 %
FEV1-Post: 2.99 L
FEV1-Pre: 2.99 L
FEV1FVC-%Change-Post: 0 %
FEV1FVC-%Pred-Pre: 113 %
FEV6-%Change-Post: -1 %
FEV6-%Pred-Post: 130 %
FEV6-%Pred-Pre: 132 %
FEV6-Post: 3.43 L
FEV6-Pre: 3.49 L
FEV6FVC-%Change-Post: 0 %
FEV6FVC-%Pred-Post: 104 %
FEV6FVC-%Pred-Pre: 105 %
FVC-%Change-Post: -1 %
FVC-%Pred-Post: 124 %
FVC-%Pred-Pre: 126 %
FVC-Post: 3.45 L
FVC-Pre: 3.49 L
Post FEV1/FVC ratio: 87 %
Post FEV6/FVC ratio: 99 %
Pre FEV1/FVC ratio: 86 %
Pre FEV6/FVC Ratio: 100 %
RV % pred: 119 %
RV: 2.64 L
TLC % pred: 127 %
TLC: 6.26 L

## 2019-10-30 MED ORDER — ALBUTEROL SULFATE (2.5 MG/3ML) 0.083% IN NEBU
2.5000 mg | INHALATION_SOLUTION | Freq: Once | RESPIRATORY_TRACT | Status: AC
  Administered 2019-10-30: 2.5 mg via RESPIRATORY_TRACT

## 2019-11-05 ENCOUNTER — Encounter: Payer: Self-pay | Admitting: Acute Care

## 2019-11-05 ENCOUNTER — Ambulatory Visit: Payer: Medicare Other | Admitting: Primary Care

## 2019-11-05 ENCOUNTER — Other Ambulatory Visit: Payer: Self-pay

## 2019-11-05 ENCOUNTER — Ambulatory Visit (INDEPENDENT_AMBULATORY_CARE_PROVIDER_SITE_OTHER): Payer: Medicare Other | Admitting: Acute Care

## 2019-11-05 VITALS — BP 124/64 | HR 124 | Temp 97.8°F | Ht 63.0 in | Wt 146.6 lb

## 2019-11-05 DIAGNOSIS — J410 Simple chronic bronchitis: Secondary | ICD-10-CM | POA: Diagnosis not present

## 2019-11-05 DIAGNOSIS — J42 Unspecified chronic bronchitis: Secondary | ICD-10-CM

## 2019-11-05 DIAGNOSIS — D72829 Elevated white blood cell count, unspecified: Secondary | ICD-10-CM

## 2019-11-05 DIAGNOSIS — J209 Acute bronchitis, unspecified: Secondary | ICD-10-CM

## 2019-11-05 DIAGNOSIS — E039 Hypothyroidism, unspecified: Secondary | ICD-10-CM | POA: Diagnosis not present

## 2019-11-05 DIAGNOSIS — R0602 Shortness of breath: Secondary | ICD-10-CM | POA: Diagnosis not present

## 2019-11-05 DIAGNOSIS — G4733 Obstructive sleep apnea (adult) (pediatric): Secondary | ICD-10-CM | POA: Diagnosis not present

## 2019-11-05 DIAGNOSIS — E041 Nontoxic single thyroid nodule: Secondary | ICD-10-CM | POA: Diagnosis not present

## 2019-11-05 LAB — CBC WITH DIFFERENTIAL/PLATELET
Basophils Absolute: 0.1 10*3/uL (ref 0.0–0.1)
Basophils Relative: 0.6 % (ref 0.0–3.0)
Eosinophils Absolute: 0.1 10*3/uL (ref 0.0–0.7)
Eosinophils Relative: 0.8 % (ref 0.0–5.0)
HCT: 44.2 % (ref 36.0–46.0)
Hemoglobin: 14.6 g/dL (ref 12.0–15.0)
Lymphocytes Relative: 10.8 % — ABNORMAL LOW (ref 12.0–46.0)
Lymphs Abs: 1.7 10*3/uL (ref 0.7–4.0)
MCHC: 33.1 g/dL (ref 30.0–36.0)
MCV: 85 fl (ref 78.0–100.0)
Monocytes Absolute: 1.1 10*3/uL — ABNORMAL HIGH (ref 0.1–1.0)
Monocytes Relative: 6.8 % (ref 3.0–12.0)
Neutro Abs: 12.8 10*3/uL — ABNORMAL HIGH (ref 1.4–7.7)
Neutrophils Relative %: 81 % — ABNORMAL HIGH (ref 43.0–77.0)
Platelets: 327 10*3/uL (ref 150.0–400.0)
RBC: 5.2 Mil/uL — ABNORMAL HIGH (ref 3.87–5.11)
RDW: 15.5 % (ref 11.5–15.5)
WBC: 15.9 10*3/uL — ABNORMAL HIGH (ref 4.0–10.5)

## 2019-11-05 MED ORDER — MONTELUKAST SODIUM 10 MG PO TABS: 10.0000 mg | ORAL_TABLET | Freq: Every day | ORAL | 5 refills | Status: DC

## 2019-11-05 MED ORDER — BREO ELLIPTA 200-25 MCG/INH IN AEPB: 1.0000 | INHALATION_SPRAY | Freq: Every day | RESPIRATORY_TRACT | 0 refills | Status: DC

## 2019-11-05 MED ORDER — ALBUTEROL SULFATE (2.5 MG/3ML) 0.083% IN NEBU: 2.5000 mg | INHALATION_SOLUTION | Freq: Four times a day (QID) | RESPIRATORY_TRACT | 3 refills | Status: DC

## 2019-11-05 MED ORDER — IPRATROPIUM BROMIDE 0.02 % IN SOLN: 0.5000 mg | Freq: Two times a day (BID) | RESPIRATORY_TRACT | 3 refills | Status: DC

## 2019-11-05 NOTE — Patient Instructions (Addendum)
It is good to see you today. We will order nebulizer treatments ( ipatropiam and albuterol) Use as needed for shortness of breath or wheezing. Try taking in the morning and in the evening. We will order a neb machine today ( [place]) Please try Chlorpheniramine 4 mg at bedtime to see if this helps with your post nasal gtt.  If you use this with Ambien, please be aware that used together they can cause sleepiness and drowsiness and dizziness. Don't drive if sleepy. Continue Allegra as you have been doing. We will prescribe Singulair 10 mg at bedtime  Let us know if your insurance does not cover this.  CBC with Diff today We will call you with the results. Continue wearing your CPAP at bedtime Continue on CPAP at bedtime. You appear to be benefiting from the treatment  Goal is to wear for at least 6 hours each night for maximal clinical benefit. Continue to work on weight loss, as the link between excess weight  and sleep apnea is well established.   Remember to establish a good bedtime routine, and work on sleep hygiene.  Limit daytime naps , avoid stimulants such as caffeine and nicotine close to bedtime, exercise daily to promote sleep quality, avoid heavy , spicy, fried , or rich foods before bed. Ensure adequate exposure to natural light during the day,establish a relaxing bedtime routine with a pleasant sleep environment ( Bedroom between 60 and 67 degrees, turn off bright lights , TV or device screens screens , consider black out curtains or white noise machines) Do not drive if sleepy. Remember to clean mask, tubing, filter, and reservoir once weekly with soapy water.  Follow up with Elsworth Soho   In 6 months  or before as needed.     Sips of water instead of throat clearing Sugar Free [employer] or [employer] originals for throat soothing. Delsym Cough syrup 5 cc's every 12 hours We will give you a copy of the GERD diet.  Down Load the My Chart App for future video  visits

## 2019-11-05 NOTE — Assessment & Plan Note (Addendum)
Good compliance and control Benefit pf treatment Plan Continue wearing your CPAP at bedtime Continue on CPAP at bedtime. You appear to be benefiting from the treatment  Goal is to wear for at least 6 hours each night for maximal clinical benefit. Continue to work on weight loss, as the link between excess weight  and sleep apnea is well established.   Remember to establish a good bedtime routine, and work on sleep hygiene.  Limit daytime naps , avoid stimulants such as caffeine and nicotine close to bedtime, exercise daily to promote sleep quality, avoid heavy , spicy, fried , or rich foods before bed. Ensure adequate exposure to natural light during the day,establish a relaxing bedtime routine with a pleasant sleep environment ( Bedroom between 60 and 67 degrees, turn off bright lights , TV or device screens  , consider black out curtains or white noise machines) Do not drive if sleepy. Remember to clean mask, tubing, filter, and reservoir once weekly with soapy water.  Follow up with Elsworth Soho   In 6 months  or before as needed.

## 2019-11-05 NOTE — Progress Notes (Addendum)
History of Present Illness Kim Chavez is a 73 y.o. female with   Synopsis 73 year old female, former smoker in 1976 (5 pack year hx). PMH significant for OSA, HTN, GERD, Crohn's, hypothyroidism, HLD, breast cancer. Patient of Dr. Elsworth Soho followed for sleep apnea, last seen on 08/21/18 for regular office visit. Maintained on CPAP 5-10cm h20.    11/05/2019  Pt presents for follow up. She was seen by Derl Barrow NP 10/19/2019 for  An acute episode of bronchitis. She was treated previously by her PCP with Doxycycline and taper twice. She was started on Breo and Delsym for cough.  She states she was compliant with her Memory Dance, but it is too expensive for her to take. She would rather have nebs treatments, and a nebulizer machine.. She is complaining of post nasal drip. She states she cannot take Mucinex. She states it has been upsetting her stomach.. She is using Allegra and she is using  Flonase daily. She is noting her sinuses drain more at bedtime.She is compliant with her CPAP at bedtime. Her Down Load looks great. It shows great compliance.We have reviewed cough therapies, and reflux diet suggestions..  Test Results:  Down Load 10/06/2019-11/04/2019 Air sense 10 AutoSet 5-10 cm H2O 100 % usage > 4 hours 93% of the time Median pressure 8.3 cm H2O Maximum pressure 9.8 cm H2O AHI 3.5 ( Central 0.5, Obstructive 1.9)  CT chest on 10/09/19 showed no evidence of focal opacities/consolidation, pnemothorax or  pleural effusion. Two small pulmonary nodules with the largest measuring 87m along the fissure in the left lung.   PSG 02/2017 showed AHI of 5/hour with total sleep time of 5 hours, she slept supine for 1 hour with AHI of 25/hour. Lowest desaturation was 76%, she spent 195 minutes with saturation less than 88%.   PSG in 08/2011>>AHI of 22/hour, supine AHI was 61/hour. CPAP was titrated to 7 cm  HST12/2018 >>moderate OSA with 21 events per hour.  CT scan from 04/2015 and chest  x-ray from 07/2015 showed elevated right hemidiaphragm. Sniff test 10/2017 normal movement of BL diaphragms  10/2017 esophagram - normal  ONO 02/2018 neg on CPAP -no need for O2  CBC Latest Ref Rng & Units 11/16/2018 11/15/2018 12/02/2017  WBC 4.0 - 10.5 K/uL 4.9 8.9 9.2  Hemoglobin 12.0 - 15.0 g/dL 13.0 15.3(H) 13.6  Hematocrit 36.0 - 46.0 % 40.6 47.3(H) 40.9  Platelets 150 - 400 K/uL 280 320 281    BMP Latest Ref Rng & Units 11/16/2018 11/15/2018 12/02/2017  Glucose 70 - 99 mg/dL 113(H) 100(H) 103(H)  BUN 8 - 23 mg/dL 12 13 11   Creatinine 0.44 - 1.00 mg/dL 1.03(H) 1.36(H) 1.08(H)  Sodium 135 - 145 mmol/L 141 141 137  Potassium 3.5 - 5.1 mmol/L 3.6 3.1(L) 3.3(L)  Chloride 98 - 111 mmol/L 107 100 101  CO2 22 - 32 mmol/L 25 25 27   Calcium 8.9 - 10.3 mg/dL 8.9 10.0 9.3    BNP    Component Value Date/Time   BNP 57.9 05/01/2015 0835    ProBNP No results found for: PROBNP  PFT    Component Value Date/Time   FEV1PRE 2.99 10/30/2019 1408   FEV1POST 2.99 10/30/2019 1408   FVCPRE 3.49 10/30/2019 1408   FVCPOST 3.45 10/30/2019 1408   TLC 6.26 10/30/2019 1408   DLCOUNC 19.84 10/30/2019 1408   PREFEV1FVCRT 86 10/30/2019 1408   PSTFEV1FVCRT 87 10/30/2019 1408    No results found.   Past medical hx Past Medical History:  Diagnosis Date   Anxiety    Breast cancer ([place]) 2011   BILATERAL BREAST REMOVED   Depression    Fibromyalgia    GERD (gastroesophageal reflux disease)    Hypertension    Hypothyroidism    Migraine    MIGRAINES   Overactive bladder    Pneumonia    several times   RUQ pain    Sleep apnea    uses c-pap machine   Thyroid disease      Social History   Tobacco Use   Smoking status: Former Smoker    Packs/day: 1.00    Years: 5.00    Pack years: 5.00    Types: Cigarettes    Quit date: 09/12/1975    Years since quitting: 44.1   Smokeless tobacco: Never Used  Substance Use Topics   Alcohol use: No   Drug use: No     Ms.Date reports that she quit smoking about 44 years ago. Her smoking use included cigarettes. She has a 5.00 pack-year smoking history. She has never used smokeless tobacco. She reports that she does not drink alcohol or use drugs.  Tobacco Cessation: Former smoker quit 1976 with a 5 pack year smoking history  Past surgical hx, Family hx, Social hx all reviewed.  Current Outpatient Medications on File Prior to Visit  Medication Sig   albuterol (VENTOLIN HFA) 108 (90 Base) MCG/ACT inhaler Inhale 2 puffs into the lungs every 4 (four) hours as needed for wheezing or shortness of breath.   Cholecalciferol (VITAMIN D3) 50 MCG (2000 UT) capsule Take 2,000 Units by mouth daily.    Coenzyme Q10 (CO Q-10) 100 MG CAPS Take 200 mg by mouth every morning.    cyclobenzaprine (FLEXERIL) 10 MG tablet TAKE 1 2 TO 1 (ONE HALF TO ONE) TABLET BY MOUTH EVERY 8 HOURS AS NEEDED FOR MUSCLE SPASM   famotidine (PEPCID) 20 MG tablet Take 20 mg by mouth 2 (two) times daily.   fexofenadine (ALLEGRA) 180 MG tablet Take 180 mg by mouth daily.   fluticasone (FLONASE) 50 MCG/ACT nasal spray Place 2 sprays into both nostrils daily as needed for allergies.    guaiFENesin (MUCINEX) 600 MG 12 hr tablet Take by mouth.   HYDROcodone-acetaminophen (NORCO/VICODIN) 5-325 MG tablet Take 1 tablet by mouth every 6 (six) hours as needed for moderate pain or severe pain.    levothyroxine (SYNTHROID, LEVOTHROID) 50 MCG tablet Take 50 mcg by mouth daily before breakfast.   NON FORMULARY CPAP: At bedtime (nightly)   omeprazole (PRILOSEC) 20 MG capsule Take 40 mg by mouth at bedtime.    predniSONE (DELTASONE) 10 MG tablet Take 4 tabs po daily x 3 days; then 3 tabs daily x3 days; then 2 tabs daily x3 days; then 1 tab daily x 3 days; then stop   Probiotic Product (PROBIOTIC DAILY PO) Take 4 tablets by mouth every morning. gummie   simvastatin (ZOCOR) 40 MG tablet Take 1 tablet by mouth at bedtime.   zolpidem  (AMBIEN) 10 MG tablet Take 10 mg by mouth at bedtime.    No current facility-administered medications on file prior to visit.      Allergies  Allergen Reactions   Aspirin Other (See Comments)    Almost caused ulcers Almost caused ulcers   Erythromycin Nausea And Vomiting   Ibuprofen Other (See Comments)    Has Crohn's and was instructed to NOT take Has Crohn's and was instructed to NOT take   Tape Rash    Plastic  Codeine Nausea And Vomiting    'Deathly sick on stomach"    Other Diarrhea    Cramping also- Nothing with seeds/has Crohn's!!   Penicillins Swelling    Arms became swollen Has patient had a PCN reaction causing immediate rash, facial/tongue/throat swelling, SOB or lightheadedness with hypotension: Yes Has patient had a PCN reaction causing severe rash involving mucus membranes or skin necrosis: No Has patient had a PCN reaction that required hospitalization: No Has patient had a PCN reaction occurring within the last 10 years: No If all of the above answers are "NO", then may proceed with Cephalosporin use.    Neosporin [Neomycin-Bacitracin Zn-Polymyx] Itching, Rash and Other (See Comments)    Turns red around the site and itches like crazy for about two weeks.     Review Of Systems:  Constitutional:   No  weight loss, night sweats,  Fevers, chills, fatigue, or  lassitude.  HEENT:   No headaches,  Difficulty swallowing,  Tooth/dental problems, or  Sore throat,                No sneezing, itching, ear ache, nasal congestion, post nasal drip,   CV:  No chest pain,  Orthopnea, PND, swelling in lower extremities, anasarca, dizziness, palpitations, syncope.   GI  No heartburn, indigestion, abdominal pain, nausea, vomiting, diarrhea, change in bowel habits, loss of appetite, bloody stools.   Resp: baseline shortness of breath with exertion none  at rest.  No excess mucus, no productive cough,  + non-productive cough,  No coughing up of blood.  No change in  color of mucus.  No wheezing.  No chest wall deformity  Skin: no rash or lesions.  GU: no dysuria, change in color of urine, no urgency or frequency.  No flank pain, no hematuria   MS:  No joint pain or swelling.  No decreased range of motion.  No back pain.  Psych:  No change in mood or affect. No depression or anxiety.  No memory loss.   Vital Signs BP 124/64 (BP Location: Right Arm, Cuff Size: Normal)    Pulse (!) 124    Temp 97.8 F (36.6 C) (Oral)    Ht 5' 3"  (1.6 m)    Wt 146 lb 9.6 oz (66.5 kg)    SpO2 98%    BMI 25.97 kg/m    Physical Exam:  General- No distress,  A&Ox3, pleasant  ENT: No sinus tenderness, TM clear, pale nasal mucosa, no oral exudate,no post nasal drip, no LAN Cardiac: S1, S2, regular rate and rhythm, no murmur Chest: No wheeze/ rales/ dullness; no accessory muscle use, no nasal flaring, no sternal retractions Abd.: Soft Non-tender, ND, BS +, Body mass index is 25.97 kg/m. Ext: No clubbing cyanosis, edema Neuro:  normal strength, MAE x 4, A&O x 3 Skin: No rashes, warm and dry Psych: normal mood and behavior   Assessment/Plan  Simple chronic bronchitis (HCC) ? Possible allergic trigger PFT's without obstruction Multiple treatments with Doxy and Prednisone  Cannot afford Breo, would like nebs ordered Plan We will order nebulizer treatments ( ipatropiam and albuterol) Use once in the morning and once in the evening Use as needed for shortness of breath or wheezing. Try taking in the morning and in the evening. We will order a neb machine today ( [place]) Please try Chlorpheniramine 4 mg at bedtime to see if this helps with your post nasal gtt.  If you use this with Ambien, please be aware that used  together they can cause sleepiness and drowsiness and dizziness. Don't drive if sleepy. Continue Allegra as you have been doing. We will prescribe Singulair 10 mg at bedtime  Let us know if your insurance does not cover this.  CBC with  Diff today We will call you with the results. Follow up as needed Please contact office for sooner follow up if symptoms do not improve or worsen or seek emergency care    OSA (obstructive sleep apnea) Good compliance and control Benefit pf treatment Plan Continue wearing your CPAP at bedtime Continue on CPAP at bedtime. You appear to be benefiting from the treatment  Goal is to wear for at least 6 hours each night for maximal clinical benefit. Continue to work on weight loss, as the link between excess weight  and sleep apnea is well established.   Remember to establish a good bedtime routine, and work on sleep hygiene.  Limit daytime naps , avoid stimulants such as caffeine and nicotine close to bedtime, exercise daily to promote sleep quality, avoid heavy , spicy, fried , or rich foods before bed. Ensure adequate exposure to natural light during the day,establish a relaxing bedtime routine with a pleasant sleep environment ( Bedroom between 60 and 67 degrees, turn off bright lights , TV or device screens  , consider black out curtains or white noise machines) Do not drive if sleepy. Remember to clean mask, tubing, filter, and reservoir once weekly with soapy water.  Follow up with Elsworth Soho   In 6 months  or before as needed.      Cough Sips of water instead of throat clearing Sugar Free [employer] or [employer] originals for throat soothing. Delsym Cough syrup 5 cc's every 12 hours Non-sedating antihistamine of your choice daily ( Zyrtec, Allegra, Xyzol, Claritin ( Generic ok) GERD diet Delsym every 12 hours as needed for cough We will give you some samples today.  This appointment was 40  min long with over 50% of the time in direct face-to-face patient care, assessment, plan of care, and follow-up.   Magdalen Spatz, NP 11/05/2019  12:39 PM

## 2019-11-05 NOTE — Assessment & Plan Note (Signed)
?   Possible allergic trigger PFT's without obstruction Multiple treatments with Doxy and Prednisone  Cannot afford Breo, would like nebs ordered Plan We will order nebulizer treatments ( ipatropiam and albuterol) Use once in the morning and once in the evening Use as needed for shortness of breath or wheezing. Try taking in the morning and in the evening. We will order a neb machine today ( [employer]) Please try Chlorpheniramine 4 mg at bedtime to see if this helps with your post nasal gtt.  If you use this with Ambien, please be aware that used together they can cause sleepiness and drowsiness and dizziness. Don't drive if sleepy. Continue Allegra as you have been doing. We will prescribe Singulair 10 mg at bedtime  Let us know if your insurance does not cover this.  CBC with Diff today We will call you with the results. Follow up as needed Please contact office for sooner follow up if symptoms do not improve or worsen or seek emergency care

## 2019-11-07 NOTE — Addendum Note (Signed)
Addended by: Jannette Spanner on: 11/07/2019 09:31 AM   Modules accepted: Orders

## 2019-11-09 DIAGNOSIS — N390 Urinary tract infection, site not specified: Secondary | ICD-10-CM | POA: Diagnosis not present

## 2019-11-13 ENCOUNTER — Other Ambulatory Visit: Payer: Self-pay

## 2019-11-13 ENCOUNTER — Ambulatory Visit (INDEPENDENT_AMBULATORY_CARE_PROVIDER_SITE_OTHER): Payer: Medicare Other | Admitting: Primary Care

## 2019-11-13 ENCOUNTER — Telehealth: Payer: Self-pay | Admitting: Pulmonary Disease

## 2019-11-13 ENCOUNTER — Encounter: Payer: Self-pay | Admitting: Primary Care

## 2019-11-13 DIAGNOSIS — J42 Unspecified chronic bronchitis: Secondary | ICD-10-CM

## 2019-11-13 MED ORDER — GUAIFENESIN ER 600 MG PO TB12: 1200.0000 mg | ORAL_TABLET | Freq: Two times a day (BID) | ORAL | 1 refills | Status: DC

## 2019-11-13 MED ORDER — LEVALBUTEROL HCL 0.63 MG/3ML IN NEBU: 0.6300 mg | INHALATION_SOLUTION | Freq: Three times a day (TID) | RESPIRATORY_TRACT | 6 refills | Status: DC | PRN

## 2019-11-13 NOTE — Progress Notes (Addendum)
Virtual Visit via Telephone Note  I connected with Kim Chavez on 11/13/19 at  2:00 PM EST by telephone and verified that I am speaking with the correct person using two identifiers.  Location: Patient: Home Provider: Office   I discussed the limitations, risks, security and privacy concerns of performing an evaluation and management service by telephone and the availability of in person appointments. I also discussed with the patient that there may be a patient responsible charge related to this service. The patient expressed understanding and agreed to proceed.   History of Present Illness: 73 year old female, former smoker quit 1976 (5 pack years). PMH significant for OSA, sleep related hypoventilation, chronic bronchitis, HTN, Crohn's, hypothyroidism, breast cancer. PFTs 10/30/19 showed hyperinflation without obstruction. No bronchodilator response. Given prescription for Atrovent and Albuterol nebulizer. Maintained on CPAP 5-10cm H20.   11/13/2019 Patient contacted today for acute televisit. Reports jitteriness and feeling that her heart is racing after using albuterol nebulizer. This does not happen as much with Atrovent. States that her breathing seems to be ok. Feels the nebulized medications have helped her shortness of breath a good deal. She does have a congested cough with little to no production. Denies chest pain or active shortness of breath.    Observations/Objective:  - No significant shortness of breath or wheezing noted during phone call - Congested cough   Imaging: CT chest on 10/09/19 showed no evidence of focal opacities/consolidation, pnemothorax or pleural effusion. Two small pulmonary nodules with the largest measuring 28m along the fissure in the left lung.   Assessment and Plan:  Chronic bronchitis - PFTs showed hyperinflation with no obstruction - Breathing is stable, has congested np cough - Continue Atrovent nebulizer twice daily - Change Albuterol  to Levalbuterol twice daily d/t tachycardia  - Recommend mucinex 1,2065mtwice daily   Follow Up Instructions:   - If symptoms do not improve or worsen  I discussed the assessment and treatment plan with the patient. The patient was provided an opportunity to ask questions and all were answered. The patient agreed with the plan and demonstrated an understanding of the instructions.   The patient was advised to call back or seek an in-person evaluation if the symptoms worsen or if the condition fails to improve as anticipated.  I provided 22 minutes of non-face-to-face time during this encounter.   ElMartyn EhrichNP

## 2019-11-13 NOTE — Patient Instructions (Addendum)
Recommendations: Change Albuterol to Levalbuterol (xopenex)  Take mucinex 1,228m twice daily for chest congestion  Follow-up: Labs in 1-2 weeks 6 months with Dr. AElsworth Soho

## 2019-11-13 NOTE — Telephone Encounter (Signed)
All returned to patient, confirmed DOB, she states after using her albuterol nebulizer machine her heart is racing. I inquired as to whether this was her first time using it, she states it happens everytime she uses it. She reports she started it last week and each time it seems to get worse. I made her aware to D/C for now until she hears back from [place]. She reports she almost had her husband take her to the ED last night because it felt like her heart was going to "literally jump out of my chest." She states it was very scary. She states it does help her SOB but just makes her heart feel "weird."  Tele-visit appt made.   Nothing further needed at this time.

## 2019-11-14 ENCOUNTER — Telehealth: Payer: Self-pay | Admitting: Primary Care

## 2019-11-14 MED ORDER — LEVALBUTEROL HCL 0.63 MG/3ML IN NEBU: 0.6300 mg | INHALATION_SOLUTION | Freq: Three times a day (TID) | RESPIRATORY_TRACT | 6 refills | Status: DC | PRN

## 2019-11-14 MED ORDER — GUAIFENESIN ER 600 MG PO TB12: 1200.0000 mg | ORAL_TABLET | Freq: Two times a day (BID) | ORAL | 1 refills | Status: DC

## 2019-11-14 NOTE — Telephone Encounter (Signed)
Spoke with patient.  Nebulizer medication was sent in.  Patient states Beth told her she was going to call in a delsym cough medication that was "2x strong than what over the counter is "  Also patient wants to know if there is any alternative to the ipratropium she is having a little mouth irritation and she states she is rinsing her mouth out very well.   [facility] [address] Beth please advise.

## 2019-11-14 NOTE — Telephone Encounter (Signed)
No alternative to Atrovent in nebulizer form. She could try Spiriva respimat   Yes Mucinex was the cough medication I was talking about- it is an expectorant and will help thin mucus

## 2019-11-14 NOTE — Telephone Encounter (Signed)
Spoke with the pt  She states will keep trying the atrovent for now  She does not want to try spiriva bc she feels it would be too expensive  She is aware that the mucinex is otc  Nothing further needed

## 2019-11-14 NOTE — Telephone Encounter (Signed)
It was sent to [facility]

## 2019-11-14 NOTE — Addendum Note (Signed)
Addended by: Martyn Ehrich on: 11/14/2019 03:26 PM   Modules accepted: Orders

## 2019-11-15 ENCOUNTER — Telehealth: Payer: Self-pay | Admitting: Primary Care

## 2019-11-15 MED ORDER — LEVALBUTEROL HCL 0.63 MG/3ML IN NEBU: 0.6300 mg | INHALATION_SOLUTION | Freq: Three times a day (TID) | RESPIRATORY_TRACT | 6 refills | Status: DC | PRN

## 2019-11-15 NOTE — Telephone Encounter (Signed)
Call returned to patient, confirmed DOB, she states she was supposed to have xopenex nebs sent in. I made her aware they were sent to [facility]. She states they were supposed to be sent to [facility] for a cheaper price. I made her aware I would send the medications.   Voiced understanding.   Nothing further needed at this time.

## 2019-11-16 ENCOUNTER — Telehealth: Payer: Self-pay | Admitting: Primary Care

## 2019-11-16 MED ORDER — LEVALBUTEROL HCL 0.63 MG/3ML IN NEBU: 0.6300 mg | INHALATION_SOLUTION | Freq: Three times a day (TID) | RESPIRATORY_TRACT | 6 refills | Status: DC | PRN

## 2019-11-16 NOTE — Telephone Encounter (Signed)
Called the patient to let her know the prescription was sent to the pharmacy. Patient stated she was told we needed to provide the information.  Called [facility] and gave the diagnosis code and read off the information on the patient insurance cards just in case they did not have it available. They confirmed they received the updated prescription that was sent earlier today and will try to get the prescription to process and ready for patient pick up.  Called the patient back to advise. Nothing further needed.

## 2019-11-16 NOTE — Telephone Encounter (Signed)
Called and spoke with pt letting her know that I would send the Rx for the neb meds to her local pharmacy and she verbalized understanding. Verified pt's preferred pharmacy and sent Rx for pt.nothing further needed.

## 2019-11-20 ENCOUNTER — Telehealth: Payer: Self-pay | Admitting: Primary Care

## 2019-11-20 DIAGNOSIS — J449 Chronic obstructive pulmonary disease, unspecified: Secondary | ICD-10-CM

## 2019-11-20 NOTE — Telephone Encounter (Signed)
Left message with Dr. Earlean Shawl office, from pulmonary standpoint she is clear to have endoscopy. She has a history of moderate OSA and is compliant with CPAP use and chronic bronchitis with essentially normal PFTs. She is not on oxygen and uses SABA as needed. Will check CXR d.t cough.   [facility] download 11/8/-11/19/19: Usage 30/30 days Pressure 5-10cm h20 AHI 2.5

## 2019-11-20 NOTE — Telephone Encounter (Signed)
Can we get a download to make sure she is wearing her CPAP first?

## 2019-11-20 NOTE — Telephone Encounter (Signed)
Spoke with pt, requesting that we send her most recent PFT report to Dr. Richmond Campbell.  This has been sent. Pt is also requesting that Derl Barrow call Dr. Earlean Shawl directly at [phone].  States that she is not currently scheduled for an endoscopy but is needing one.  I asked why she felt like she needed one, states that she is "passing blood" and that Dr. Earlean Shawl is aware of this.    Beth please advise if you can call Dr. Earlean Shawl.  Thanks!

## 2019-11-20 NOTE — Telephone Encounter (Signed)
Download has been printed from [facility] and given to [place] for review.

## 2019-11-21 ENCOUNTER — Telehealth: Payer: Self-pay | Admitting: Pulmonary Disease

## 2019-11-21 ENCOUNTER — Ambulatory Visit (INDEPENDENT_AMBULATORY_CARE_PROVIDER_SITE_OTHER): Payer: Medicare Other

## 2019-11-21 ENCOUNTER — Other Ambulatory Visit (INDEPENDENT_AMBULATORY_CARE_PROVIDER_SITE_OTHER): Payer: Medicare Other

## 2019-11-21 DIAGNOSIS — J449 Chronic obstructive pulmonary disease, unspecified: Secondary | ICD-10-CM

## 2019-11-21 DIAGNOSIS — D72829 Elevated white blood cell count, unspecified: Secondary | ICD-10-CM

## 2019-11-21 LAB — CBC WITH DIFFERENTIAL/PLATELET
Basophils Absolute: 0.1 10*3/uL (ref 0.0–0.1)
Basophils Relative: 0.9 % (ref 0.0–3.0)
Eosinophils Absolute: 0.2 10*3/uL (ref 0.0–0.7)
Eosinophils Relative: 2.1 % (ref 0.0–5.0)
HCT: 42.6 % (ref 36.0–46.0)
Hemoglobin: 14.6 g/dL (ref 12.0–15.0)
Lymphocytes Relative: 16 % (ref 12.0–46.0)
Lymphs Abs: 1.4 10*3/uL (ref 0.7–4.0)
MCHC: 34.2 g/dL (ref 30.0–36.0)
MCV: 82.1 fl (ref 78.0–100.0)
Monocytes Absolute: 0.6 10*3/uL (ref 0.1–1.0)
Monocytes Relative: 7.3 % (ref 3.0–12.0)
Neutro Abs: 6.3 10*3/uL (ref 1.4–7.7)
Neutrophils Relative %: 73.7 % (ref 43.0–77.0)
Platelets: 307 10*3/uL (ref 150.0–400.0)
RBC: 5.19 Mil/uL — ABNORMAL HIGH (ref 3.87–5.11)
RDW: 14.9 % (ref 11.5–15.5)
WBC: 8.6 10*3/uL (ref 4.0–10.5)

## 2019-11-21 MED ORDER — BUDESONIDE 0.25 MG/2ML IN SUSP: 0.2500 mg | Freq: Two times a day (BID) | RESPIRATORY_TRACT | 1 refills | Status: DC

## 2019-11-21 NOTE — Telephone Encounter (Signed)
Called pt and advised message from the provider. Pt understood and verbalized understanding. Nothing further is needed.    

## 2019-11-21 NOTE — Telephone Encounter (Signed)
Sure. Can you take those off her medication list. Will send in steroid nebulizer called budesonide. She should not have any side effect similar to the others with this one. Rinse mouth after use.   Sent to [place] [facility] but  May need to send to [facility]

## 2019-11-21 NOTE — Telephone Encounter (Signed)
Spoke with pt, she states the levalbuterol and ipratropium and she is having headaches and gets very jittery. She cannot afford any inhalers at this time and needs another nebulizer medication to use. She tried to do them at 5 mins each and she still cannot tolerate it. She feels like she needs something. Is there any other nebulizer medication she can try.   [employer]

## 2019-11-22 ENCOUNTER — Other Ambulatory Visit: Payer: Self-pay

## 2019-11-22 ENCOUNTER — Other Ambulatory Visit: Payer: Self-pay | Admitting: Pharmacist

## 2019-11-22 ENCOUNTER — Telehealth: Payer: Self-pay | Admitting: Primary Care

## 2019-11-22 MED ORDER — BUDESONIDE 0.25 MG/2ML IN SUSP: 0.2500 mg | Freq: Two times a day (BID) | RESPIRATORY_TRACT | 1 refills | Status: DC

## 2019-11-22 NOTE — Telephone Encounter (Signed)
I had spoken to pt this morning, who advised that dx code was not on pt's rx.  This had been refaxed to be covered under Medicare part B (see other phone note dated for 11/22/2019).  I called pharmacy to see if this PA was needed for med to be covered under part B or part D.  Pharmacist verified that PA was for med being ran under part D, and that it is covered under part B.  Called pt to make aware of this.  Pt expressed understanding.  Nothing further needed at this time- will close encounter.

## 2019-11-22 NOTE — Patient Outreach (Signed)
[facility]) Care Management  11/22/2019  Kim Chavez 05/20/1946 [id number]   Patient was called to follow up on medication assistance. HIPAA identifiers were obtained.  Patient is a 72 year old female with multiple medical conditions including but not limited to:  Chron's Disease, GERD, Fibromyalgia, Hypothyroidism, Hypertension, Hyperlipidemia, and OSA.   Patient was sent applications for Pent[employer] Kim Chavez) but she did not return the application. She expressed some interest in completing the application for the 6712 benefit year.  Reviewed the application process and the necessary financial documents needed to submit her application.  Reviewed medications: Medications Reviewed Today    Reviewed by Martyn Ehrich, NP (Nurse Practitioner) on 11/13/19 at [place] List Status: <None>  Medication Order Taking? Sig Documenting Provider Last Dose Status Informant  Cholecalciferol (VITAMIN D3) 50 MCG (2000 UT) capsule [id number] Yes Take 2,000 Units by mouth daily.  [provider] Taking Active Self  Coenzyme Q10 (CO Q-10) 100 MG CAPS [id number] Yes Take 200 mg by mouth every morning.  [provider] Taking Active Self  cyclobenzaprine (FLEXERIL) 10 MG tablet [id number] Yes TAKE 1 2 TO 1 (ONE HALF TO ONE) TABLET BY MOUTH EVERY 8 HOURS AS NEEDED FOR MUSCLE SPASM [provider] Taking Active   famotidine (PEPCID) 20 MG tablet [id number] Yes Take 20 mg by mouth 2 (two) times daily. [provider] Taking Active   fexofenadine (ALLEGRA) 180 MG tablet [id number] Yes Take 180 mg by mouth daily. [provider] Taking Active   fluticasone (FLONASE) 50 MCG/ACT nasal spray [id number] Yes Place 2 sprays into both nostrils daily as needed for allergies.  [provider] Taking Active Self  guaiFENesin (MUCINEX) 600 MG 12 hr tablet [id number]  Take 2 tablets (1,200 mg total) by mouth 2 (two) times daily. Martyn Ehrich, NP   Active   HYDROcodone-acetaminophen (NORCO/VICODIN) 5-325 MG tablet [id number] Yes Take 1 tablet by mouth every 6 (six) hours as needed for moderate pain or severe pain.  [provider] Taking Active Self  ipratropium (ATROVENT) 0.02 % nebulizer solution [id number] Yes Take 2.5 mLs (0.5 mg total) by nebulization 2 (two) times daily. DX; O37 Kim Spatz, NP Taking Active   levalbuterol Penne Lash) 0.63 MG/3ML nebulizer solution [id number]  Take 3 mLs (0.63 mg total) by nebulization every 8 (eight) hours as needed for wheezing or shortness of breath. Martyn Ehrich, NP  Active   levothyroxine (SYNTHROID, LEVOTHROID) 50 MCG tablet [id number] Yes Take 50 mcg by mouth daily before breakfast. [provider] Taking Active Self  montelukast (SINGULAIR) 10 MG tablet [id number] Yes Take 1 tablet (10 mg total) by mouth at bedtime. Kim Spatz, NP Taking Active   Baruch Gouty [id number] Yes CPAP: At bedtime (nightly) [provider] Taking Active Self  omeprazole (PRILOSEC) 20 MG capsule [id number] Yes Take 40 mg by mouth at bedtime.  [provider] Taking Active Self  Probiotic Product (PROBIOTIC DAILY PO) [id number] Yes Take 4 tablets by mouth every morning. gummie [provider] Taking Active Self  simvastatin (ZOCOR) 40 MG tablet [id number] Yes Take 1 tablet by mouth at bedtime. [provider] Taking Active   zolpidem (AMBIEN) 10 MG tablet [id number] Yes Take 10 mg by mouth at bedtime.  [provider] Taking Active Self         Medication findings:  Increased risk of CNS depression and falls with Zolpidem.  Plan: Call patient back in 6-8 weeks.   Elayne Guerin,  PharmD, [place] Clinical Pharmacist [phone]

## 2019-11-22 NOTE — Telephone Encounter (Signed)
Medication name and strength: budesonide 0.67m/2ml suspension [place] insurance ID: [id number]: [phone]: [phone] Was the PA started on CMM?  yes If yes, please enter the Key: [id number] for approval/denial: [employer] has not yet replied to your PA request. You may close this dialog, return to your dashboard, and perform other tasks.  To check for an update later, open this request again from your dashboard.  If [employer] has not replied to your request within 24 hours please contact [employer] at [phone] For [facility]S Hospitalmembers, please call [phone]

## 2019-11-22 NOTE — Progress Notes (Signed)
CXR is normal. Ok to have endo

## 2019-11-22 NOTE — Telephone Encounter (Signed)
Pt requesting an alterative to budesonide.  States would be too expensive if covered.  [phone].

## 2019-11-27 ENCOUNTER — Telehealth: Payer: Self-pay | Admitting: Pulmonary Disease

## 2019-11-27 DIAGNOSIS — R1010 Upper abdominal pain, unspecified: Secondary | ICD-10-CM | POA: Diagnosis not present

## 2019-11-27 DIAGNOSIS — R634 Abnormal weight loss: Secondary | ICD-10-CM | POA: Diagnosis not present

## 2019-11-27 NOTE — Telephone Encounter (Signed)
Spoke with pt, she has some questions about the pulmicort. She wanted to know if this effects her osteopenia? She also wants to know does the medication suppress the immune system? Should she be concerned that the pulmicort is a steroid? Will it keep her up at night? RA please advise.

## 2019-11-28 NOTE — Telephone Encounter (Signed)
Spoke with the pt and notified of recs per Dr Elsworth Soho  She verbalized understanding and nothing further needed

## 2019-11-28 NOTE — Telephone Encounter (Signed)
Pulmicort is a steroid but stays within the lungs and does not get systemically absorbed. Hence reassured her that it does not have side effects on bones or immune system

## 2019-12-04 DIAGNOSIS — E039 Hypothyroidism, unspecified: Secondary | ICD-10-CM | POA: Diagnosis not present

## 2019-12-06 DIAGNOSIS — I10 Essential (primary) hypertension: Secondary | ICD-10-CM | POA: Diagnosis not present

## 2019-12-06 DIAGNOSIS — K760 Fatty (change of) liver, not elsewhere classified: Secondary | ICD-10-CM | POA: Diagnosis not present

## 2019-12-06 DIAGNOSIS — D0502 Lobular carcinoma in situ of left breast: Secondary | ICD-10-CM | POA: Diagnosis not present

## 2019-12-06 DIAGNOSIS — R918 Other nonspecific abnormal finding of lung field: Secondary | ICD-10-CM | POA: Diagnosis not present

## 2019-12-06 DIAGNOSIS — E039 Hypothyroidism, unspecified: Secondary | ICD-10-CM | POA: Diagnosis not present

## 2019-12-06 DIAGNOSIS — T85848S Pain due to other internal prosthetic devices, implants and grafts, sequela: Secondary | ICD-10-CM | POA: Diagnosis not present

## 2019-12-06 DIAGNOSIS — K769 Liver disease, unspecified: Secondary | ICD-10-CM | POA: Diagnosis not present

## 2019-12-06 DIAGNOSIS — Z9013 Acquired absence of bilateral breasts and nipples: Secondary | ICD-10-CM | POA: Diagnosis not present

## 2019-12-11 ENCOUNTER — Telehealth: Payer: Self-pay | Admitting: Pulmonary Disease

## 2019-12-11 NOTE — Telephone Encounter (Signed)
Called pt and advised message from the provider. Pt understood and verbalized understanding. Nothing further is needed.   Appt made with Kim Chavez on 12/19/2018 at 3:30pm

## 2019-12-11 NOTE — Telephone Encounter (Signed)
Spoke with pt, she is very weak, ear pain, and her skin is peeling. The ear pain comes and go but notices that the pain lasts longer after each time.She doesn't think she can take the singular either because she thinks it is making her very depressed and irritable. She is very shaky and noticed these symptoms since she started taking the budesonide. She hasn't tried taking anything for the pain. I advised pt to stop taking the medication until she hears back from [place]. Beth please advise since RA out of the office.

## 2019-12-11 NOTE — Telephone Encounter (Signed)
Please discontinue Singulair and budesonide. Take tylenol as needed for ear pain, if symptoms do not resolve with medication discontinuation recommend following up with PCP. Needs follow-up with Dr. Elsworth Soho next available

## 2019-12-13 DIAGNOSIS — T85848S Pain due to other internal prosthetic devices, implants and grafts, sequela: Secondary | ICD-10-CM | POA: Diagnosis not present

## 2019-12-20 ENCOUNTER — Ambulatory Visit (INDEPENDENT_AMBULATORY_CARE_PROVIDER_SITE_OTHER): Payer: Medicare Other | Admitting: Pulmonary Disease

## 2019-12-20 ENCOUNTER — Encounter: Payer: Self-pay | Admitting: Pulmonary Disease

## 2019-12-20 ENCOUNTER — Other Ambulatory Visit: Payer: Self-pay

## 2019-12-20 DIAGNOSIS — J410 Simple chronic bronchitis: Secondary | ICD-10-CM

## 2019-12-20 DIAGNOSIS — G4733 Obstructive sleep apnea (adult) (pediatric): Secondary | ICD-10-CM

## 2019-12-20 NOTE — Patient Instructions (Signed)
Okay to stop taking budesonide and Singulair. Take Lortabs only as needed when you develop a cough. Stay on Zyrtec and Flonase

## 2019-12-20 NOTE — Assessment & Plan Note (Signed)
CPAP is working well, she is compliant by report and this is certainly helped her daytime somnolence and fatigue  Weight loss encouraged, compliance with goal of at least 4-6 hrs every night is the expectation. Advised against medications with sedative side effects Cautioned against driving when sleepy - understanding that sleepiness will vary on a day to day basis

## 2019-12-20 NOTE — Assessment & Plan Note (Signed)
Okay to stop taking budesonide and Singulair. Take Lortabs only as needed when you develop a cough. Stay on Zyrtec and Flonase for perennial allergies

## 2019-12-20 NOTE — Progress Notes (Signed)
   Subjective:    Patient ID: Kim Chavez, female    DOB: 1946/12/07, 74 y.o.   MRN: [id number]  HPI  74 yo remote smoker for FU of mild OSA She sees neurology for chronic migraine headaches and is maintained on propranolol. Of note, she underwent surgery in 2016 for large liver cysts.  Chief Complaint  Patient presents with  . Follow-up    Patient is here for medication reactions. Patient states that she was using Budesonide and it was giving her an ear ache so she stopped it.She was also taking Singulair and it was making her depressed and a few other things so she stopped that as well.     After her last visit she was given budesonide-this gave her a uric so she stopped. Albuterol and Atrovent nebs made her have palpitations.  Singulair caused her to have depressed mood and she has stopped taking this too. Breathing is stable, she denies wheezing or cough.  She is compliant with her CPAP machine and has settled on with this she feels this is certainly helped improve her daytime somnolence and fatigue no problems with mask or pressure  Significant tests/ events reviewed  PSG 02/2017 showed AHI of 5/hour with total sleep time of 5 hours, she slept supine for 1 hour with AHI of 25/hour. Lowest desaturation was 76%, she spent 195 minutes with saturation less than 88%.   PSG in 08/2011 >> AHI of 22/hour, supine AHI was 61/hour. CPAP was titrated to 7 cm  HST 11/2017 >>moderate OSA with 21 events per hour.   CT scan from 04/2015 and chest x-ray from 07/2015  showed elevated right hemidiaphragm. Sniff test 10/2017 normal movement of BL diaphragms  10/2017 esophagram - normal  Review of Systems neg for any significant sore throat, dysphagia, itching, sneezing, nasal congestion or excess/ purulent secretions, fever, chills, sweats, unintended wt loss, pleuritic or exertional cp, hempoptysis, orthopnea pnd or change in chronic leg swelling. Also denies presyncope, palpitations,  heartburn, abdominal pain, nausea, vomiting, diarrhea or change in bowel or urinary habits, dysuria,hematuria, rash, arthralgias, visual complaints, headache, numbness weakness or ataxia.     Objective:   Physical Exam  Gen. Pleasant, well-nourished, in no distress ENT - no thrush, no pallor/icterus,no post nasal drip Neck: No JVD, no thyromegaly, no carotid bruits Lungs: no use of accessory muscles, no dullness to percussion, clear without rales or rhonchi  Cardiovascular: Rhythm regular, heart sounds  normal, no murmurs or gallops, no peripheral edema Musculoskeletal: No deformities, no cyanosis or clubbing        Assessment & Plan:

## 2019-12-25 DIAGNOSIS — R300 Dysuria: Secondary | ICD-10-CM | POA: Diagnosis not present

## 2019-12-27 DIAGNOSIS — R002 Palpitations: Secondary | ICD-10-CM | POA: Diagnosis not present

## 2019-12-27 DIAGNOSIS — K7689 Other specified diseases of liver: Secondary | ICD-10-CM | POA: Diagnosis not present

## 2019-12-27 DIAGNOSIS — J984 Other disorders of lung: Secondary | ICD-10-CM | POA: Diagnosis not present

## 2019-12-27 DIAGNOSIS — N390 Urinary tract infection, site not specified: Secondary | ICD-10-CM | POA: Diagnosis not present

## 2019-12-27 DIAGNOSIS — E041 Nontoxic single thyroid nodule: Secondary | ICD-10-CM | POA: Diagnosis not present

## 2019-12-27 DIAGNOSIS — K5090 Crohn's disease, unspecified, without complications: Secondary | ICD-10-CM | POA: Diagnosis not present

## 2019-12-27 DIAGNOSIS — I10 Essential (primary) hypertension: Secondary | ICD-10-CM | POA: Diagnosis not present

## 2019-12-27 DIAGNOSIS — J42 Unspecified chronic bronchitis: Secondary | ICD-10-CM | POA: Diagnosis not present

## 2019-12-28 DIAGNOSIS — R1010 Upper abdominal pain, unspecified: Secondary | ICD-10-CM | POA: Diagnosis not present

## 2019-12-28 DIAGNOSIS — K7689 Other specified diseases of liver: Secondary | ICD-10-CM | POA: Diagnosis not present

## 2019-12-28 DIAGNOSIS — R159 Full incontinence of feces: Secondary | ICD-10-CM | POA: Diagnosis not present

## 2019-12-28 DIAGNOSIS — R198 Other specified symptoms and signs involving the digestive system and abdomen: Secondary | ICD-10-CM | POA: Diagnosis not present

## 2019-12-28 DIAGNOSIS — K5000 Crohn's disease of small intestine without complications: Secondary | ICD-10-CM | POA: Diagnosis not present

## 2020-01-03 DIAGNOSIS — L821 Other seborrheic keratosis: Secondary | ICD-10-CM | POA: Diagnosis not present

## 2020-01-03 DIAGNOSIS — L814 Other melanin hyperpigmentation: Secondary | ICD-10-CM | POA: Diagnosis not present

## 2020-01-03 DIAGNOSIS — L308 Other specified dermatitis: Secondary | ICD-10-CM | POA: Diagnosis not present

## 2020-01-03 DIAGNOSIS — D2221 Melanocytic nevi of right ear and external auricular canal: Secondary | ICD-10-CM | POA: Diagnosis not present

## 2020-01-03 DIAGNOSIS — D225 Melanocytic nevi of trunk: Secondary | ICD-10-CM | POA: Diagnosis not present

## 2020-01-03 DIAGNOSIS — Z85828 Personal history of other malignant neoplasm of skin: Secondary | ICD-10-CM | POA: Diagnosis not present

## 2020-01-03 DIAGNOSIS — B351 Tinea unguium: Secondary | ICD-10-CM | POA: Diagnosis not present

## 2020-01-03 DIAGNOSIS — D2262 Melanocytic nevi of left upper limb, including shoulder: Secondary | ICD-10-CM | POA: Diagnosis not present

## 2020-01-03 DIAGNOSIS — L720 Epidermal cyst: Secondary | ICD-10-CM | POA: Diagnosis not present

## 2020-01-28 ENCOUNTER — Other Ambulatory Visit: Payer: Self-pay | Admitting: Pharmacist

## 2020-01-28 NOTE — Patient Outreach (Signed)
[facility] Care Management  01/28/2020  Kim Chavez [birth year] [id number]  Patient was called to follow up on medication assistance with [employer]. She was sent applications in December but had not returned them.  HIPAA identifiers were obtained.   Patient is a 74 year old female with multiple medical conditions including but not limited to: Constipation, suspected Chron's disease, fibromyalgia, hyperlipidemia, hypertension, insomnia, and OSA.  Patient's medications were reviewed via telephone:  Medications Reviewed Today    Reviewed by Elayne Guerin, [facility] (Pharmacist) on 01/28/20 at [place] List Status: <None>  Medication Order Taking? Sig Documenting Provider Last Dose Status Informant  albuterol (PROVENTIL) (2.5 MG/3ML) 0.083% nebulizer solution [id number] Yes Use 1 via nebulizer every 6 hours as needed [provider] Taking Active   budesonide (PULMICORT) 0.25 MG/2ML nebulizer solution [id number] No Take 2 mLs (0.25 mg total) by nebulization 2 (two) times daily. DX: J44.9  Patient not taking: Reported on 12/20/2019   Martyn Ehrich, NP Not Taking Active   calcium carbonate (TUMS - DOSED IN MG ELEMENTAL CALCIUM) 500 MG chewable tablet [id number] Yes Chew 1 tablet daily as needed [provider] Taking Active   cetirizine (ZYRTEC) 10 MG tablet [id number] Yes Take 1 tablet by mouth daily as needed. [provider] Taking Active   chlorpheniramine (CHLOR-TRIMETON) 4 MG tablet [id number] Yes Take 4 mg by mouth at bedtime. [provider] Taking Active   Cholecalciferol (VITAMIN D3) 50 MCG (2000 UT) capsule [id number] Yes Take 2,000 Units by mouth daily.  [provider] Taking Active Self  Coenzyme Q10 (CO Q-10) 100 MG CAPS [id number] Yes Take 200 mg by mouth every morning.  [provider] Taking Active Self  cyclobenzaprine (FLEXERIL) 10 MG tablet [id number] Yes TAKE 1 2 TO 1 (ONE HALF TO ONE) TABLET BY MOUTH EVERY  8 HOURS AS NEEDED FOR MUSCLE SPASM [provider] Taking Active   fluticasone (FLONASE) 50 MCG/ACT nasal spray [id number] Yes Place 2 sprays into both nostrils daily as needed for allergies.  [provider] Taking Active Self  hydrochlorothiazide (HYDRODIURIL) 12.5 MG tablet [id number] Yes Take 1 tablet by mouth daily. [provider] Taking Active   HYDROcodone-acetaminophen (NORCO/VICODIN) 5-325 MG tablet [id number] Yes Take 1 tablet by mouth every 6 (six) hours as needed for moderate pain or severe pain.  [provider] Taking Active Self  levothyroxine (SYNTHROID, LEVOTHROID) 50 MCG tablet [id number] Yes Take 50 mcg by mouth daily before breakfast. [provider] Taking Active Self  NON FORMULARY [id number] Yes CPAP: At bedtime (nightly) [provider] Taking Active Self  omeprazole (PRILOSEC) 20 MG capsule [id number] Yes Take 40 mg by mouth at bedtime.  [provider] Taking Active Self  Pediatric Multivit-Minerals-C (EQ MULTIVITAMIN GUMMIES PO) [id number] Yes Take by mouth. [provider] Taking Active   Polyethyl Glycol-Propyl Glycol (SYSTANE OP) [id number] Yes Use for dry eyes as needed [provider] Taking Active   simvastatin (ZOCOR) 40 MG tablet [id number] Yes Take 1 tablet by mouth at bedtime. [provider] Taking Active   zolpidem (AMBIEN) 10 MG tablet [id number] Yes Take 10 mg by mouth at bedtime.  [provider] Taking Active Self         Patient confirmed Pentasa was discontinued and that her new provider is questioning if she really has Chron's. She said she does not have an issue affording her other medications.  Patient said she had the application we sent her and if she  were to be restarted on Pentasa, she would reach out.  Plan: Patient was provided my contact information to reach out to me in the future with any medication related questions or concerns.  Pharmacy case will  be closed.  Elayne Guerin, PharmD, [place] Clinical Pharmacist [phone]

## 2020-01-29 DIAGNOSIS — R590 Localized enlarged lymph nodes: Secondary | ICD-10-CM | POA: Diagnosis not present

## 2020-01-29 DIAGNOSIS — I10 Essential (primary) hypertension: Secondary | ICD-10-CM | POA: Diagnosis not present

## 2020-02-08 DIAGNOSIS — K7689 Other specified diseases of liver: Secondary | ICD-10-CM | POA: Diagnosis not present

## 2020-02-08 DIAGNOSIS — I10 Essential (primary) hypertension: Secondary | ICD-10-CM | POA: Diagnosis not present

## 2020-02-08 DIAGNOSIS — R140 Abdominal distension (gaseous): Secondary | ICD-10-CM | POA: Diagnosis not present

## 2020-02-08 DIAGNOSIS — R590 Localized enlarged lymph nodes: Secondary | ICD-10-CM | POA: Diagnosis not present

## 2020-03-12 DIAGNOSIS — R300 Dysuria: Secondary | ICD-10-CM | POA: Diagnosis not present

## 2020-03-28 DIAGNOSIS — E739 Lactose intolerance, unspecified: Secondary | ICD-10-CM | POA: Diagnosis not present

## 2020-03-28 DIAGNOSIS — K219 Gastro-esophageal reflux disease without esophagitis: Secondary | ICD-10-CM | POA: Diagnosis not present

## 2020-03-28 DIAGNOSIS — R1010 Upper abdominal pain, unspecified: Secondary | ICD-10-CM | POA: Diagnosis not present

## 2020-03-28 DIAGNOSIS — K7689 Other specified diseases of liver: Secondary | ICD-10-CM | POA: Diagnosis not present

## 2020-04-01 DIAGNOSIS — J392 Other diseases of pharynx: Secondary | ICD-10-CM | POA: Diagnosis not present

## 2020-04-01 DIAGNOSIS — E039 Hypothyroidism, unspecified: Secondary | ICD-10-CM | POA: Diagnosis not present

## 2020-04-01 DIAGNOSIS — E871 Hypo-osmolality and hyponatremia: Secondary | ICD-10-CM | POA: Diagnosis not present

## 2020-04-01 DIAGNOSIS — J309 Allergic rhinitis, unspecified: Secondary | ICD-10-CM | POA: Diagnosis not present

## 2020-04-01 DIAGNOSIS — R590 Localized enlarged lymph nodes: Secondary | ICD-10-CM | POA: Diagnosis not present

## 2020-04-16 DIAGNOSIS — R300 Dysuria: Secondary | ICD-10-CM | POA: Diagnosis not present

## 2020-04-16 DIAGNOSIS — R3915 Urgency of urination: Secondary | ICD-10-CM | POA: Diagnosis not present

## 2020-04-16 DIAGNOSIS — N3020 Other chronic cystitis without hematuria: Secondary | ICD-10-CM | POA: Diagnosis not present

## 2020-04-21 DIAGNOSIS — R070 Pain in throat: Secondary | ICD-10-CM | POA: Diagnosis not present

## 2020-04-21 DIAGNOSIS — H608X3 Other otitis externa, bilateral: Secondary | ICD-10-CM | POA: Diagnosis not present

## 2020-04-23 DIAGNOSIS — Z961 Presence of intraocular lens: Secondary | ICD-10-CM | POA: Diagnosis not present

## 2020-04-23 DIAGNOSIS — H04123 Dry eye syndrome of bilateral lacrimal glands: Secondary | ICD-10-CM | POA: Diagnosis not present

## 2020-04-23 DIAGNOSIS — H10413 Chronic giant papillary conjunctivitis, bilateral: Secondary | ICD-10-CM | POA: Diagnosis not present

## 2020-05-05 DIAGNOSIS — E041 Nontoxic single thyroid nodule: Secondary | ICD-10-CM | POA: Diagnosis not present

## 2020-05-05 DIAGNOSIS — E039 Hypothyroidism, unspecified: Secondary | ICD-10-CM | POA: Diagnosis not present

## 2020-05-27 DIAGNOSIS — R350 Frequency of micturition: Secondary | ICD-10-CM | POA: Diagnosis not present

## 2020-05-27 DIAGNOSIS — R3915 Urgency of urination: Secondary | ICD-10-CM | POA: Diagnosis not present

## 2020-06-10 DIAGNOSIS — R300 Dysuria: Secondary | ICD-10-CM | POA: Diagnosis not present

## 2020-07-08 DIAGNOSIS — R3915 Urgency of urination: Secondary | ICD-10-CM | POA: Diagnosis not present

## 2020-07-08 DIAGNOSIS — R350 Frequency of micturition: Secondary | ICD-10-CM | POA: Diagnosis not present

## 2020-07-16 DIAGNOSIS — R350 Frequency of micturition: Secondary | ICD-10-CM | POA: Diagnosis not present

## 2020-07-22 DIAGNOSIS — R3915 Urgency of urination: Secondary | ICD-10-CM | POA: Diagnosis not present

## 2020-07-29 DIAGNOSIS — R350 Frequency of micturition: Secondary | ICD-10-CM | POA: Diagnosis not present

## 2020-07-29 DIAGNOSIS — R3915 Urgency of urination: Secondary | ICD-10-CM | POA: Diagnosis not present

## 2020-08-05 DIAGNOSIS — R3915 Urgency of urination: Secondary | ICD-10-CM | POA: Diagnosis not present

## 2020-08-12 DIAGNOSIS — R350 Frequency of micturition: Secondary | ICD-10-CM | POA: Diagnosis not present

## 2020-08-12 DIAGNOSIS — R3915 Urgency of urination: Secondary | ICD-10-CM | POA: Diagnosis not present

## 2020-08-19 DIAGNOSIS — R350 Frequency of micturition: Secondary | ICD-10-CM | POA: Diagnosis not present

## 2020-09-02 DIAGNOSIS — N281 Cyst of kidney, acquired: Secondary | ICD-10-CM | POA: Diagnosis not present

## 2020-09-02 DIAGNOSIS — N3020 Other chronic cystitis without hematuria: Secondary | ICD-10-CM | POA: Diagnosis not present

## 2020-09-02 DIAGNOSIS — N200 Calculus of kidney: Secondary | ICD-10-CM | POA: Diagnosis not present

## 2020-09-02 DIAGNOSIS — R3915 Urgency of urination: Secondary | ICD-10-CM | POA: Diagnosis not present

## 2020-09-04 DIAGNOSIS — R7301 Impaired fasting glucose: Secondary | ICD-10-CM | POA: Diagnosis not present

## 2020-09-04 DIAGNOSIS — E785 Hyperlipidemia, unspecified: Secondary | ICD-10-CM | POA: Diagnosis not present

## 2020-09-04 DIAGNOSIS — E039 Hypothyroidism, unspecified: Secondary | ICD-10-CM | POA: Diagnosis not present

## 2020-09-09 DIAGNOSIS — R350 Frequency of micturition: Secondary | ICD-10-CM | POA: Diagnosis not present

## 2020-09-11 DIAGNOSIS — Z1331 Encounter for screening for depression: Secondary | ICD-10-CM | POA: Diagnosis not present

## 2020-09-11 DIAGNOSIS — M79672 Pain in left foot: Secondary | ICD-10-CM | POA: Diagnosis not present

## 2020-09-11 DIAGNOSIS — Z853 Personal history of malignant neoplasm of breast: Secondary | ICD-10-CM | POA: Diagnosis not present

## 2020-09-11 DIAGNOSIS — Z0000 Encounter for general adult medical examination without abnormal findings: Secondary | ICD-10-CM | POA: Diagnosis not present

## 2020-09-11 DIAGNOSIS — G4733 Obstructive sleep apnea (adult) (pediatric): Secondary | ICD-10-CM | POA: Diagnosis not present

## 2020-09-11 DIAGNOSIS — R82998 Other abnormal findings in urine: Secondary | ICD-10-CM | POA: Diagnosis not present

## 2020-09-11 DIAGNOSIS — F331 Major depressive disorder, recurrent, moderate: Secondary | ICD-10-CM | POA: Diagnosis not present

## 2020-09-11 DIAGNOSIS — E785 Hyperlipidemia, unspecified: Secondary | ICD-10-CM | POA: Diagnosis not present

## 2020-09-11 DIAGNOSIS — E039 Hypothyroidism, unspecified: Secondary | ICD-10-CM | POA: Diagnosis not present

## 2020-09-11 DIAGNOSIS — R590 Localized enlarged lymph nodes: Secondary | ICD-10-CM | POA: Diagnosis not present

## 2020-09-11 DIAGNOSIS — R911 Solitary pulmonary nodule: Secondary | ICD-10-CM | POA: Diagnosis not present

## 2020-09-11 DIAGNOSIS — I10 Essential (primary) hypertension: Secondary | ICD-10-CM | POA: Diagnosis not present

## 2020-09-11 DIAGNOSIS — Z23 Encounter for immunization: Secondary | ICD-10-CM | POA: Diagnosis not present

## 2020-09-16 DIAGNOSIS — Z01419 Encounter for gynecological examination (general) (routine) without abnormal findings: Secondary | ICD-10-CM | POA: Diagnosis not present

## 2020-09-16 DIAGNOSIS — N952 Postmenopausal atrophic vaginitis: Secondary | ICD-10-CM | POA: Diagnosis not present

## 2020-09-16 DIAGNOSIS — Z01411 Encounter for gynecological examination (general) (routine) with abnormal findings: Secondary | ICD-10-CM | POA: Diagnosis not present

## 2020-09-22 DIAGNOSIS — R918 Other nonspecific abnormal finding of lung field: Secondary | ICD-10-CM | POA: Diagnosis not present

## 2020-09-30 DIAGNOSIS — R350 Frequency of micturition: Secondary | ICD-10-CM | POA: Diagnosis not present

## 2020-09-30 DIAGNOSIS — R3915 Urgency of urination: Secondary | ICD-10-CM | POA: Diagnosis not present

## 2020-10-03 DIAGNOSIS — Z1212 Encounter for screening for malignant neoplasm of rectum: Secondary | ICD-10-CM | POA: Diagnosis not present

## 2020-10-08 DIAGNOSIS — Z85828 Personal history of other malignant neoplasm of skin: Secondary | ICD-10-CM | POA: Diagnosis not present

## 2020-10-08 DIAGNOSIS — L720 Epidermal cyst: Secondary | ICD-10-CM | POA: Diagnosis not present

## 2020-10-08 DIAGNOSIS — L308 Other specified dermatitis: Secondary | ICD-10-CM | POA: Diagnosis not present

## 2020-10-13 DIAGNOSIS — I10 Essential (primary) hypertension: Secondary | ICD-10-CM | POA: Diagnosis not present

## 2020-11-17 DIAGNOSIS — I10 Essential (primary) hypertension: Secondary | ICD-10-CM | POA: Diagnosis not present

## 2020-11-17 DIAGNOSIS — M797 Fibromyalgia: Secondary | ICD-10-CM | POA: Diagnosis not present

## 2020-11-18 DIAGNOSIS — R3915 Urgency of urination: Secondary | ICD-10-CM | POA: Diagnosis not present

## 2020-11-27 DIAGNOSIS — L720 Epidermal cyst: Secondary | ICD-10-CM | POA: Diagnosis not present

## 2020-11-27 DIAGNOSIS — Z85828 Personal history of other malignant neoplasm of skin: Secondary | ICD-10-CM | POA: Diagnosis not present

## 2020-12-16 DIAGNOSIS — R3915 Urgency of urination: Secondary | ICD-10-CM | POA: Diagnosis not present

## 2020-12-16 DIAGNOSIS — R350 Frequency of micturition: Secondary | ICD-10-CM | POA: Diagnosis not present

## 2021-01-07 DIAGNOSIS — L570 Actinic keratosis: Secondary | ICD-10-CM | POA: Diagnosis not present

## 2021-01-07 DIAGNOSIS — L720 Epidermal cyst: Secondary | ICD-10-CM | POA: Diagnosis not present

## 2021-01-07 DIAGNOSIS — L821 Other seborrheic keratosis: Secondary | ICD-10-CM | POA: Diagnosis not present

## 2021-01-07 DIAGNOSIS — D2271 Melanocytic nevi of right lower limb, including hip: Secondary | ICD-10-CM | POA: Diagnosis not present

## 2021-01-07 DIAGNOSIS — Z85828 Personal history of other malignant neoplasm of skin: Secondary | ICD-10-CM | POA: Diagnosis not present

## 2021-01-07 DIAGNOSIS — D2272 Melanocytic nevi of left lower limb, including hip: Secondary | ICD-10-CM | POA: Diagnosis not present

## 2021-01-07 DIAGNOSIS — D1801 Hemangioma of skin and subcutaneous tissue: Secondary | ICD-10-CM | POA: Diagnosis not present

## 2021-01-07 DIAGNOSIS — D224 Melanocytic nevi of scalp and neck: Secondary | ICD-10-CM | POA: Diagnosis not present

## 2021-01-07 DIAGNOSIS — D225 Melanocytic nevi of trunk: Secondary | ICD-10-CM | POA: Diagnosis not present

## 2021-01-07 DIAGNOSIS — L853 Xerosis cutis: Secondary | ICD-10-CM | POA: Diagnosis not present

## 2021-01-13 DIAGNOSIS — R3915 Urgency of urination: Secondary | ICD-10-CM | POA: Diagnosis not present

## 2021-01-13 DIAGNOSIS — R350 Frequency of micturition: Secondary | ICD-10-CM | POA: Diagnosis not present

## 2021-01-28 DIAGNOSIS — N644 Mastodynia: Secondary | ICD-10-CM | POA: Diagnosis not present

## 2021-01-28 DIAGNOSIS — R911 Solitary pulmonary nodule: Secondary | ICD-10-CM | POA: Diagnosis not present

## 2021-01-28 DIAGNOSIS — Z9882 Breast implant status: Secondary | ICD-10-CM | POA: Diagnosis not present

## 2021-01-28 DIAGNOSIS — E039 Hypothyroidism, unspecified: Secondary | ICD-10-CM | POA: Diagnosis not present

## 2021-01-28 DIAGNOSIS — K760 Fatty (change of) liver, not elsewhere classified: Secondary | ICD-10-CM | POA: Diagnosis not present

## 2021-01-28 DIAGNOSIS — T85848S Pain due to other internal prosthetic devices, implants and grafts, sequela: Secondary | ICD-10-CM | POA: Diagnosis not present

## 2021-01-28 DIAGNOSIS — D0502 Lobular carcinoma in situ of left breast: Secondary | ICD-10-CM | POA: Diagnosis not present

## 2021-01-28 DIAGNOSIS — R918 Other nonspecific abnormal finding of lung field: Secondary | ICD-10-CM | POA: Diagnosis not present

## 2021-01-28 DIAGNOSIS — I10 Essential (primary) hypertension: Secondary | ICD-10-CM | POA: Diagnosis not present

## 2021-02-10 DIAGNOSIS — R3915 Urgency of urination: Secondary | ICD-10-CM | POA: Diagnosis not present

## 2021-02-10 DIAGNOSIS — R350 Frequency of micturition: Secondary | ICD-10-CM | POA: Diagnosis not present

## 2021-03-10 DIAGNOSIS — R350 Frequency of micturition: Secondary | ICD-10-CM | POA: Diagnosis not present

## 2021-03-10 DIAGNOSIS — R3915 Urgency of urination: Secondary | ICD-10-CM | POA: Diagnosis not present

## 2021-04-21 DIAGNOSIS — R350 Frequency of micturition: Secondary | ICD-10-CM | POA: Diagnosis not present

## 2021-04-21 DIAGNOSIS — R3915 Urgency of urination: Secondary | ICD-10-CM | POA: Diagnosis not present

## 2021-04-28 ENCOUNTER — Ambulatory Visit: Payer: Medicare Other | Admitting: Pulmonary Disease

## 2021-04-28 DIAGNOSIS — H04123 Dry eye syndrome of bilateral lacrimal glands: Secondary | ICD-10-CM | POA: Diagnosis not present

## 2021-04-28 DIAGNOSIS — H524 Presbyopia: Secondary | ICD-10-CM | POA: Diagnosis not present

## 2021-04-28 DIAGNOSIS — Z961 Presence of intraocular lens: Secondary | ICD-10-CM | POA: Diagnosis not present

## 2021-05-26 DIAGNOSIS — R3915 Urgency of urination: Secondary | ICD-10-CM | POA: Diagnosis not present

## 2021-05-26 DIAGNOSIS — R350 Frequency of micturition: Secondary | ICD-10-CM | POA: Diagnosis not present

## 2021-06-11 DIAGNOSIS — I10 Essential (primary) hypertension: Secondary | ICD-10-CM | POA: Diagnosis not present

## 2021-06-11 DIAGNOSIS — E039 Hypothyroidism, unspecified: Secondary | ICD-10-CM | POA: Diagnosis not present

## 2021-06-11 DIAGNOSIS — E785 Hyperlipidemia, unspecified: Secondary | ICD-10-CM | POA: Diagnosis not present

## 2021-07-12 DIAGNOSIS — E785 Hyperlipidemia, unspecified: Secondary | ICD-10-CM | POA: Diagnosis not present

## 2021-07-12 DIAGNOSIS — G4733 Obstructive sleep apnea (adult) (pediatric): Secondary | ICD-10-CM | POA: Diagnosis not present

## 2021-07-12 DIAGNOSIS — E039 Hypothyroidism, unspecified: Secondary | ICD-10-CM | POA: Diagnosis not present

## 2021-07-12 DIAGNOSIS — I10 Essential (primary) hypertension: Secondary | ICD-10-CM | POA: Diagnosis not present

## 2021-07-13 DIAGNOSIS — E039 Hypothyroidism, unspecified: Secondary | ICD-10-CM | POA: Diagnosis not present

## 2021-07-13 DIAGNOSIS — E041 Nontoxic single thyroid nodule: Secondary | ICD-10-CM | POA: Diagnosis not present

## 2021-07-14 DIAGNOSIS — R350 Frequency of micturition: Secondary | ICD-10-CM | POA: Diagnosis not present

## 2021-08-25 DIAGNOSIS — R350 Frequency of micturition: Secondary | ICD-10-CM | POA: Diagnosis not present

## 2021-09-11 DIAGNOSIS — G4733 Obstructive sleep apnea (adult) (pediatric): Secondary | ICD-10-CM | POA: Diagnosis not present

## 2021-09-11 DIAGNOSIS — E785 Hyperlipidemia, unspecified: Secondary | ICD-10-CM | POA: Diagnosis not present

## 2021-09-11 DIAGNOSIS — I10 Essential (primary) hypertension: Secondary | ICD-10-CM | POA: Diagnosis not present

## 2021-09-11 DIAGNOSIS — E039 Hypothyroidism, unspecified: Secondary | ICD-10-CM | POA: Diagnosis not present

## 2021-09-18 DIAGNOSIS — E785 Hyperlipidemia, unspecified: Secondary | ICD-10-CM | POA: Diagnosis not present

## 2021-09-18 DIAGNOSIS — E039 Hypothyroidism, unspecified: Secondary | ICD-10-CM | POA: Diagnosis not present

## 2021-09-18 DIAGNOSIS — R7301 Impaired fasting glucose: Secondary | ICD-10-CM | POA: Diagnosis not present

## 2021-09-29 DIAGNOSIS — R3915 Urgency of urination: Secondary | ICD-10-CM | POA: Diagnosis not present

## 2021-09-29 DIAGNOSIS — R350 Frequency of micturition: Secondary | ICD-10-CM | POA: Diagnosis not present

## 2021-09-30 DIAGNOSIS — F331 Major depressive disorder, recurrent, moderate: Secondary | ICD-10-CM | POA: Diagnosis not present

## 2021-09-30 DIAGNOSIS — R7301 Impaired fasting glucose: Secondary | ICD-10-CM | POA: Diagnosis not present

## 2021-09-30 DIAGNOSIS — E039 Hypothyroidism, unspecified: Secondary | ICD-10-CM | POA: Diagnosis not present

## 2021-09-30 DIAGNOSIS — E041 Nontoxic single thyroid nodule: Secondary | ICD-10-CM | POA: Diagnosis not present

## 2021-09-30 DIAGNOSIS — M797 Fibromyalgia: Secondary | ICD-10-CM | POA: Diagnosis not present

## 2021-09-30 DIAGNOSIS — E785 Hyperlipidemia, unspecified: Secondary | ICD-10-CM | POA: Diagnosis not present

## 2021-09-30 DIAGNOSIS — N1831 Chronic kidney disease, stage 3a: Secondary | ICD-10-CM | POA: Diagnosis not present

## 2021-09-30 DIAGNOSIS — G44209 Tension-type headache, unspecified, not intractable: Secondary | ICD-10-CM | POA: Diagnosis not present

## 2021-09-30 DIAGNOSIS — G4700 Insomnia, unspecified: Secondary | ICD-10-CM | POA: Diagnosis not present

## 2021-09-30 DIAGNOSIS — Z853 Personal history of malignant neoplasm of breast: Secondary | ICD-10-CM | POA: Diagnosis not present

## 2021-09-30 DIAGNOSIS — Z0000 Encounter for general adult medical examination without abnormal findings: Secondary | ICD-10-CM | POA: Diagnosis not present

## 2021-09-30 DIAGNOSIS — I10 Essential (primary) hypertension: Secondary | ICD-10-CM | POA: Diagnosis not present

## 2021-10-06 DIAGNOSIS — N3020 Other chronic cystitis without hematuria: Secondary | ICD-10-CM | POA: Diagnosis not present

## 2021-10-06 DIAGNOSIS — R3915 Urgency of urination: Secondary | ICD-10-CM | POA: Diagnosis not present

## 2021-10-06 DIAGNOSIS — N200 Calculus of kidney: Secondary | ICD-10-CM | POA: Diagnosis not present

## 2021-10-06 DIAGNOSIS — N281 Cyst of kidney, acquired: Secondary | ICD-10-CM | POA: Diagnosis not present

## 2021-10-12 DIAGNOSIS — E785 Hyperlipidemia, unspecified: Secondary | ICD-10-CM | POA: Diagnosis not present

## 2021-10-12 DIAGNOSIS — G4733 Obstructive sleep apnea (adult) (pediatric): Secondary | ICD-10-CM | POA: Diagnosis not present

## 2021-10-12 DIAGNOSIS — I10 Essential (primary) hypertension: Secondary | ICD-10-CM | POA: Diagnosis not present

## 2021-10-12 DIAGNOSIS — E039 Hypothyroidism, unspecified: Secondary | ICD-10-CM | POA: Diagnosis not present

## 2021-10-19 DIAGNOSIS — Z780 Asymptomatic menopausal state: Secondary | ICD-10-CM | POA: Diagnosis not present

## 2021-11-02 ENCOUNTER — Encounter: Payer: Self-pay | Admitting: Pulmonary Disease

## 2021-11-02 ENCOUNTER — Other Ambulatory Visit: Payer: Self-pay

## 2021-11-02 ENCOUNTER — Ambulatory Visit (INDEPENDENT_AMBULATORY_CARE_PROVIDER_SITE_OTHER): Payer: Medicare Other | Admitting: Pulmonary Disease

## 2021-11-02 DIAGNOSIS — K7689 Other specified diseases of liver: Secondary | ICD-10-CM

## 2021-11-02 DIAGNOSIS — G4733 Obstructive sleep apnea (adult) (pediatric): Secondary | ICD-10-CM | POA: Diagnosis not present

## 2021-11-02 NOTE — Patient Instructions (Signed)
Lower the humidity setting on your CPAP

## 2021-11-02 NOTE — Assessment & Plan Note (Signed)
Appears benign on CT to my review, bibasal scarring reported on CT chest does not appear to be impressive

## 2021-11-02 NOTE — Assessment & Plan Note (Signed)
I have asked her to lower the humidity setting on her CPAP. CPAP download was reviewed which shows good control of events at 9.5 cm with usage more than 6 hours every night, few missed nights  Weight loss encouraged, compliance with goal of at least 4-6 hrs every night is the expectation. Advised against medications with sedative side effects Cautioned against driving when sleepy - understanding that sleepiness will vary on a day to day basis

## 2021-11-02 NOTE — Progress Notes (Signed)
   Subjective:    Patient ID: Kim Chavez, female    DOB: Dec 22, 1945, 75 y.o.   MRN: [id number]  HPI  75 yo remote smoker for FU of mild OSA PMH - chronic migraine headaches on propranolol.  2016  surgery for large liver cysts  Meds - Albuterol and Atrovent nebs caused palpitations.  Singulair caused depressed mood   On her last office visit 12/2019, we reviewed PFTs which were normal and discontinued Singulair and Pulmicort which caused her hoarseness.  She has done well with her breathing since then. She had recurrence of her chronic cough about a month ago and use Chlor-Trimeton tabs with significant relief.  She reports good usage of her CPAP and denies problems with mask or pressure.  She has water in the hose sometimes and wonders what she can do with the humidity setting.    Significant tests/ events reviewed  10/2019 PFTs >> normal PSG 02/2017 showed AHI of 5/hour with total sleep time of 5 hours, she slept supine for 1 hour with AHI of 25/hour.  Lowest desaturation was 76%, she spent 195 minutes with saturation less than 88%.     PSG in 08/2011 >> AHI of 22/hour, supine AHI was 61/hour.  CPAP was titrated to 7 cm   HST 11/2017 >>moderate OSA with 21 events per hour.    CT chest/abdomen/pelvis 05/2019 liver cysts, not impressed with bibasal scarring CT scan from 04/2015 and chest x-ray from 07/2015  showed elevated right hemidiaphragm. Sniff test 10/2017 normal movement of BL diaphragms   10/2017 esophagram - normal  Review of Systems neg for any significant sore throat, dysphagia, itching, sneezing, nasal congestion or excess/ purulent secretions, fever, chills, sweats, unintended wt loss, pleuritic or exertional cp, hempoptysis, orthopnea pnd or change in chronic leg swelling. Also denies presyncope, palpitations, heartburn, abdominal pain, nausea, vomiting, diarrhea or change in bowel or urinary habits, dysuria,hematuria, rash, arthralgias, visual complaints, headache,  numbness weakness or ataxia.     Objective:   Physical Exam  Gen. Pleasant, obese, in no distress ENT - no lesions, no post nasal drip Neck: No JVD, no thyromegaly, no carotid bruits Lungs: no use of accessory muscles, no dullness to percussion, decreased without rales or rhonchi  Cardiovascular: Rhythm regular, heart sounds  normal, no murmurs or gallops, no peripheral edema Musculoskeletal: No deformities, no cyanosis or clubbing , no tremors       Assessment & Plan:

## 2021-11-03 DIAGNOSIS — R350 Frequency of micturition: Secondary | ICD-10-CM | POA: Diagnosis not present

## 2021-12-21 ENCOUNTER — Encounter: Payer: Self-pay | Admitting: Physician Assistant

## 2021-12-22 DIAGNOSIS — R350 Frequency of micturition: Secondary | ICD-10-CM | POA: Diagnosis not present

## 2022-01-05 ENCOUNTER — Encounter: Payer: Self-pay | Admitting: Physician Assistant

## 2022-01-05 ENCOUNTER — Ambulatory Visit (INDEPENDENT_AMBULATORY_CARE_PROVIDER_SITE_OTHER): Payer: Medicare Other | Admitting: Physician Assistant

## 2022-01-05 VITALS — BP 152/78 | HR 72 | Ht 63.0 in | Wt 157.0 lb

## 2022-01-05 DIAGNOSIS — K219 Gastro-esophageal reflux disease without esophagitis: Secondary | ICD-10-CM | POA: Diagnosis not present

## 2022-01-05 DIAGNOSIS — R1013 Epigastric pain: Secondary | ICD-10-CM | POA: Diagnosis not present

## 2022-01-05 DIAGNOSIS — R1084 Generalized abdominal pain: Secondary | ICD-10-CM | POA: Diagnosis not present

## 2022-01-05 DIAGNOSIS — R110 Nausea: Secondary | ICD-10-CM | POA: Diagnosis not present

## 2022-01-05 DIAGNOSIS — R194 Change in bowel habit: Secondary | ICD-10-CM

## 2022-01-05 DIAGNOSIS — G8929 Other chronic pain: Secondary | ICD-10-CM | POA: Diagnosis not present

## 2022-01-05 NOTE — Patient Instructions (Signed)
You have been scheduled for an endoscopy and colonoscopy. Please follow the written instructions given to you at your visit today. Please pick up your prep supplies at the pharmacy within the next 1-3 days. If you use inhalers (even only as needed), please bring them with you on the day of your procedure.  If you are age 76 or older, your body mass index should be between 23-30. Your Body mass index is 27.81 kg/m. If this is out of the aforementioned range listed, please consider follow up with your Primary Care Provider.  If you are age 86 or younger, your body mass index should be between 19-25. Your Body mass index is 27.81 kg/m. If this is out of the aformentioned range listed, please consider follow up with your Primary Care Provider.   ________________________________________________________  The [facility] providers would like to encourage you to use [facility] to communicate with providers for non-urgent requests or questions.  Due to long hold times on the telephone, sending your provider a message by [employer] may be a faster and more efficient way to get a response.  Please allow 48 business hours for a response.  Please remember that this is for non-urgent requests.  _______________________________________________________

## 2022-01-05 NOTE — Progress Notes (Signed)
Reviewed and agree with documentation and assessment and plan. K. Veena Segundo Makela , MD   

## 2022-01-05 NOTE — Progress Notes (Addendum)
Chief Complaint: GERD and abdominal pain and history of adenomatous polyps  HPI:    Kim Chavez is a 76 year old female with a past medical history of breast cancer, fibromyalgia, GERD, OSA and others listed below, who was referred to me by Ginger Organ., MD for a complaint of GERD and abdominal pain as well as a history of adenomatous polyps.    07/05/2018 colonoscopy with Dr. Paulita Fujita with many sessile polyps in the descending, transverse colon and cecum.  The polyps were 4-8 mm in size.  A few small sigmoid colon diverticula and otherwise not seen.  The distal 5 cm of the terminal ileum appeared normal..  Biopsy showed multiple tubular adenomas.  Colon was supposed to be repeated in 3 years.    06/11/2019 CT entero of the abdomen and pelvis with contrast showed no current bowel wall thickening or abdominal accentuated mucosal enhancement to suggest active Crohn's disease, there was fat deposition in the wall of the terminal ileum which has a weak association with IBD and also some narrowing of the lumen of the terminal ileum which could be from chronic low-grade inflammation, interval resection of the largest cyst in the right hepatic lobe.  Mild bibasilar scarring and aortic atherosclerosis.    03/28/2020 patient saw Dr. Earlean Shawl at [facility] and at that time discussed ongoing reflux with upper abdominal pain.  Her abdominal pain is better since stopping Macrobid that she is taking for UTI.  She was following a lactose limited diet which helped her symptoms.  She continued on Omeprazole 20 twice daily and Famotidine nightly.  At that time she asked to discontinue the Omeprazole.  Liver cysts were discussed and it was recommended she continue to have these monitored every 1 to 2 years.  At that time she was up-to-date and her next imaging was suggested 10/2022 with a either MRCP or right upper quadrant ultrasound.    Today, the patient explains that the past 2 years have been a "roller coaster"  for her as far as her abdominal/GI symptoms and trying to figure things out.  She first went to see Dr. Paulita Fujita as above and had a colonoscopy and follow-up enterography and was told that she may have Crohn's but was never treated.  She then followed with Dr. Earlean Shawl and was told she did not have Crohn's and had an endoscopy about a year and a half ago which she was told was normal.  Tells me she has had continual symptoms.  She describes that for example she had a piece of lemon cake (which I do not usually eat) and 5 minutes after she felt like her epigastrium was on fire and it took 2 to 3 days for her to get over this.  If she does not eat very bland then it is very bad for her. Typically when she has symptoms she will just wait for a few days and if she is very careful everything will go back to normal.  Tells me at one point she felt like she was breathing acid.  She was on Omeprazole 20 twice daily and Famotidine but she did not feel like these medications helped at all so she weaned herself off of them.  She is currently not on a PPI or H2 blocker.    As far as stools she tells me that she had a life of constipation but over the past 2 years she has now had symptoms of terrible 10/0 lower abdominal cramping which develops  about 20 to 30 minutes after eating at times at least 3-4 times a week and she will then have liquidy watery diarrhea often multiple times and then eventually starts to feel better but her lower abdomen is sore to the touch for the next few days.  She tells me this life is miserable.  She would like some answers.    Does relay that she is somewhat nervous about going on medications with their side effects, especially if she doesn't know what we are treating.    Denies fever, chills, weight loss, blood in her stool or symptoms that awaken her from sleep.  Past Medical History:  Diagnosis Date   Anxiety    Breast cancer ([place]) 2011   BILATERAL BREAST REMOVED   Depression     Fibromyalgia    GERD (gastroesophageal reflux disease)    Hypertension    Hypothyroidism    Migraine    MIGRAINES   Overactive bladder    Pneumonia    several times   RUQ pain    Sleep apnea    uses c-pap machine   Thyroid disease     Past Surgical History:  Procedure Laterality Date   ANTERIOR AND POSTERIOR REPAIR N/A 01/05/2017   Procedure: ANTERIOR (CYSTOCELE) AND POSTERIOR REPAIR (RECTOCELE);  Surgeon: Paula Compton, MD;  Location: [facility];  Service: Gynecology;  Laterality: N/A;   APPENDECTOMY     CHOLECYSTECTOMY N/A 09/16/2015   Procedure: CHOLECYSTECTOMY;  Surgeon: [name], MD;  Location: [facility];  Service: General;  Laterality: N/A;   deviated septum surgery in 1980's     LAPAROSCOPIC LIVER CYST UNROOFING N/A 09/16/2015   Procedure: LAPAROSCOPIC LIVER CYST UNROOFING X'S 2;  Surgeon: [name], MD;  Location: [facility];  Service: General;  Laterality: N/A;   LAPAROSCOPIC VAGINAL HYSTERECTOMY WITH SALPINGO OOPHORECTOMY Bilateral 01/05/2017   Procedure: LAPAROSCOPIC ASSISTED VAGINAL HYSTERECTOMY WITH SALPINGO OOPHORECTOMY;  Surgeon: Paula Compton, MD;  Location: [facility];  Service: Gynecology;  Laterality: Bilateral;   SIMPLE MASTECTOMY W/ SENTINEL NODE BIOPSY Bilateral 2011   TUBAL LIGATION      Current Outpatient Medications  Medication Sig Dispense Refill   cetirizine (ZYRTEC) 10 MG tablet Take 1 tablet by mouth daily as needed.     chlorpheniramine (CHLOR-TRIMETON) 4 MG tablet Take 4 mg by mouth at bedtime.     Cholecalciferol (VITAMIN D3) 50 MCG (2000 UT) capsule Take 2,000 Units by mouth daily.      Coenzyme Q10 (CO Q-10) 100 MG CAPS Take 200 mg by mouth every morning.      HYDROcodone-acetaminophen (NORCO/VICODIN) 5-325 MG tablet Take 1 tablet by mouth every 6 (six) hours as needed for moderate pain or severe pain.   0   Lactobacillus-Inulin (CULTURELLE DIGESTIVE DAILY PO) Take by mouth.     levothyroxine (SYNTHROID, LEVOTHROID) 50 MCG tablet Take 50 mcg by mouth  daily before breakfast.     metoprolol succinate (TOPROL-XL) 50 MG 24 hr tablet Take 50 mg by mouth 2 (two) times daily.     NON FORMULARY CPAP: At bedtime (nightly)     omeprazole (PRILOSEC) 20 MG capsule Take 40 mg by mouth at bedtime.      Polyethyl Glycol-Propyl Glycol (SYSTANE OP) Use for dry eyes as needed     simvastatin (ZOCOR) 40 MG tablet Take 1 tablet by mouth at bedtime.     zolpidem (AMBIEN) 10 MG tablet Take 10 mg by mouth at bedtime.     No current facility-administered medications for this visit.  Allergies as of 01/05/2022 - Review Complete 11/02/2021  Allergen Reaction Noted   Aspirin Other (See Comments) 12/02/2017   Erythromycin Nausea And Vomiting 05/01/2015   Ibuprofen Other (See Comments) 09/12/2015   Tape Rash 05/01/2015   Codeine Nausea And Vomiting 03/09/2014   Other Diarrhea 12/02/2017   Penicillins Swelling 03/09/2014   Neosporin [neomycin-bacitracin zn-polymyx] Itching, Rash, and Other (See Comments) 03/09/2014    Family History  Problem Relation Age of Onset   Breast cancer Mother    CAD Father     Social History   Socioeconomic History   Marital status: Married    Spouse name: Not on file   Number of children: Not on file   Years of education: Not on file   Highest education level: Not on file  Occupational History   Not on file  Tobacco Use   Smoking status: Former    Packs/day: 1.00    Years: 5.00    Pack years: 5.00    Types: Cigarettes    Quit date: 09/12/1975    Years since quitting: 46.3   Smokeless tobacco: Never  Vaping Use   Vaping Use: Never used  Substance and Sexual Activity   Alcohol use: No   Drug use: No   Sexual activity: Not on file  Other Topics Concern   Not on file  Social History Narrative   Not on file   Social Determinants of Health   Financial Resource Strain: Not on file  Food Insecurity: Not on file  Transportation Needs: Not on file  Physical Activity: Not on file  Stress: Not on file  Social  Connections: Not on file  Intimate Partner Violence: Not on file    Review of Systems:    Constitutional: No weight loss, fever or chills Skin: No rash Cardiovascular: No chest pain Respiratory: No SOB  Gastrointestinal: See HPI and otherwise negative Genitourinary: No dysuria  Neurological: No headache, dizziness or syncope Musculoskeletal: No new muscle or joint pain Hematologic: No bleeding Psychiatric: No history of depression or anxiety   Physical Exam:  Vital signs: BP (!) 152/78    Pulse 72    Ht 5' 3"  (1.6 m)    Wt 157 lb (71.2 kg)    SpO2 98%    BMI 27.81 kg/m    Constitutional:   Pleasant Elderly Caucasian female appears to be in NAD, Well developed, Well nourished, alert and cooperative Head:  Normocephalic and atraumatic. Eyes:   PEERL, EOMI. No icterus. Conjunctiva pink. Ears:  Normal auditory acuity. Neck:  Supple Throat: Oral cavity and pharynx without inflammation, swelling or lesion.  Respiratory: Respirations even and unlabored. Lungs clear to auscultation bilaterally.   No wheezes, crackles, or rhonchi.  Cardiovascular: Normal S1, S2. No MRG. Regular rate and rhythm. No peripheral edema, cyanosis or pallor.  Gastrointestinal:  Soft, nondistended, moderate epigastric TTP, no rebound or guarding. Normal bowel sounds. No appreciable masses or hepatomegaly. Rectal:  Not performed.  Msk:  Symmetrical without gross deformities. Without edema, no deformity or joint abnormality.  Neurologic:  Alert and  oriented x4;  grossly normal neurologically.  Skin:   Dry and intact without significant lesions or rashes. Psychiatric: Demonstrates good judgement and reason without abnormal affect or behaviors.  See HPI for most recent work-up.  Assessment: 1.  GERD: With below 2.  Epigastric pain: Continues over the past 2 years, no help from Omeprazole 20 twice daily and Famotidine nightly in the past per patient so she is stopped these medicines, last  EGD a year and a half ago  was "normal" per the patient (we do not have records); consider functional dyspepsia +/- gastritis +/- IBD +/- PUD 3.  Change in bowel habits: As below alternating from diarrhea to sometimes constipation  4.  Generalized abdominal pain: Cramping pain, typically in her lower abdomen but can be felt all over prior to a liquid bowel movement about 2030 minutes after eating, previous colonoscopy/enteroscopy with question of Crohn's but no direct diagnosis; consider IBD versus IBS  Plan: 1.  After review of last enteroscopy and colonoscopy it looks like the diagnosis of IBD was uncertain.  Patient has continued with symptoms of alternating diarrhea and sometimes constipation with severe lower abdominal pain at times.  Also continues with epigastric pain and reflux and an inability to eat most foods.  Due to this recommend we repeat EGD and colonoscopy at this time to see what is going on. 2.  Scheduled patient for repeat diagnostic EGD and colonoscopy in the [place] with Dr. Silverio Decamp.  Did provide the patient a detailed list of risks for the procedures and she agrees to proceed. Patient is appropriate for endoscopic procedure(s) in the ambulatory ([place]) setting.  3.  At this time patient would like to hold off on any medications and see what is actually going on.  She is not opposed to starting them in the future if necessary. 4.  We will try to get patient's EGD records from Dr. Earlean Shawl about a year and a half ago 5.  Patient follow in clinic per recommendations from Dr. Silverio Decamp after time of procedures.  Ellouise Newer, PA-C [facility] 01/05/2022, 9:22 AM  Cc: Ginger Organ., MD   Addendum: 01/06/2022  Records received from digestive health services.  09/26/2019 patient seen by Dr. Earlean Shawl.  At that time discussed that she had Crohn's ileitis and was treated with Pentasa with good control for 3 years until she could not afford it and discontinued.  At that time describes some abdominal  burning pain and abdominal discomfort.  It was discussed that her upper pain may be peptic versus Crohn's versus functional.  Also discussed right lower quadrant pain consistent with active Crohn's.  She had her MRI at that time.  At that time recommended EGD.  12/28/2019 patient seen again for upper abdominal pain.  She had an EGD which was stated as normal suggesting upper abdominal pain was iatrogenic or functional.  She was told to taper down Omeprazole 20 mg every morning and Famotidine 20 mg nightly.  Discussed altered bowel movements that time discussed a fiber supplement.  Also discussed her diagnosis of Crohn's ileitis was hard to confirm.  Told to trial an anorectal manometry if she had continued issues with fecal incontinence.  03/28/2020 patient seen again that time her upper abdominal pain was better after she had discontinued Macrobid suggesting is likely an iatrogenic cause.  Her reflux was stable.  Told to taper off Omeprazole and continue Famotidine nightly.  We will again request last EGD.  Ellouise Newer, PA-C

## 2022-01-08 ENCOUNTER — Ambulatory Visit (AMBULATORY_SURGERY_CENTER): Payer: Medicare Other | Admitting: Gastroenterology

## 2022-01-08 ENCOUNTER — Encounter: Payer: Self-pay | Admitting: Gastroenterology

## 2022-01-08 VITALS — BP 120/65 | HR 63 | Temp 96.9°F | Resp 12 | Ht 63.0 in | Wt 157.0 lb

## 2022-01-08 DIAGNOSIS — R1010 Upper abdominal pain, unspecified: Secondary | ICD-10-CM | POA: Diagnosis not present

## 2022-01-08 DIAGNOSIS — K219 Gastro-esophageal reflux disease without esophagitis: Secondary | ICD-10-CM | POA: Diagnosis not present

## 2022-01-08 DIAGNOSIS — R194 Change in bowel habit: Secondary | ICD-10-CM | POA: Diagnosis not present

## 2022-01-08 DIAGNOSIS — R197 Diarrhea, unspecified: Secondary | ICD-10-CM | POA: Diagnosis not present

## 2022-01-08 DIAGNOSIS — K5730 Diverticulosis of large intestine without perforation or abscess without bleeding: Secondary | ICD-10-CM

## 2022-01-08 DIAGNOSIS — K2970 Gastritis, unspecified, without bleeding: Secondary | ICD-10-CM | POA: Diagnosis not present

## 2022-01-08 DIAGNOSIS — K208 Other esophagitis without bleeding: Secondary | ICD-10-CM | POA: Diagnosis not present

## 2022-01-08 DIAGNOSIS — K2100 Gastro-esophageal reflux disease with esophagitis, without bleeding: Secondary | ICD-10-CM | POA: Diagnosis not present

## 2022-01-08 DIAGNOSIS — K648 Other hemorrhoids: Secondary | ICD-10-CM | POA: Diagnosis not present

## 2022-01-08 MED ORDER — PANTOPRAZOLE SODIUM 40 MG PO TBEC: 40.0000 mg | DELAYED_RELEASE_TABLET | Freq: Every day | ORAL | 0 refills | Status: DC

## 2022-01-08 MED ORDER — SODIUM CHLORIDE 0.9 % IV SOLN: 500.0000 mL | Freq: Once | INTRAVENOUS | Status: DC

## 2022-01-08 MED ORDER — SUCRALFATE 1 GM/10ML PO SUSP: 1.0000 g | Freq: Two times a day (BID) | ORAL | 0 refills | Status: DC

## 2022-01-08 NOTE — Progress Notes (Signed)
To pacu, VSS. Report to [email or web address] 

## 2022-01-08 NOTE — Progress Notes (Signed)
Please refer to office visit note 01/05/22. No additional changes in H&P Patient is appropriate for planned procedure(s) and anesthesia in an ambulatory setting  K. Denzil Magnuson , MD [phone]

## 2022-01-08 NOTE — Op Note (Signed)
[facility] Patient Name: Kim Chavez Procedure Date: 01/08/2022 1:21 PM MRN: [id number] Endoscopist: Mauri Pole , MD Age: 76 Referring MD:  Date of Birth: [birth year] Gender: Female Account #: [id number] Procedure:                Upper GI endoscopy Indications:              Epigastric abdominal pain, Esophageal reflux                            symptoms that recur despite appropriate therapy Medicines:                Monitored Anesthesia Care Procedure:                Pre-Anesthesia Assessment:                           - Prior to the procedure, a History and Physical                            was performed, and patient medications and                            allergies were reviewed. The patient's tolerance of                            previous anesthesia was also reviewed. The risks                            and benefits of the procedure and the sedation                            options and risks were discussed with the patient.                            All questions were answered, and informed consent                            was obtained. Prior Anticoagulants: The patient has                            taken no previous anticoagulant or antiplatelet                            agents. ASA Grade Assessment: II - A patient with                            mild systemic disease. After reviewing the risks                            and benefits, the patient was deemed in                            satisfactory condition to undergo the procedure.  After obtaining informed consent, the endoscope was                            passed under direct vision. Throughout the                            procedure, the patient's blood pressure, pulse, and                            oxygen saturations were monitored continuously. The                            Endoscope was introduced through the mouth, and                             advanced to the second part of duodenum. The upper                            GI endoscopy was accomplished without difficulty.                            The patient tolerated the procedure well. Scope In: Scope Out: Findings:                 LA Grade C (one or more mucosal breaks continuous                            between tops of 2 or more mucosal folds, less than                            75% circumference) esophagitis with no bleeding and                            superficial ulcer was found 34 to 36 cm from the                            incisors. Biopsies were taken with a cold forceps                            for histology.                           Patchy mild inflammation characterized by                            congestion (edema) and erythema was found in the                            entire examined stomach. Biopsies were taken with a                            cold forceps for Helicobacter pylori testing.  The cardia and gastric fundus were normal on                            retroflexion.                           Patchy mildly erythematous mucosa without active                            bleeding and with no stigmata of bleeding was found                            in the first portion of the duodenum and in the                            second portion of the duodenum. Biopsies were taken                            with a cold forceps for histology. Complications:            No immediate complications. Estimated Blood Loss:     Estimated blood loss was minimal. Impression:               - LA Grade C reflux esophagitis with no bleeding.                            Biopsied.                           - Gastritis. Biopsied.                           - Erythematous duodenopathy. Biopsied. Recommendation:           - Resume previous diet.                           - Continue present medications.                           - Await pathology  results.                           - No ibuprofen, naproxen, or other non-steroidal                            anti-inflammatory drugs.                           - Use Protonix (pantoprazole) 40 mg PO daily for 3                            months.                           - Use sucralfate tablets 1 gram PO BID for 2 weeks.                           -  Return to GI office in 2 months. Mauri Pole, MD 01/08/2022 2:06:50 PM This report has been signed electronically.

## 2022-01-08 NOTE — Patient Instructions (Addendum)
Await pathology results from the biopsies taken today.   Avoid NSAIDS (Aspirin, Ibuprofen, Aleve, Naproxen), you may use Tylenol as needed.  Use sucrafate tablets 1 gram by mouth twice a day for 2 weeks.  Use protonix 40 mg by mouth once a day for 3 months.  Resume previous diet and medications.  No repeat colonoscopy.  Information on diverticulosis and hemorrhoids given to you today.   YOU HAD AN ENDOSCOPIC PROCEDURE TODAY AT [facility]:   Refer to the procedure report that was given to you for any specific questions about what was found during the examination.  If the procedure report does not answer your questions, please call your gastroenterologist to clarify.  If you requested that your care partner not be given the details of your procedure findings, then the procedure report has been included in a sealed envelope for you to review at your convenience later.  YOU SHOULD EXPECT: Some feelings of bloating in the abdomen. Passage of more gas than usual.  Walking can help get rid of the air that was put into your GI tract during the procedure and reduce the bloating. If you had a lower endoscopy (such as a colonoscopy or flexible sigmoidoscopy) you may notice spotting of blood in your stool or on the toilet paper. If you underwent a bowel prep for your procedure, you may not have a normal bowel movement for a few days.  Please Note:  You might notice some irritation and congestion in your nose or some drainage.  This is from the oxygen used during your procedure.  There is no need for concern and it should clear up in a day or so.  SYMPTOMS TO REPORT IMMEDIATELY:  Following lower endoscopy (colonoscopy or flexible sigmoidoscopy):  Excessive amounts of blood in the stool  Significant tenderness or worsening of abdominal pains  Swelling of the abdomen that is new, acute  Fever of 100F or higher  Following upper endoscopy (EGD)  Vomiting of blood or coffee ground  material  New chest pain or pain under the shoulder blades  Painful or persistently difficult swallowing  New shortness of breath  Fever of 100F or higher  Black, tarry-looking stools  For urgent or emergent issues, a gastroenterologist can be reached at any hour by calling [phone]. Do not use MyChart messaging for urgent concerns.    DIET:  We do recommend a small meal at first, but then you may proceed to your regular diet.  Drink plenty of fluids but you should avoid alcoholic beverages for 24 hours.  ACTIVITY:  You should plan to take it easy for the rest of today and you should NOT DRIVE or use heavy machinery until tomorrow (because of the sedation medicines used during the test).    FOLLOW UP: Our staff will call the number listed on your records 48-72 hours following your procedure to check on you and address any questions or concerns that you may have regarding the information given to you following your procedure. If we do not reach you, we will leave a message.  We will attempt to reach you two times.  During this call, we will ask if you have developed any symptoms of COVID 19. If you develop any symptoms (ie: fever, flu-like symptoms, shortness of breath, cough etc.) before then, please call [phone].  If you test positive for Covid 19 in the 2 weeks post procedure, please call and report this information to [place].    If any  biopsies were taken you will be contacted by phone or by letter within the next 1-3 weeks.  Please call us at [phone] if you have not heard about the biopsies in 3 weeks.    SIGNATURES/CONFIDENTIALITY: You and/or your care partner have signed paperwork which will be entered into your electronic medical record.  These signatures attest to the fact that that the information above on your After Visit Summary has been reviewed and is understood.  Full responsibility of the confidentiality of this discharge information lies with you and/or your  care-partner.

## 2022-01-08 NOTE — Progress Notes (Signed)
Called to room to assist during endoscopic procedure.  Patient ID and intended procedure confirmed with present staff. Received instructions for my participation in the procedure from the performing physician.  

## 2022-01-08 NOTE — Op Note (Signed)
[place] Patient Name: Kim Chavez Procedure Date: 01/08/2022 1:21 PM MRN: [id number] Endoscopist: Mauri Pole , MD Age: 76 Referring MD:  Date of Birth: 11-Feb-1946 Gender: Female Account #: [id number] Procedure:                Colonoscopy Indications:              Clinically significant diarrhea of unexplained                            origin, Upper abdominal pain Medicines:                Monitored Anesthesia Care Procedure:                Pre-Anesthesia Assessment:                           - Prior to the procedure, a History and Physical                            was performed, and patient medications and                            allergies were reviewed. The patient's tolerance of                            previous anesthesia was also reviewed. The risks                            and benefits of the procedure and the sedation                            options and risks were discussed with the patient.                            All questions were answered, and informed consent                            was obtained. Prior Anticoagulants: The patient has                            taken no previous anticoagulant or antiplatelet                            agents. ASA Grade Assessment: II - A patient with                            mild systemic disease. After reviewing the risks                            and benefits, the patient was deemed in                            satisfactory condition to undergo the procedure.  After obtaining informed consent, the colonoscope                            was passed under direct vision. Throughout the                            procedure, the patient's blood pressure, pulse, and                            oxygen saturations were monitored continuously. The                            Olympus PCF-H190DL [id number]) Colonoscope was                            introduced through the anus  and advanced to the the                            cecum, identified by appendiceal orifice and                            ileocecal valve. The colonoscopy was performed                            without difficulty. The patient tolerated the                            procedure well. The quality of the bowel                            preparation was excellent. The ileocecal valve,                            appendiceal orifice, and rectum were photographed. Scope In: 1:41:20 PM Scope Out: 1:59:59 PM Scope Withdrawal Time: 0 hours 10 minutes 31 seconds  Total Procedure Duration: 0 hours 18 minutes 39 seconds  Findings:                 The perianal and digital rectal examinations were                            normal.                           Normal mucosa was found in the entire colon.                            Biopsies for histology were taken with a cold                            forceps from the right colon and left colon for                            evaluation of microscopic colitis.  Scattered small-mouthed diverticula were found in                            the sigmoid colon and descending colon.                           Non-bleeding internal hemorrhoids were found during                            retroflexion. The hemorrhoids were small. Complications:            No immediate complications. Estimated Blood Loss:     Estimated blood loss was minimal. Impression:               - Normal mucosa in the entire examined colon.                            Biopsied.                           - Mild diverticulosis in the sigmoid colon and in                            the descending colon.                           - Non-bleeding internal hemorrhoids. Recommendation:           - Patient has a contact number available for                            emergencies. The signs and symptoms of potential                            delayed complications were  discussed with the                            patient. Return to normal activities tomorrow.                            Written discharge instructions were provided to the                            patient.                           - Resume previous diet.                           - Continue present medications.                           - Await pathology results.                           - No repeat colonoscopy. Mauri Pole, MD 01/08/2022 2:10:02 PM This report has been signed electronically.

## 2022-01-11 ENCOUNTER — Telehealth: Payer: Self-pay | Admitting: Gastroenterology

## 2022-01-11 ENCOUNTER — Other Ambulatory Visit: Payer: Self-pay

## 2022-01-11 DIAGNOSIS — D1801 Hemangioma of skin and subcutaneous tissue: Secondary | ICD-10-CM | POA: Diagnosis not present

## 2022-01-11 DIAGNOSIS — L820 Inflamed seborrheic keratosis: Secondary | ICD-10-CM | POA: Diagnosis not present

## 2022-01-11 DIAGNOSIS — K219 Gastro-esophageal reflux disease without esophagitis: Secondary | ICD-10-CM

## 2022-01-11 DIAGNOSIS — D2262 Melanocytic nevi of left upper limb, including shoulder: Secondary | ICD-10-CM | POA: Diagnosis not present

## 2022-01-11 DIAGNOSIS — D225 Melanocytic nevi of trunk: Secondary | ICD-10-CM | POA: Diagnosis not present

## 2022-01-11 DIAGNOSIS — D2272 Melanocytic nevi of left lower limb, including hip: Secondary | ICD-10-CM | POA: Diagnosis not present

## 2022-01-11 DIAGNOSIS — L814 Other melanin hyperpigmentation: Secondary | ICD-10-CM | POA: Diagnosis not present

## 2022-01-11 DIAGNOSIS — D2271 Melanocytic nevi of right lower limb, including hip: Secondary | ICD-10-CM | POA: Diagnosis not present

## 2022-01-11 DIAGNOSIS — Z85828 Personal history of other malignant neoplasm of skin: Secondary | ICD-10-CM | POA: Diagnosis not present

## 2022-01-11 DIAGNOSIS — D2239 Melanocytic nevi of other parts of face: Secondary | ICD-10-CM | POA: Diagnosis not present

## 2022-01-11 DIAGNOSIS — L821 Other seborrheic keratosis: Secondary | ICD-10-CM | POA: Diagnosis not present

## 2022-01-11 MED ORDER — SUCRALFATE 1 G PO TABS: 1.0000 g | ORAL_TABLET | Freq: Two times a day (BID) | ORAL | 0 refills | Status: DC

## 2022-01-11 MED ORDER — PANTOPRAZOLE SODIUM 40 MG PO TBEC: 40.0000 mg | DELAYED_RELEASE_TABLET | Freq: Every day | ORAL | 0 refills | Status: DC

## 2022-01-11 NOTE — Telephone Encounter (Signed)
Pt states that protonix was not at pharmacy and carafate was sent in as liquid.  Resent both prescriptions to [facility] on [address].  Instructed patient to call with any further issues.

## 2022-01-11 NOTE — Telephone Encounter (Signed)
Inbound call from patient states the medications she picked up 01/08/22 are not right and need a call back

## 2022-01-12 ENCOUNTER — Telehealth: Payer: Self-pay

## 2022-01-12 NOTE — Telephone Encounter (Signed)
°  Follow up Call-  Call back number 01/08/2022  Post procedure Call Back phone  # [phone]  Permission to leave phone message Yes  Some recent data might be hidden     Patient questions:  Do you have a fever, pain , or abdominal swelling? No. Pain Score  0 *  Have you tolerated food without any problems? Yes.    Have you been able to return to your normal activities? Yes.    Do you have any questions about your discharge instructions: Diet   No. Medications  No. Follow up visit  No.  Do you have questions or concerns about your Care? No.  Actions: * If pain score is 4 or above: No action needed, pain <4.

## 2022-01-19 DIAGNOSIS — R350 Frequency of micturition: Secondary | ICD-10-CM | POA: Diagnosis not present

## 2022-01-28 ENCOUNTER — Encounter: Payer: Self-pay | Admitting: Gastroenterology

## 2022-01-28 DIAGNOSIS — Z9013 Acquired absence of bilateral breasts and nipples: Secondary | ICD-10-CM | POA: Diagnosis not present

## 2022-01-28 DIAGNOSIS — R918 Other nonspecific abnormal finding of lung field: Secondary | ICD-10-CM | POA: Diagnosis not present

## 2022-01-28 DIAGNOSIS — K760 Fatty (change of) liver, not elsewhere classified: Secondary | ICD-10-CM | POA: Diagnosis not present

## 2022-01-28 DIAGNOSIS — E039 Hypothyroidism, unspecified: Secondary | ICD-10-CM | POA: Diagnosis not present

## 2022-01-28 DIAGNOSIS — I10 Essential (primary) hypertension: Secondary | ICD-10-CM | POA: Diagnosis not present

## 2022-01-28 DIAGNOSIS — D0502 Lobular carcinoma in situ of left breast: Secondary | ICD-10-CM | POA: Diagnosis not present

## 2022-01-28 DIAGNOSIS — T85848S Pain due to other internal prosthetic devices, implants and grafts, sequela: Secondary | ICD-10-CM | POA: Diagnosis not present

## 2022-02-17 DIAGNOSIS — K760 Fatty (change of) liver, not elsewhere classified: Secondary | ICD-10-CM | POA: Diagnosis not present

## 2022-02-17 DIAGNOSIS — K7689 Other specified diseases of liver: Secondary | ICD-10-CM | POA: Diagnosis not present

## 2022-02-17 DIAGNOSIS — N281 Cyst of kidney, acquired: Secondary | ICD-10-CM | POA: Diagnosis not present

## 2022-02-17 DIAGNOSIS — Z9049 Acquired absence of other specified parts of digestive tract: Secondary | ICD-10-CM | POA: Diagnosis not present

## 2022-02-19 ENCOUNTER — Other Ambulatory Visit: Payer: Self-pay | Admitting: Gastroenterology

## 2022-03-11 ENCOUNTER — Ambulatory Visit: Payer: Medicare Other | Admitting: Gastroenterology

## 2022-04-06 ENCOUNTER — Other Ambulatory Visit: Payer: Self-pay | Admitting: Gastroenterology

## 2022-04-06 DIAGNOSIS — K219 Gastro-esophageal reflux disease without esophagitis: Secondary | ICD-10-CM

## 2022-04-13 ENCOUNTER — Other Ambulatory Visit: Payer: Self-pay | Admitting: Gastroenterology

## 2022-05-06 DIAGNOSIS — H52203 Unspecified astigmatism, bilateral: Secondary | ICD-10-CM | POA: Diagnosis not present

## 2022-05-06 DIAGNOSIS — H349 Unspecified retinal vascular occlusion: Secondary | ICD-10-CM | POA: Diagnosis not present

## 2022-05-28 ENCOUNTER — Encounter: Payer: Self-pay | Admitting: Gastroenterology

## 2022-05-28 ENCOUNTER — Ambulatory Visit (INDEPENDENT_AMBULATORY_CARE_PROVIDER_SITE_OTHER): Payer: Medicare Other | Admitting: Gastroenterology

## 2022-05-28 VITALS — BP 140/72 | HR 67 | Ht 63.0 in | Wt 165.0 lb

## 2022-05-28 DIAGNOSIS — K219 Gastro-esophageal reflux disease without esophagitis: Secondary | ICD-10-CM

## 2022-05-28 DIAGNOSIS — K589 Irritable bowel syndrome without diarrhea: Secondary | ICD-10-CM

## 2022-05-28 DIAGNOSIS — R159 Full incontinence of feces: Secondary | ICD-10-CM | POA: Diagnosis not present

## 2022-05-28 MED ORDER — PANTOPRAZOLE SODIUM 40 MG PO TBEC: 40.0000 mg | DELAYED_RELEASE_TABLET | Freq: Every day | ORAL | 0 refills | Status: DC

## 2022-05-28 MED ORDER — FAMOTIDINE 20 MG PO TABS: 20.0000 mg | ORAL_TABLET | Freq: Every day | ORAL | 3 refills | Status: DC

## 2022-05-28 NOTE — Patient Instructions (Addendum)
Take Protonix 30 minutes before lunch  Use Pepcid 20 mg at bedtime as needed  Increase water intake to 8 cups per day  Take Benefiber 1 tablespoon twice a day with meals  Do a Lactose free diet for 1-2 weeks  Follow up in 3 months  We will refer you to [facility] and they will call you with an appointment     Lactose-Free Diet, Adult If you have lactose intolerance, you are not able to digest lactose. Lactose is a natural sugar found mainly in dairy milk and dairy products. A lactose-free diet can help you avoid foods and beverages that contain lactose. What are tips for following this plan? Reading food labels Do not consume foods, beverages, vitamins, minerals, or medicines containing lactose. Read ingredient lists carefully. Look for the words "lactose-free" on labels. Meal planning Use alternatives to dairy milk and foods made with milk products. These include the following: Lactose-free milk. Soy milk with added calcium and vitamin D. Almond milk, coconut milk, rice milk, or other nondairy milk alternatives with added calcium and vitamin D. Note that a lot of these are low in protein. Soy products, such as soy yogurt, soy cheese, soy ice cream, and soy-based sour cream. Other nut milk products, such as almond yogurt, almond cheese, cashew yogurt, cashew cheese, cashew ice cream, coconut yogurt, and coconut ice cream. Medicines, vitamins, and supplements Use lactase enzyme drops or tablets as directed by your health care provider. Make sure you get enough calcium and vitamin D in your diet. A lactose-free eating plan can be lacking in these important nutrients. Take calcium and vitamin D supplements as directed by your health care provider. Talk with your health care provider about supplements if you are not able to get enough calcium and vitamin D from food. What foods should I eat?  Fruits All fresh, canned, frozen, or dried fruits and fruit juices that are not  processed with lactose. Vegetables All fresh, frozen, and canned vegetables without cheese, cream, or butter sauces. Grains Any that are not made with dairy milk or dairy products. Meats and other proteins Any meat, fish, poultry, and other protein sources that are not made with dairy milk or dairy products. Fats and oils Any that are not made with dairy milk or dairy products. Sweets and desserts Any that are not made with dairy milk or dairy products. Seasonings and condiments Any that are not made with dairy milk or dairy products. Calcium Calcium is found in many foods that contain lactose and is important for bone health. The amount of calcium you need depends on your age: Adults younger than 50 years: 1,000 mg of calcium a day. Adults older than 50 years: 1,200 mg of calcium a day. If you are not getting enough calcium, you may get it from other sources, including: Orange juice that has been fortified with calcium. This means that calcium has been added to the product. There are 300-350 mg of calcium in 1 cup (237 mL) of calcium-fortified orange juice. Soy milk fortified with calcium. There are 300-400 mg of calcium in 1 cup (237 mL) of calcium-fortified soy milk. Rice or almond milk fortified with calcium. There are 300 mg of calcium in 1 cup (237 mL) of calcium-fortified rice or almond milk. Breakfast cereals fortified with calcium. There are 100-1,000 mg of calcium in calcium-fortified breakfast cereals. Spinach, cooked. There are 145 mg of calcium in  cup (90 g) of cooked spinach. Edamame, cooked. There are 130 mg  of calcium in  cup (47 g) of cooked edamame. Collard greens, cooked. There are 125 mg of calcium in  cup (85 g) of cooked collard greens. Kale, frozen or cooked. There are 90 mg of calcium in  cup (59 g) of cooked or frozen kale. Almonds. There are 95 mg of calcium in  cup (35 g) of almonds. Broccoli, cooked. There are 60 mg of calcium in 1 cup (156 g) of cooked  broccoli. The items listed above may not be a complete list of foods and beverages you can eat. Contact a dietitian for more options. What foods should I avoid? Lactose is found in dairy milk and dairy products, such as: Yogurt. Cheese. Butter. Margarine. Sour cream. Cream. Whipped toppings and creamers. Ice cream and other dairy-based desserts. Lactose is also found in foods or products made with dairy milk or milk ingredients. To find out whether a food contains dairy milk or a milk ingredient, look at the ingredients list. Avoid foods with the statement "May contain milk" and foods that contain: Milk powder. Whey. Curd. Lactose. Lactoglobulin. The items listed above may not be a complete list of foods and beverages to avoid. Contact a dietitian for more information. Where to find more information [employer] of Diabetes and Digestive and Kidney Diseases: [email or web address] Summary If you are lactose intolerant, it means that you are not able to digest lactose, a natural sugar found in milk and milk products. Following a lactose-free diet can help you manage this condition. Calcium is important for bone health and is found in many foods that contain lactose. Talk with your health care provider about other sources of calcium. This information is not intended to replace advice given to you by your health care provider. Make sure you discuss any questions you have with your health care provider. Document Revised: 11/04/2020 Document Reviewed: 11/04/2020 Elsevier Patient Education  [place].  If you are age 31 or older, your body mass index should be between 23-30. Your Body mass index is 29.23 kg/m. If this is out of the aforementioned range listed, please consider follow up with your Primary Care Provider.  If you are age 11 or younger, your body mass index should be between 19-25. Your Body mass index is 29.23 kg/m. If this is out of the aformentioned range listed,  please consider follow up with your Primary Care Provider.   ________________________________________________________  The [facility] providers would like to encourage you to use [facility] to communicate with providers for non-urgent requests or questions.  Due to long hold times on the telephone, sending your provider a message by [facility] may be a faster and more efficient way to get a response.  Please allow 48 business hours for a response.  Please remember that this is for non-urgent requests.  _______________________________________________________   Thank you for choosing [facility]  Kavitha Nandigam,MD

## 2022-05-28 NOTE — Progress Notes (Incomplete)
Kim Chavez    [id number]    01-25-1946  Primary Care Physician:Shaw, Emily Filbert., MD  Referring Physician: Ginger Organ., MD [address] [place],  [place] [address]   Chief complaint:  ***  HPI:  [place] worse in the evening     EGD 01/08/22 - LA Grade C reflux esophagitis with no bleeding. Biopsied.  Showed reflux related inflammation, negative for dysplasia or Barrett's esophagus. - Gastritis. Biopsied.  Negative for H. pylori bacterial infection, dysplasia or metaplasia. - Erythematous duodenopathy. Biopsied.  Colonoscopy 01/08/22 - Normal mucosa in the entire examined colon. Biopsied.  Negative for microscopic colitis - Mild diverticulosis in the sigmoid colon and in the descending colon. - Non-bleeding internal hemorrhoids.   Outpatient Encounter Medications as of 05/28/2022  Medication Sig   cetirizine (ZYRTEC) 10 MG tablet Take 1 tablet by mouth daily as needed.   chlorpheniramine (CHLOR-TRIMETON) 4 MG tablet Take 4 mg by mouth at bedtime.   Cholecalciferol (VITAMIN D3) 50 MCG (2000 UT) capsule Take 2,000 Units by mouth daily.    Coenzyme Q10 (CO Q-10) 100 MG CAPS Take 200 mg by mouth every morning.    HYDROcodone-acetaminophen (NORCO/VICODIN) 5-325 MG tablet Take 1 tablet by mouth every 6 (six) hours as needed for moderate pain or severe pain.    Lactobacillus-Inulin (CULTURELLE DIGESTIVE DAILY PO) Take by mouth. (Patient not taking: Reported on 01/08/2022)   levothyroxine (SYNTHROID, LEVOTHROID) 50 MCG tablet Take 50 mcg by mouth daily before breakfast.   metoprolol succinate (TOPROL-XL) 50 MG 24 hr tablet Take 50 mg by mouth 2 (two) times daily.   NON FORMULARY CPAP: At bedtime (nightly)   NON FORMULARY Estradiol cream .52m   pantoprazole (PROTONIX) 40 MG tablet TAKE 1 TABLET(40 MG) BY MOUTH DAILY   Polyethyl Glycol-Propyl Glycol (SYSTANE OP) Use for dry eyes as needed   simvastatin (ZOCOR) 40 MG tablet Take 1 tablet by mouth at  bedtime.   sucralfate (CARAFATE) 1 g tablet TAKE 1 TABLET(1 GRAM) BY MOUTH TWICE DAILY FOR 14 DAYS   zolpidem (AMBIEN) 10 MG tablet Take 10 mg by mouth at bedtime.   No facility-administered encounter medications on file as of 05/28/2022.    Allergies as of 05/28/2022 - Review Complete 01/08/2022  Allergen Reaction Noted   Aspirin Other (See Comments) 12/02/2017   Erythromycin Nausea And Vomiting 05/01/2015   Ibuprofen Other (See Comments) 09/12/2015   Tape Rash 05/01/2015   Codeine Nausea And Vomiting 03/09/2014   Other Diarrhea 12/02/2017   Penicillins Swelling 03/09/2014   Neosporin [neomycin-bacitracin zn-polymyx] Itching, Rash, and Other (See Comments) 03/09/2014    Past Medical History:  Diagnosis Date   Anxiety    [place]   BILATERAL BREAST REMOVED   Depression    Fibromyalgia    GERD (gastroesophageal reflux disease)    Hypertension    Hypothyroidism    Migraine    MIGRAINES   Overactive bladder    Pneumonia    several times   RUQ pain    Sleep apnea    uses c-pap machine   Thyroid disease     Past Surgical History:  Procedure Laterality Date   ANTERIOR AND POSTERIOR REPAIR N/A 01/05/2017   Procedure: ANTERIOR (CYSTOCELE) AND POSTERIOR REPAIR (RECTOCELE);  Surgeon: KPaula Compton MD;  [facility];  Service: Gynecology;  Laterality: N/A;   APPENDECTOMY     CHOLECYSTECTOMY N/A 09/16/2015   Procedure: CHOLECYSTECTOMY;  Surgeon: FDorris Fetch  Barry Dienes, MD;  Location: [facility];  Service: General;  Laterality: N/A;   deviated septum surgery in 1980's     LAPAROSCOPIC LIVER CYST UNROOFING N/A 09/16/2015   Procedure: LAPAROSCOPIC LIVER CYST UNROOFING X'S 2;  Surgeon: [name], MD;  Location: [facility];  Service: General;  Laterality: N/A;   LAPAROSCOPIC VAGINAL HYSTERECTOMY WITH SALPINGO OOPHORECTOMY Bilateral 01/05/2017   Procedure: LAPAROSCOPIC ASSISTED VAGINAL HYSTERECTOMY WITH SALPINGO OOPHORECTOMY;  Surgeon: Paula Compton, MD;  Location: [facility];   Service: Gynecology;  Laterality: Bilateral;   LIVER SURGERY     SIMPLE MASTECTOMY W/ SENTINEL NODE BIOPSY Bilateral 12/13/2009   TUBAL LIGATION      Family History  Problem Relation Age of Onset   Breast cancer Mother    Colon polyps Mother    Diabetes Mother    CAD Father    Diabetes Maternal Grandmother    Colon polyps Maternal Grandfather    Celiac disease Son    Colon cancer Neg Hx    Stomach cancer Neg Hx    Esophageal cancer Neg Hx    Pancreatic cancer Neg Hx     Social History   Socioeconomic History   Marital status: Married    Spouse name: Not on file   Number of children: Not on file   Years of education: Not on file   Highest education level: Not on file  Occupational History   Not on file  Tobacco Use   Smoking status: Former    Packs/day: 1.00    Years: 5.00    Total pack years: 5.00    Types: Cigarettes    Quit date: 09/12/1975    Years since quitting: 46.7   Smokeless tobacco: Never  Vaping Use   Vaping Use: Never used  Substance and Sexual Activity   Alcohol use: No   Drug use: No   Sexual activity: Not on file  Other Topics Concern   Not on file  Social History Narrative   Not on file   Social Determinants of Health   Financial Resource Strain: Low Risk  (11/15/2018)   Overall Financial Resource Strain (CARDIA)    Difficulty of Paying Living Expenses: Not very hard  Food Insecurity: No Food Insecurity (11/15/2018)   Hunger Vital Sign    Worried About Running Out of Food in the Last Year: Never true    Ran Out of Food in the Last Year: Never true  Transportation Needs: No Transportation Needs (11/15/2018)   PRAPARE - Hydrologist (Medical): No    Lack of Transportation (Non-Medical): No  Physical Activity: Inactive (11/15/2018)   Exercise Vital Sign    Days of Exercise per Week: 0 days    Minutes of Exercise per Session: 0 min  Stress: Stress Concern Present (11/15/2018)   [place]    Feeling of Stress : To some extent  Social Connections: Moderately Integrated (11/15/2018)   Social Connection and Isolation Panel [NHANES]    Frequency of Communication with Friends and Family: More than three times a week    Frequency of Social Gatherings with Friends and Family: Once a week    Attends Religious Services: More than 4 times per year    Active Member of [employer] or Organizations: No    Attends Archivist Meetings: Never    Marital Status: Married  Human resources officer Violence: Not At Risk (11/15/2018)   Humiliation, Afraid, Rape, and Kick questionnaire  Fear of Current or Ex-Partner: No    Emotionally Abused: No    Physically Abused: No    Sexually Abused: No      Review of systems: All other review of systems negative except as mentioned in the HPI.   Physical Exam: There were no vitals filed for this visit. There is no height or weight on file to calculate BMI. Gen:      No acute distress HEENT:  sclera anicteric Abd:      soft, non-tender; no palpable masses, no distension Ext:    No edema Neuro: alert and oriented x 3 Psych: normal mood and affect  Data Reviewed:  Reviewed labs, radiology imaging, old records and pertinent past GI work up   Assessment and Plan/Recommendations:  ***  This visit required *** minutes of patient care (this includes precharting, chart review, review of results, face-to-face time used for counseling as well as treatment plan and follow-up. The patient was provided an opportunity to ask questions and all were answered. The patient agreed with the plan and demonstrated an understanding of the instructions.  Damaris Hippo , MD    CC: Ginger Organ., MD

## 2022-06-07 ENCOUNTER — Encounter: Payer: Self-pay | Admitting: Gastroenterology

## 2022-06-07 ENCOUNTER — Other Ambulatory Visit: Payer: Self-pay | Admitting: Gastroenterology

## 2022-06-22 DIAGNOSIS — H3500 Unspecified background retinopathy: Secondary | ICD-10-CM | POA: Diagnosis not present

## 2022-06-22 DIAGNOSIS — H02831 Dermatochalasis of right upper eyelid: Secondary | ICD-10-CM | POA: Diagnosis not present

## 2022-06-22 DIAGNOSIS — H02834 Dermatochalasis of left upper eyelid: Secondary | ICD-10-CM | POA: Diagnosis not present

## 2022-06-22 DIAGNOSIS — D3131 Benign neoplasm of right choroid: Secondary | ICD-10-CM | POA: Diagnosis not present

## 2022-07-27 DIAGNOSIS — H35033 Hypertensive retinopathy, bilateral: Secondary | ICD-10-CM | POA: Diagnosis not present

## 2022-07-27 DIAGNOSIS — H35363 Drusen (degenerative) of macula, bilateral: Secondary | ICD-10-CM | POA: Diagnosis not present

## 2022-07-27 DIAGNOSIS — H43813 Vitreous degeneration, bilateral: Secondary | ICD-10-CM | POA: Diagnosis not present

## 2022-07-27 DIAGNOSIS — H348312 Tributary (branch) retinal vein occlusion, right eye, stable: Secondary | ICD-10-CM | POA: Diagnosis not present

## 2022-07-30 ENCOUNTER — Telehealth: Payer: Self-pay | Admitting: *Deleted

## 2022-07-30 MED ORDER — SUCRALFATE 1 G PO TABS: 1.0000 g | ORAL_TABLET | Freq: Two times a day (BID) | ORAL | 2 refills | Status: DC

## 2022-07-30 NOTE — Telephone Encounter (Signed)
Refill request for Carafate 1 gm, sent today to pharmacy electronically

## 2022-08-09 DIAGNOSIS — E041 Nontoxic single thyroid nodule: Secondary | ICD-10-CM | POA: Diagnosis not present

## 2022-08-09 DIAGNOSIS — E039 Hypothyroidism, unspecified: Secondary | ICD-10-CM | POA: Diagnosis not present

## 2022-09-14 ENCOUNTER — Encounter: Payer: Self-pay | Admitting: Gastroenterology

## 2022-09-14 ENCOUNTER — Ambulatory Visit (INDEPENDENT_AMBULATORY_CARE_PROVIDER_SITE_OTHER): Payer: Medicare Other | Admitting: Gastroenterology

## 2022-09-14 ENCOUNTER — Other Ambulatory Visit: Payer: Self-pay

## 2022-09-14 VITALS — BP 130/80 | HR 66 | Ht 63.0 in | Wt 160.0 lb

## 2022-09-14 DIAGNOSIS — K219 Gastro-esophageal reflux disease without esophagitis: Secondary | ICD-10-CM | POA: Diagnosis not present

## 2022-09-14 DIAGNOSIS — R159 Full incontinence of feces: Secondary | ICD-10-CM | POA: Diagnosis not present

## 2022-09-14 DIAGNOSIS — M6289 Other specified disorders of muscle: Secondary | ICD-10-CM

## 2022-09-14 DIAGNOSIS — K2210 Ulcer of esophagus without bleeding: Secondary | ICD-10-CM | POA: Diagnosis not present

## 2022-09-14 MED ORDER — BENEFIBER PO POWD: ORAL | 0 refills | Status: AC

## 2022-09-14 MED ORDER — DEXLANSOPRAZOLE 60 MG PO CPDR: 60.0000 mg | DELAYED_RELEASE_CAPSULE | Freq: Every day | ORAL | 3 refills | Status: DC

## 2022-09-14 NOTE — Progress Notes (Signed)
Kim Chavez    [id number]    16-Dec-1945  Primary Care Physician:Shaw, Emily Filbert., MD  Referring Physician: Ginger Organ., MD [address] [place],  [place] [address]   Chief complaint:  Fecal incontinence, GERD  HPI:  76 year old very pleasant female here for follow-up visit for GERD with complaints of fecal incontinence Continues to have irregular bowel habits with alternating constipation and diarrhea, on average has a bowel movement every 2 to 3 days  She continues to have breakthrough [place], is worse in the evening.  She has to clear her throat mostly at bedtime.  Symptoms are slightly better on pantoprazole, Pepcid and sucralfate.  She is struggling to take oral meds and time them appropriately.  She is also experiencing intermittent cramping after meals which improves with a bowel movement.  Denies any rectal bleeding or melena.  She has runny nose whenever she has a meal and is concerned about it.  No dysphagia or odynophagia.  No vomiting or regurgitation. No unintentional weight loss or decreased appetite.   She has intermittent fecal incontinence with loose stool and fecal urgency.  Denies increased bowel frequency or watery diarrhea.     EGD 01/08/22 - LA Grade C reflux esophagitis with no bleeding. Biopsied.  Showed reflux related inflammation, negative for dysplasia or Barrett's esophagus. - Gastritis. Biopsied.  Negative for H. pylori bacterial infection, dysplasia or metaplasia. - Erythematous duodenopathy. Biopsied.   Colonoscopy 01/08/22 - Normal mucosa in the entire examined colon. Biopsied.  Negative for microscopic colitis - Mild diverticulosis in the sigmoid colon and in the descending colon. - Non-bleeding internal hemorrhoids.   Outpatient Encounter Medications as of 09/14/2022  Medication Sig   azelastine (ASTELIN) 0.1 % nasal spray Place 2 sprays into both nostrils 2 (two) times daily. Use in each nostril as directed    cetirizine (ZYRTEC) 10 MG tablet Take 1 tablet by mouth daily as needed.   chlorpheniramine (CHLOR-TRIMETON) 4 MG tablet Take 4 mg by mouth at bedtime.   Cholecalciferol (VITAMIN D3) 50 MCG (2000 UT) capsule Take 2,000 Units by mouth daily.    Coenzyme Q10 (CO Q-10) 100 MG CAPS Take 200 mg by mouth every morning.    famotidine (PEPCID) 20 MG tablet Take 1 tablet (20 mg total) by mouth at bedtime.   HYDROcodone-acetaminophen (NORCO/VICODIN) 5-325 MG tablet Take 1 tablet by mouth every 6 (six) hours as needed for moderate pain or severe pain.    hydrocortisone 2.5 % cream hydrocortisone 2.5 % topical cream  APPLY TO OUTER EARS 1 TO 2 TIMES PER DAY AS NEEDED FOR EAR ITCHING   Lactobacillus-Inulin (CULTURELLE DIGESTIVE DAILY PO) Take by mouth.   levothyroxine (SYNTHROID, LEVOTHROID) 50 MCG tablet Take 50 mcg by mouth daily before breakfast.   metoprolol succinate (TOPROL-XL) 50 MG 24 hr tablet Take 50 mg by mouth 2 (two) times daily.   NON FORMULARY CPAP: At bedtime (nightly)   NON FORMULARY Estradiol cream .81m   pantoprazole (PROTONIX) 40 MG tablet Take 1 tablet (40 mg total) by mouth daily. 30 minutes before lunch   Polyethyl Glycol-Propyl Glycol (SYSTANE OP) Use for dry eyes as needed   simvastatin (ZOCOR) 40 MG tablet Take 1 tablet by mouth at bedtime.   sucralfate (CARAFATE) 1 g tablet Take 1 tablet (1 g total) by mouth 2 (two) times daily.   zolpidem (AMBIEN) 10 MG tablet Take 10 mg by mouth at bedtime.   No facility-administered  encounter medications on file as of 09/14/2022.    Allergies as of 09/14/2022 - Review Complete 09/14/2022  Allergen Reaction Noted   Aspirin Other (See Comments) 12/02/2017   Erythromycin Nausea And Vomiting 05/01/2015   Ibuprofen Other (See Comments) 09/12/2015   Tape Rash 05/01/2015   Codeine Nausea And Vomiting 03/09/2014   Other Diarrhea 12/02/2017   Penicillins Swelling 03/09/2014   Neosporin [neomycin-bacitracin zn-polymyx] Itching, Rash, and Other  (See Comments) 03/09/2014    Past Medical History:  Diagnosis Date   Anxiety    Breast cancer ([place]) 2011   BILATERAL BREAST REMOVED   Depression    Fibromyalgia    GERD (gastroesophageal reflux disease)    Hypertension    Hypothyroidism    Migraine    MIGRAINES   Overactive bladder    Pneumonia    several times   RUQ pain    Sleep apnea    uses c-pap machine   Thyroid disease     Past Surgical History:  Procedure Laterality Date   ANTERIOR AND POSTERIOR REPAIR N/A 01/05/2017   Procedure: ANTERIOR (CYSTOCELE) AND POSTERIOR REPAIR (RECTOCELE);  Surgeon: Paula Compton, MD;  Location: [facility];  Service: Gynecology;  Laterality: N/A;   APPENDECTOMY     CHOLECYSTECTOMY N/A 09/16/2015   Procedure: CHOLECYSTECTOMY;  Surgeon: [name], MD;  Location: [facility];  Service: General;  Laterality: N/A;   deviated septum surgery in 1980's     LAPAROSCOPIC LIVER CYST UNROOFING N/A 09/16/2015   Procedure: LAPAROSCOPIC LIVER CYST UNROOFING X'S 2;  Surgeon: [name], MD;  Location: [facility];  Service: General;  Laterality: N/A;   LAPAROSCOPIC VAGINAL HYSTERECTOMY WITH SALPINGO OOPHORECTOMY Bilateral 01/05/2017   Procedure: LAPAROSCOPIC ASSISTED VAGINAL HYSTERECTOMY WITH SALPINGO OOPHORECTOMY;  Surgeon: Paula Compton, MD;  Location: [facility];  Service: Gynecology;  Laterality: Bilateral;   LIVER SURGERY     SIMPLE MASTECTOMY W/ SENTINEL NODE BIOPSY Bilateral 12/13/2009   TUBAL LIGATION      Family History  Problem Relation Age of Onset   Breast cancer Mother    Colon polyps Mother    Diabetes Mother    CAD Father    Diabetes Maternal Grandmother    Colon polyps Maternal Grandfather    Celiac disease Son    Colon cancer Neg Hx    Stomach cancer Neg Hx    Esophageal cancer Neg Hx    Pancreatic cancer Neg Hx     Social History   Socioeconomic History   Marital status: Married    Spouse name: Not on file   Number of children: 7   Years of education: Not on file   Highest  education level: Not on file  Occupational History   Not on file  Tobacco Use   Smoking status: Former    Packs/day: 1.00    Years: 5.00    Total pack years: 5.00    Types: Cigarettes    Quit date: 09/12/1975    Years since quitting: 47.0   Smokeless tobacco: Never  Vaping Use   Vaping Use: Never used  Substance and Sexual Activity   Alcohol use: No   Drug use: No   Sexual activity: Not on file  Other Topics Concern   Not on file  Social History Narrative   7 biological children and one step son   Social Determinants of Health   Financial Resource Strain: Low Risk  (11/15/2018)   Overall Financial Resource Strain (CARDIA)    Difficulty of Paying Living Expenses: Not very  hard  Food Insecurity: No Food Insecurity (11/15/2018)   Hunger Vital Sign    Worried About Running Out of Food in the Last Year: Never true    Ran Out of Food in the Last Year: Never true  Transportation Needs: No Transportation Needs (11/15/2018)   PRAPARE - Hydrologist (Medical): No    Lack of Transportation (Non-Medical): No  Physical Activity: Inactive (11/15/2018)   Exercise Vital Sign    Days of Exercise per Week: 0 days    Minutes of Exercise per Session: 0 min  Stress: Stress Concern Present (11/15/2018)   [place]    Feeling of Stress : To some extent  Social Connections: Moderately Integrated (11/15/2018)   Social Connection and Isolation Panel [NHANES]    Frequency of Communication with Friends and Family: More than three times a week    Frequency of Social Gatherings with Friends and Family: Once a week    Attends Religious Services: More than 4 times per year    Active Member of [employer] or Organizations: No    Attends Archivist Meetings: Never    Marital Status: Married  Human resources officer Violence: Not At Risk (11/15/2018)   Humiliation, Afraid, Rape, and Kick questionnaire    Fear of  Current or Ex-Partner: No    Emotionally Abused: No    Physically Abused: No    Sexually Abused: No      Review of systems: All other review of systems negative except as mentioned in the HPI.   Physical Exam: Vitals:   09/14/22 1004  BP: 130/80  Pulse: 66   Body mass index is 28.34 kg/m. Gen:      No acute distress HEENT:  sclera anicteric Abd:      soft, non-tender; no palpable masses, no distension Ext:    No edema Neuro: alert and oriented x 3 Psych: normal mood and affect  Data Reviewed:  Reviewed labs, radiology imaging, old records and pertinent past GI work up   Assessment and Plan/Recommendations:  76 year old very pleasant female with complaints of breakthrough GERD symptoms, erosive esophagitis and abdominal cramping secondary to irritable bowel syndrome, alternating diarrhea and constipation with intermittent fecal incontinence   GERD: Currently on pantoprazole, Pepcid and sucralfate with persistent breakthrough heartburn  Switch to Dexilant 60 mg daily Discussed lifestyle modifications and antireflux measures  Patient is interested in undergoing antireflux procedure TIF, will send referral to Dr. Bryan Lemma to discuss benefits and risks associated with TIF Hill Grade classification 2 based on endoscopic images Esophageal manometry to exclude any major esophageal motility disorder   IBS with diarrhea and constipation Continue with lactose-free diet, Benefiber, increase dietary fiber and water intake   Fecal incontinence: Continue Benefiber 1 tablespoon 3 times daily with meals Refer to pelvic floor physical therapy to improve anal sphincter tone   Return in 3 months or sooner if needed  This visit required 50 minutes of patient care (this includes precharting, chart review, review of results, face-to-face time used for counseling as well as treatment plan and follow-up. The patient was provided an opportunity to ask questions and all were answered. The  patient agreed with the plan and demonstrated an understanding of the instructions.  Damaris Hippo , MD    CC: Ginger Organ., MD

## 2022-09-14 NOTE — Patient Instructions (Addendum)
If you are age 76 or older, your body mass index should be between 23-30. Your Body mass index is 28.34 kg/m. If this is out of the aforementioned range listed, please consider follow up with your Primary Care Provider.  If you are age 52 or younger, your body mass index should be between 19-25. Your Body mass index is 28.34 kg/m. If this is out of the aformentioned range listed, please consider follow up with your Primary Care Provider.   ________________________________________________________   Stop Protonix, Pepcid and Carafate.  We have sent the following medications to your pharmacy for you to pick up at your convenience: Dexilant 60 mg: Take once daily  We will refer you to Pelvic Floor Physical Therapy.  You have been scheduled for an appointment at this location with Dr. Bryan Lemma on Friday, 12-15 at 8:40 am to discuss the TIF procedure.  If you need to reschedule this appointment please call as soon as possible:  [phone].  ___________________________________________________________________  Kim Chavez have been scheduled for an esophageal manometry at [facility] on February 7th at 8:30 am. Please arrive 30 minutes prior to your procedure for registration. You will need to go to outpatient registration (1st floor of the hospital) first. Make certain to bring your insurance cards as well as a complete list of medications.  Please remember the following:  1) Do not take any muscle relaxants, xanax (alprazolam) or ativan for 1 day prior to your test as well as the day of the test.  2) Nothing to eat or drink for 8 hours before your test.  3) Hold all diabetic medications/insulin the morning of the test. You may eat and take your medications after the test.  It will take at least 2 weeks to receive the results of this test from your physician. ------------------------------------------ ABOUT ESOPHAGEAL MANOMETRY Esophageal manometry (muh-NOM-uh-tree) is a test that  gauges how well your esophagus works. Your esophagus is the long, muscular tube that connects your throat to your stomach. Esophageal manometry measures the rhythmic muscle contractions (peristalsis) that occur in your esophagus when you swallow. Esophageal manometry also measures the coordination and force exerted by the muscles of your esophagus.  During esophageal manometry, a thin, flexible tube (catheter) that contains sensors is passed through your nose, down your esophagus and into your stomach. Esophageal manometry can be helpful in diagnosing some mostly uncommon disorders that affect your esophagus.  Why it's done Esophageal manometry is used to evaluate the movement (motility) of food through the esophagus and into the stomach. The test measures how well the circular bands of muscle (sphincters) at the top and bottom of your esophagus open and close, as well as the pressure, strength and pattern of the wave of esophageal muscle contractions that moves food along.  What you can expect Esophageal manometry is an outpatient procedure done without sedation. Most people tolerate it well. You may be asked to change into a hospital gown before the test starts.  During esophageal manometry  While you are sitting up, a member of your health care team sprays your throat with a numbing medication or puts numbing gel in your nose or both.  A catheter is guided through your nose into your esophagus. The catheter may be sheathed in a water-filled sleeve. It doesn't interfere with your breathing. However, your eyes may water, and you may gag. You may have a slight nosebleed from irritation.  After the catheter is in place, you may be asked to lie on your back  on an exam table, or you may be asked to remain seated.  You then swallow small sips of water. As you do, a computer connected to the catheter records the pressure, strength and pattern of your esophageal muscle contractions.  During the test, you'll be  asked to breathe slowly and smoothly, remain as still as possible, and swallow only when you're asked to do so.  A member of your health care team may move the catheter down into your stomach while the catheter continues its measurements.  The catheter then is slowly withdrawn. The test usually lasts 20 to 30 minutes.  After esophageal manometry  When your esophageal manometry is complete, you may return to your normal activities  This test typically takes 30-45 minutes to complete. ___________________________________________________________________  Please purchase the following medications over the counter and take as directed: Benefiber: Take 1 tablespoon twice a day  Please follow up in 3 to 4 months.  I appreciate the opportunity to care for you. Thank you for choosing me and [facility],  Dr. Harl Bowie

## 2022-09-16 ENCOUNTER — Telehealth: Payer: Self-pay | Admitting: Gastroenterology

## 2022-09-16 NOTE — Telephone Encounter (Signed)
Inbound call from patient regarding medication " Dexlandsoprazole" states is too expensive for her.Please advise

## 2022-09-21 MED ORDER — SUCRALFATE 1 G PO TABS: 1.0000 g | ORAL_TABLET | Freq: Two times a day (BID) | ORAL | 3 refills | Status: DC | PRN

## 2022-09-21 MED ORDER — PANTOPRAZOLE SODIUM 40 MG PO TBEC: 40.0000 mg | DELAYED_RELEASE_TABLET | Freq: Two times a day (BID) | ORAL | 5 refills | Status: DC

## 2022-09-21 MED ORDER — FAMOTIDINE 20 MG PO TABS: 20.0000 mg | ORAL_TABLET | Freq: Every evening | ORAL | 5 refills | Status: DC | PRN

## 2022-09-21 NOTE — Telephone Encounter (Signed)
Called patient and relayed recommendations. She expressed understanding.  She has the famotidine already (added to med list). Sucralfate and pantoprazole scripts sent to pharmacy.

## 2022-09-21 NOTE — Telephone Encounter (Signed)
Given she has high out-of-pocket expense Dexilant, please advise patient to to pantoprazole 40 mg, will increase dose to twice daily before lunch and dinner.  Use Pepcid 20 mg at bedtime as needed and Carafate 1 g up to twice daily as needed for breakthrough symptoms.  She has follow-up scheduled with Dr. Bryan Lemma to discuss possible TIF.  Thank you

## 2022-10-05 DIAGNOSIS — G4733 Obstructive sleep apnea (adult) (pediatric): Secondary | ICD-10-CM | POA: Diagnosis not present

## 2022-10-05 DIAGNOSIS — R051 Acute cough: Secondary | ICD-10-CM | POA: Diagnosis not present

## 2022-10-05 DIAGNOSIS — J309 Allergic rhinitis, unspecified: Secondary | ICD-10-CM | POA: Diagnosis not present

## 2022-10-05 DIAGNOSIS — J0100 Acute maxillary sinusitis, unspecified: Secondary | ICD-10-CM | POA: Diagnosis not present

## 2022-10-12 ENCOUNTER — Ambulatory Visit: Payer: Medicare Other

## 2022-10-13 DIAGNOSIS — R7989 Other specified abnormal findings of blood chemistry: Secondary | ICD-10-CM | POA: Diagnosis not present

## 2022-10-13 DIAGNOSIS — K589 Irritable bowel syndrome without diarrhea: Secondary | ICD-10-CM | POA: Diagnosis not present

## 2022-10-13 DIAGNOSIS — E039 Hypothyroidism, unspecified: Secondary | ICD-10-CM | POA: Diagnosis not present

## 2022-10-13 DIAGNOSIS — R197 Diarrhea, unspecified: Secondary | ICD-10-CM | POA: Diagnosis not present

## 2022-10-13 DIAGNOSIS — E785 Hyperlipidemia, unspecified: Secondary | ICD-10-CM | POA: Diagnosis not present

## 2022-10-13 DIAGNOSIS — R140 Abdominal distension (gaseous): Secondary | ICD-10-CM | POA: Diagnosis not present

## 2022-10-13 DIAGNOSIS — I10 Essential (primary) hypertension: Secondary | ICD-10-CM | POA: Diagnosis not present

## 2022-10-13 DIAGNOSIS — R7301 Impaired fasting glucose: Secondary | ICD-10-CM | POA: Diagnosis not present

## 2022-10-13 DIAGNOSIS — R5383 Other fatigue: Secondary | ICD-10-CM | POA: Diagnosis not present

## 2022-10-13 DIAGNOSIS — K219 Gastro-esophageal reflux disease without esophagitis: Secondary | ICD-10-CM | POA: Diagnosis not present

## 2022-10-20 DIAGNOSIS — R82998 Other abnormal findings in urine: Secondary | ICD-10-CM | POA: Diagnosis not present

## 2022-10-20 DIAGNOSIS — I129 Hypertensive chronic kidney disease with stage 1 through stage 4 chronic kidney disease, or unspecified chronic kidney disease: Secondary | ICD-10-CM | POA: Diagnosis not present

## 2022-10-20 DIAGNOSIS — M62838 Other muscle spasm: Secondary | ICD-10-CM | POA: Diagnosis not present

## 2022-10-20 DIAGNOSIS — E785 Hyperlipidemia, unspecified: Secondary | ICD-10-CM | POA: Diagnosis not present

## 2022-10-20 DIAGNOSIS — R7301 Impaired fasting glucose: Secondary | ICD-10-CM | POA: Diagnosis not present

## 2022-10-20 DIAGNOSIS — Z1331 Encounter for screening for depression: Secondary | ICD-10-CM | POA: Diagnosis not present

## 2022-10-20 DIAGNOSIS — N1831 Chronic kidney disease, stage 3a: Secondary | ICD-10-CM | POA: Diagnosis not present

## 2022-10-20 DIAGNOSIS — G4700 Insomnia, unspecified: Secondary | ICD-10-CM | POA: Diagnosis not present

## 2022-10-20 DIAGNOSIS — N281 Cyst of kidney, acquired: Secondary | ICD-10-CM | POA: Diagnosis not present

## 2022-10-20 DIAGNOSIS — M8580 Other specified disorders of bone density and structure, unspecified site: Secondary | ICD-10-CM | POA: Diagnosis not present

## 2022-10-20 DIAGNOSIS — I700 Atherosclerosis of aorta: Secondary | ICD-10-CM | POA: Diagnosis not present

## 2022-10-20 DIAGNOSIS — Z23 Encounter for immunization: Secondary | ICD-10-CM | POA: Diagnosis not present

## 2022-10-20 DIAGNOSIS — K219 Gastro-esophageal reflux disease without esophagitis: Secondary | ICD-10-CM | POA: Diagnosis not present

## 2022-10-20 DIAGNOSIS — Z0000 Encounter for general adult medical examination without abnormal findings: Secondary | ICD-10-CM | POA: Diagnosis not present

## 2022-10-20 DIAGNOSIS — M797 Fibromyalgia: Secondary | ICD-10-CM | POA: Diagnosis not present

## 2022-10-20 DIAGNOSIS — J329 Chronic sinusitis, unspecified: Secondary | ICD-10-CM | POA: Diagnosis not present

## 2022-10-20 DIAGNOSIS — Z1339 Encounter for screening examination for other mental health and behavioral disorders: Secondary | ICD-10-CM | POA: Diagnosis not present

## 2022-10-20 DIAGNOSIS — I10 Essential (primary) hypertension: Secondary | ICD-10-CM | POA: Diagnosis not present

## 2022-10-21 DIAGNOSIS — N281 Cyst of kidney, acquired: Secondary | ICD-10-CM | POA: Diagnosis not present

## 2022-10-21 DIAGNOSIS — N200 Calculus of kidney: Secondary | ICD-10-CM | POA: Diagnosis not present

## 2022-10-21 DIAGNOSIS — R3915 Urgency of urination: Secondary | ICD-10-CM | POA: Diagnosis not present

## 2022-10-28 ENCOUNTER — Ambulatory Visit: Payer: Medicare Other

## 2022-11-01 NOTE — Progress Notes (Deleted)
@Patient  ID: Kim Chavez, female    DOB: 11-01-1946, 76 y.o.   MRN: [id number]  No chief complaint on file.   Referring provider: Ginger Organ., MD  HPI: 76 year old female, former light smoker.  History significant for OSA, chronic bronchitis, hypertension, GERD, hypothyroidism.  Patient of Dr. Elsworth Soho, last seen on 11/02/2021.   Previous [facility] pulmonary encounter: 11/02/21- Dr. Elsworth Soho  76 yo remote smoker for FU of mild OSA PMH - chronic migraine headaches on propranolol.  2016  surgery for large liver cysts   Meds - Albuterol and Atrovent nebs caused palpitations.  Singulair caused depressed mood    On her last office visit 12/2019, we reviewed PFTs which were normal and discontinued Singulair and Pulmicort which caused her hoarseness.  She has done well with her breathing since then. She had recurrence of her chronic cough about a month ago and use Chlor-Trimeton tabs with significant relief.   She reports good usage of her CPAP and denies problems with mask or pressure.  She has water in the hose sometimes and wonders what she can do with the humidity setting.    11/02/2022- Interim hx  Patient presents today for annual follow-up for mild OSA. During her last office visit with Dr. Elsworth Soho it was recommended that she lower humidification setting on CPAP.               Allergies  Allergen Reactions   Aspirin Other (See Comments)    Almost caused ulcers Almost caused ulcers   Erythromycin Nausea And Vomiting   Ibuprofen Other (See Comments)    Has Crohn's and was instructed to NOT take Has Crohn's and was instructed to NOT take   Tape Rash    Plastic    Codeine Nausea And Vomiting    'Deathly sick on stomach"    Other Diarrhea    Cramping also- Nothing with seeds/has Crohn's!!   Penicillins Swelling    Arms became swollen Has patient had a PCN reaction causing immediate rash, facial/tongue/throat swelling, SOB or lightheadedness with hypotension:  Yes Has patient had a PCN reaction causing severe rash involving mucus membranes or skin necrosis: No Has patient had a PCN reaction that required hospitalization: No Has patient had a PCN reaction occurring within the last 10 years: No If all of the above answers are "NO", then may proceed with Cephalosporin use.    Neosporin [Neomycin-Bacitracin Zn-Polymyx] Itching, Rash and Other (See Comments)    Turns red around the site and itches like crazy for about two weeks.     Immunization History  Administered Date(s) Administered   Influenza Split 08/18/2017   Influenza, High Dose Seasonal PF 09/27/2019   PFIZER(Purple Top)SARS-COV-2 Vaccination 01/19/2020, 02/09/2020   Td 11/12/1993    Past Medical History:  Diagnosis Date   Anxiety    Breast cancer ([place]) 2011   BILATERAL BREAST REMOVED   Depression    Fibromyalgia    GERD (gastroesophageal reflux disease)    Hypertension    Hypothyroidism    Migraine    MIGRAINES   Overactive bladder    Pneumonia    several times   RUQ pain    Sleep apnea    uses c-pap machine   Thyroid disease     Tobacco History: Social History   Tobacco Use  Smoking Status Former   Packs/day: 1.00   Years: 5.00   Total pack years: 5.00   Types: Cigarettes   Quit date: 09/12/1975   Years since  quitting: 47.1  Smokeless Tobacco Never   Counseling given: Not Answered   Outpatient Medications Prior to Visit  Medication Sig Dispense Refill   azelastine (ASTELIN) 0.1 % nasal spray Place 2 sprays into both nostrils 2 (two) times daily. Use in each nostril as directed     cetirizine (ZYRTEC) 10 MG tablet Take 1 tablet by mouth daily as needed.     chlorpheniramine (CHLOR-TRIMETON) 4 MG tablet Take 4 mg by mouth at bedtime.     Cholecalciferol (VITAMIN D3) 50 MCG (2000 UT) capsule Take 2,000 Units by mouth daily.      Coenzyme Q10 (CO Q-10) 100 MG CAPS Take 200 mg by mouth every morning.      famotidine (PEPCID) 20 MG tablet Take 1 tablet (20 mg  total) by mouth at bedtime as needed for heartburn or indigestion. 30 tablet 5   HYDROcodone-acetaminophen (NORCO/VICODIN) 5-325 MG tablet Take 1 tablet by mouth every 6 (six) hours as needed for moderate pain or severe pain.   0   hydrocortisone 2.5 % cream hydrocortisone 2.5 % topical cream  APPLY TO OUTER EARS 1 TO 2 TIMES PER DAY AS NEEDED FOR EAR ITCHING     Lactobacillus-Inulin (CULTURELLE DIGESTIVE DAILY PO) Take by mouth.     levothyroxine (SYNTHROID, LEVOTHROID) 50 MCG tablet Take 50 mcg by mouth daily before breakfast.     metoprolol succinate (TOPROL-XL) 50 MG 24 hr tablet Take 50 mg by mouth 2 (two) times daily.     NON FORMULARY CPAP: At bedtime (nightly)     NON FORMULARY Estradiol cream .31m     pantoprazole (PROTONIX) 40 MG tablet Take 1 tablet (40 mg total) by mouth 2 (two) times daily before lunch and supper. 60 tablet 5   Polyethyl Glycol-Propyl Glycol (SYSTANE OP) Use for dry eyes as needed     simvastatin (ZOCOR) 40 MG tablet Take 1 tablet by mouth at bedtime.     sucralfate (CARAFATE) 1 g tablet Take 1 tablet (1 g total) by mouth 2 (two) times daily as needed. 60 tablet 3   Wheat Dextrin (BENEFIBER) POWD Use 1 tablespoon twice a day  0   zolpidem (AMBIEN) 10 MG tablet Take 10 mg by mouth at bedtime.     No facility-administered medications prior to visit.      Review of Systems  Review of Systems   Physical Exam  There were no vitals taken for this visit. Physical Exam   Lab Results:  CBC    Component Value Date/Time   WBC 8.6 11/21/2019 1408   RBC 5.19 (H) 11/21/2019 1408   HGB 14.6 11/21/2019 1408   HCT 42.6 11/21/2019 1408   PLT 307.0 11/21/2019 1408   MCV 82.1 11/21/2019 1408   MCH 26.2 11/16/2018 0756   MCHC 34.2 11/21/2019 1408   RDW 14.9 11/21/2019 1408   LYMPHSABS 1.4 11/21/2019 1408   MONOABS 0.6 11/21/2019 1408   EOSABS 0.2 11/21/2019 1408   BASOSABS 0.1 11/21/2019 1408    BMET    Component Value Date/Time   NA 141 11/16/2018 0756    K 3.6 11/16/2018 0756   CL 107 11/16/2018 0756   CO2 25 11/16/2018 0756   GLUCOSE 113 (H) 11/16/2018 0756   BUN 12 11/16/2018 0756   CREATININE 1.03 (H) 11/16/2018 0756   CALCIUM 8.9 11/16/2018 0756   GFRNONAA 54 (L) 11/16/2018 0756   GFRAA >60 11/16/2018 0756    BNP    Component Value Date/Time   BNP 57.9 05/01/2015  0123    ProBNP No results found for: "PROBNP"  Imaging: No results found.   Assessment & Plan:   No problem-specific Assessment & Plan notes found for this encounter.     Martyn Ehrich, NP 11/01/2022

## 2022-11-02 ENCOUNTER — Ambulatory Visit: Payer: Medicare Other | Admitting: Primary Care

## 2022-11-11 ENCOUNTER — Encounter (HOSPITAL_BASED_OUTPATIENT_CLINIC_OR_DEPARTMENT_OTHER): Payer: Self-pay | Admitting: Pulmonary Disease

## 2022-11-11 ENCOUNTER — Ambulatory Visit (INDEPENDENT_AMBULATORY_CARE_PROVIDER_SITE_OTHER): Payer: Medicare Other | Admitting: Pulmonary Disease

## 2022-11-11 VITALS — BP 110/68 | HR 71 | Temp 98.0°F | Ht 63.0 in | Wt 163.8 lb

## 2022-11-11 DIAGNOSIS — G4733 Obstructive sleep apnea (adult) (pediatric): Secondary | ICD-10-CM | POA: Diagnosis not present

## 2022-11-11 NOTE — Progress Notes (Signed)
   Subjective:    Patient ID: Kim Chavez, female    DOB: 05-01-1946, 76 y.o.   MRN: [id number]  HPI  76  yo remote smoker for FU of mild OSA PMH - chronic migraine headaches on propranolol.  2016  surgery for large liver cysts CKD-3   Meds - Albuterol and Atrovent nebs caused palpitations.  Singulair caused depressed mood   Chief Complaint  Patient presents with   Follow-up    Pt states she was just diagnosed with stage 3 kidney disease.    Annual follow-up of OSA.  She is settled down with a fullface mask.  She reports compliance and CPAP is only helped improve her daytime somnolence and fatigue.  No problems with mask or pressure. She had a sinus infection and did not use her machine for a few nights but has now gotten back on it over the last 3 weeks.  She reports being rested in the daytime and denies sleep pressure. No dryness   Significant tests/ events reviewed  12/2018 PFTs >> normal PSG 02/2017 showed AHI of 5/hour with total sleep time of 5 hours, she slept supine for 1 hour with AHI of 25/hour.  Lowest desaturation was 76%, she spent 195 minutes with saturation less than 88%.     PSG in 08/2011 >> AHI of 22/hour, supine AHI was 61/hour.  CPAP was titrated to 7 cm   HST 11/2017 >>moderate OSA with 21 events per hour.    CT chest/abdomen/pelvis 05/2019 liver cysts, not impressed with bibasal scarring CT scan from 04/2015 and chest x-ray from 07/2015  showed elevated right hemidiaphragm. Sniff test 10/2017 normal movement of BL diaphragms   10/2017 esophagram - normal  Review of Systems neg for any significant sore throat, dysphagia, itching, sneezing, nasal congestion or excess/ purulent secretions, fever, chills, sweats, unintended wt loss, pleuritic or exertional cp, hempoptysis, orthopnea pnd or change in chronic leg swelling. Also denies presyncope, palpitations, heartburn, abdominal pain, nausea, vomiting, diarrhea or change in bowel or urinary habits,  dysuria,hematuria, rash, arthralgias, visual complaints, headache, numbness weakness or ataxia.     Objective:   Physical Exam  Gen. Pleasant, obese, in no distress ENT - no lesions, no post nasal drip Neck: No JVD, no thyromegaly, no carotid bruits Lungs: no use of accessory muscles, no dullness to percussion, decreased without rales or rhonchi  Cardiovascular: Rhythm regular, heart sounds  normal, no murmurs or gallops, no peripheral edema Musculoskeletal: No deformities, no cyanosis or clubbing , no tremors        Assessment & Plan:    Intermittent cough related to postnasal drip -improves with Chlor-Trimeton.  She uses this during spring and fall

## 2022-11-11 NOTE — Assessment & Plan Note (Signed)
CPAP download was reviewed -shows residual AHI of 6/hour on auto settings 5 to 10 cm with maximum pressure of 10 cm.  Compliance is improved and CPAP is only helped improve her daytime somnolence and fatigue.  There is minimal leak. We will change auto CPAP settings to 5 to 12 cm. I discussed various types of fullface mask and she may benefit from an AirFit F30 which she is willing to trial  Weight loss encouraged, compliance with goal of at least 4-6 hrs every night is the expectation. Advised against medications with sedative side effects Cautioned against driving when sleepy - understanding that sleepiness will vary on a day to day basis

## 2022-11-11 NOTE — Patient Instructions (Signed)
X Change autoCPAP to 5-12 cm  Trial of airfit F30 full face mask

## 2022-11-17 ENCOUNTER — Ambulatory Visit (INDEPENDENT_AMBULATORY_CARE_PROVIDER_SITE_OTHER): Payer: Medicare Other | Admitting: Gastroenterology

## 2022-11-17 ENCOUNTER — Encounter: Payer: Self-pay | Admitting: Gastroenterology

## 2022-11-17 VITALS — BP 122/80 | HR 71 | Ht 63.0 in | Wt 163.0 lb

## 2022-11-17 DIAGNOSIS — R0989 Other specified symptoms and signs involving the circulatory and respiratory systems: Secondary | ICD-10-CM | POA: Diagnosis not present

## 2022-11-17 DIAGNOSIS — K2100 Gastro-esophageal reflux disease with esophagitis, without bleeding: Secondary | ICD-10-CM | POA: Diagnosis not present

## 2022-11-17 DIAGNOSIS — N289 Disorder of kidney and ureter, unspecified: Secondary | ICD-10-CM | POA: Diagnosis not present

## 2022-11-17 DIAGNOSIS — R12 Heartburn: Secondary | ICD-10-CM

## 2022-11-17 DIAGNOSIS — R1110 Vomiting, unspecified: Secondary | ICD-10-CM

## 2022-11-17 NOTE — Progress Notes (Unsigned)
Chief Complaint:    GERD with erosive esophagitis, discuss antireflux surgical options   HPI:     Patient is a 76 y.o. female with history of CKD 3, anxiety, depression, OSA (on CPAP), HTN, hypothyroidism, migraines, GERD with erosive esophagitis, referred to me by Dr. Silverio Decamp for evaluation of possible antireflux intervention with Transoral Incisionless Fundoplication (TIF) with a goal to stop or significantly reduce acid suppression therapy.  Long-standing hx of GERD and has been on multiple medications over the years as outlined below. Most recently recommended changed from Protonix to Dexilant 60 mg/day in 09/2022, but insurance would not cover Dexilant, so resumed Protonix 40 mg BID along with continued Pepcid and sucralfate.  Reports being recently diagnosed with CKD3, and now very concerned about PPI effect, so stopped taking the Protonix.  Will request lab results today.   GERD history: -Index symptoms: Heartburn, regurgitation, increased throat clearing.  No dysphagia -Exacerbating features: Spicy, tomato-based, soda. Supine. -Medications trialed: Pantoprazole, Pepcid, sucralfate, Nexium, Prilosec, Prevacid -Current medications: None due to recent CKD3 dx -Complications: EE  GERD evaluation: -Last EGD: 12/2021: LA Grade C esophagitis, Hill grade 2 valve -Barium esophagram: 12/2016: Normal motility -Esophageal Manometry: Scheduled for 01/19/2023 -pH/Impedance: -Bravo: None  Endoscopic History: EGD 01/08/22 - LA Grade C reflux esophagitis with no bleeding. Biopsied.  Showed reflux related inflammation, negative for dysplasia or Barrett's esophagus. - Gastritis. Biopsied.  Negative for H. pylori bacterial infection, dysplasia or metaplasia. - Erythematous duodenopathy. Biopsied.   Colonoscopy 01/08/22 - Normal mucosa in the entire examined colon. Biopsied.  Negative for microscopic colitis - Mild diverticulosis in the sigmoid colon and in the descending colon. - Non-bleeding  internal hemorrhoids.   GERD-HRQL Questionnaire Score: 28/50 (on acid suppression therapy) and marked "dissatisfied" with current health condition regarding reflux    Review of systems:     No chest pain, no SOB, no fevers, no urinary sx   Past Medical History:  Diagnosis Date   Anxiety    Breast cancer ([place]) 2011   BILATERAL BREAST REMOVED   Depression    Fibromyalgia    GERD (gastroesophageal reflux disease)    Hypertension    Hypothyroidism    Migraine    MIGRAINES   Overactive bladder    Pneumonia    several times   RUQ pain    Sleep apnea    uses c-pap machine   Thyroid disease     Patient's surgical history, family medical history, social history, medications and allergies were all reviewed in Epic    Current Outpatient Medications  Medication Sig Dispense Refill   azelastine (ASTELIN) 0.1 % nasal spray Place 2 sprays into both nostrils 2 (two) times daily. Use in each nostril as directed     cetirizine (ZYRTEC) 10 MG tablet Take 1 tablet by mouth daily as needed.     chlorpheniramine (CHLOR-TRIMETON) 4 MG tablet Take 4 mg by mouth at bedtime.     Cholecalciferol (VITAMIN D3) 50 MCG (2000 UT) capsule Take 2,000 Units by mouth daily.      famotidine (PEPCID) 20 MG tablet Take 1 tablet (20 mg total) by mouth at bedtime as needed for heartburn or indigestion. 30 tablet 5   HYDROcodone-acetaminophen (NORCO/VICODIN) 5-325 MG tablet Take 1 tablet by mouth every 6 (six) hours as needed for moderate pain or severe pain.   0   hydrocortisone 2.5 % cream hydrocortisone 2.5 % topical cream  APPLY TO OUTER EARS 1 TO 2 TIMES PER DAY AS NEEDED FOR EAR ITCHING  Lactobacillus-Inulin (CULTURELLE DIGESTIVE DAILY PO) Take by mouth.     levothyroxine (SYNTHROID, LEVOTHROID) 50 MCG tablet Take 50 mcg by mouth daily before breakfast.     metoprolol succinate (TOPROL-XL) 50 MG 24 hr tablet Take 50 mg by mouth 2 (two) times daily.     NON FORMULARY CPAP: At bedtime (nightly)     NON  FORMULARY Estradiol cream .60m     Polyethyl Glycol-Propyl Glycol (SYSTANE OP) Use for dry eyes as needed     simvastatin (ZOCOR) 40 MG tablet Take 1 tablet by mouth at bedtime.     Wheat Dextrin (BENEFIBER) POWD Use 1 tablespoon twice a day  0   zolpidem (AMBIEN) 10 MG tablet Take 10 mg by mouth at bedtime.     Coenzyme Q10 (CO Q-10) 100 MG CAPS Take 200 mg by mouth every morning.  (Patient not taking: Reported on 11/17/2022)     pantoprazole (PROTONIX) 40 MG tablet Take 1 tablet (40 mg total) by mouth 2 (two) times daily before lunch and supper. (Patient not taking: Reported on 11/17/2022) 60 tablet 5   sucralfate (CARAFATE) 1 g tablet Take 1 tablet (1 g total) by mouth 2 (two) times daily as needed. (Patient not taking: Reported on 11/17/2022) 60 tablet 3   No current facility-administered medications for this visit.    Physical Exam:     BP 122/80   Pulse 71   Ht 5' 3"  (1.6 m)   Wt 163 lb (73.9 kg)   BMI 28.87 kg/m   GENERAL:  Pleasant *** in NAD PSYCH: : Cooperative, normal affect EENT:  conjunctiva pink, mucous membranes moist, neck supple without masses CARDIAC:  RRR, ***murmur heard, no peripheral edema PULM: Normal respiratory effort, lungs CTA bilaterally, no wheezing ABDOMEN:  Nondistended, soft, nontender. No obvious masses, no hepatomegaly,  normal bowel sounds SKIN:  turgor, no lesions seen Musculoskeletal:  Normal muscle tone, normal strength NEURO: Alert and oriented x 3, no focal neurologic deficits   IMPRESSION and PLAN:    1)   - PCN and Erythromycin allergy - Codeine allergy (nausea/vomiting)  - BMP end of January - Tums prn - Pepcid 20 mg qhs - Holding PPI - LMickel BaasTIF mid/end Feb *** - EM Feb           Stanislaw Acton V Gladyse Corvin ,DO, FSouthwest Minnesota Surgical Center Inc12/05/2022, 11:40 AM

## 2022-11-17 NOTE — Patient Instructions (Addendum)
Your provider has requested that you go to the basement level for lab work before the end of January. Press "B" on the elevator. The lab is located at the first door on the left as you exit the elevator.   Take Tums as needed.  Take Pepcid 20 mg daily at bedtime.  We will contact you to schedule your TIF procedure in mid or end of February.  If you are age 77 or older, your body mass index should be between 23-30. Your Body mass index is 28.87 kg/m. If this is out of the aforementioned range listed, please consider follow up with your Primary Care Provider.  __________________________________________________________  The [facility] providers would like to encourage you to use [facility] to communicate with providers for non-urgent requests or questions.  Due to long hold times on the telephone, sending your provider a message by [facility] may be a faster and more efficient way to get a response.  Please allow 48 business hours for a response.  Please remember that this is for non-urgent requests.   Due to recent changes in healthcare laws, you may see the results of your imaging and laboratory studies on MyChart before your provider has had a chance to review them.  We understand that in some cases there may be results that are confusing or concerning to you. Not all laboratory results come back in the same time frame and the provider may be waiting for multiple results in order to interpret others.  Please give [place] 48 hours in order for your provider to thoroughly review all the results before contacting the office for clarification of your results.     Thank you for choosing me and [facility].  Vito Cirigliano, D.O.

## 2022-11-19 ENCOUNTER — Telehealth: Payer: Self-pay

## 2022-11-19 DIAGNOSIS — N289 Disorder of kidney and ureter, unspecified: Secondary | ICD-10-CM

## 2022-11-19 NOTE — Telephone Encounter (Signed)
Patient made aware. Informed Dr. Vivia Ewing recommendations to the patient. Advised her push more fluids and continue follow up with her PCP. Advised patient to repeat her lab work in [facility] January in our lab downstairs at the basement. Lab Orders are already placed. Patient verbalized understanding.

## 2022-11-19 NOTE — Telephone Encounter (Signed)
-----   Message from [place], DO sent at 11/18/2022  4:44 PM EST ----- Please let this patient know that I received her labs and had a chance to review them.  The creatinine is only mildly elevated at 1.2, and with concomitant elevation in BUN.  Electrolytes are otherwise normal.  While this could be a marker of early kidney disease, there is also a possibility of acute kidney injury which can reverse quickly with appropriate hydration, avoidance of inciting medications, etc.  She continue to follow-up with her PCM for this and repeat labs in January like we discussed.  Wrap bilirubin was also elevated 2.0.  The remainder of the liver enzymes were normal.  I see where she notated that her bilirubin was 1.6 last year and 1.5 in 2020.  I have a higher suspicion for benign Gilberts syndrome, and recommend hepatic panel to include total bilirubin, direct bilirubin, and indirect bilirubin when we repeat her labs in January.  There is otherwise no urgency to repeating that now.

## 2022-11-23 ENCOUNTER — Telehealth: Payer: Self-pay

## 2022-11-23 ENCOUNTER — Other Ambulatory Visit: Payer: Self-pay

## 2022-11-23 DIAGNOSIS — R12 Heartburn: Secondary | ICD-10-CM

## 2022-11-23 DIAGNOSIS — K2100 Gastro-esophageal reflux disease with esophagitis, without bleeding: Secondary | ICD-10-CM

## 2022-11-23 DIAGNOSIS — R1110 Vomiting, unspecified: Secondary | ICD-10-CM

## 2022-11-23 NOTE — Telephone Encounter (Signed)
-----   Message from [place], DO sent at 11/18/2022  4:48 PM EST ----- Can you please look to start coordinating to schedule this patient for a TIF in mid/late February or beyond.  She is scheduled for Esophageal Manometry in early February, and would need those results first before proceeding with TIF.  This will be straight TIF in [facility] unit  Thank you

## 2022-11-23 NOTE — Telephone Encounter (Signed)
Ambulatory referral to GI placed.

## 2022-11-26 ENCOUNTER — Ambulatory Visit: Payer: Medicare Other | Admitting: Gastroenterology

## 2022-11-30 ENCOUNTER — Ambulatory Visit: Payer: Medicare Other | Attending: Gastroenterology

## 2022-11-30 ENCOUNTER — Other Ambulatory Visit: Payer: Self-pay

## 2022-11-30 DIAGNOSIS — R293 Abnormal posture: Secondary | ICD-10-CM | POA: Diagnosis not present

## 2022-11-30 DIAGNOSIS — R279 Unspecified lack of coordination: Secondary | ICD-10-CM | POA: Diagnosis not present

## 2022-11-30 DIAGNOSIS — M6281 Muscle weakness (generalized): Secondary | ICD-10-CM | POA: Insufficient documentation

## 2022-11-30 DIAGNOSIS — N3941 Urge incontinence: Secondary | ICD-10-CM | POA: Insufficient documentation

## 2022-11-30 DIAGNOSIS — N393 Stress incontinence (female) (male): Secondary | ICD-10-CM | POA: Diagnosis not present
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
## 2022-12-23 ENCOUNTER — Ambulatory Visit: Payer: Medicare Other

## 2022-12-29 ENCOUNTER — Ambulatory Visit: Payer: Medicare Other | Attending: Gastroenterology

## 2022-12-29 DIAGNOSIS — N393 Stress incontinence (female) (male): Secondary | ICD-10-CM | POA: Diagnosis not present

## 2022-12-29 DIAGNOSIS — N3941 Urge incontinence: Secondary | ICD-10-CM | POA: Insufficient documentation

## 2022-12-29 DIAGNOSIS — R293 Abnormal posture: Secondary | ICD-10-CM

## 2022-12-29 DIAGNOSIS — M6281 Muscle weakness (generalized): Secondary | ICD-10-CM | POA: Diagnosis not present

## 2022-12-29 DIAGNOSIS — R279 Unspecified lack of coordination: Secondary | ICD-10-CM | POA: Insufficient documentation
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
## 2022-12-29 NOTE — Patient Instructions (Signed)
Urge Incontinence  Ideal urination frequency is every 2-4 wakeful hours, which equates to 5-8 times within a 24-hour period.   Urge incontinence is leakage that occurs when the bladder muscle contracts, creating a sudden need to go before getting to the bathroom.   Going too often when your bladder isn't actually full can disrupt the body's automatic signals to store and hold urine longer, which will increase urgency/frequency.  In this case, the bladder "is running the show" and strategies can be learned to retrain this pattern.   One should be able to control the first urge to urinate, at around 150mL.  The bladder can hold up to a "grande latte," or 400mL. To help you gain control, practice the Urge Drill below when urgency strikes.  This drill will help retrain your bladder signals and allow you to store and hold urine longer.  The overall goal is to stretch out your time between voids to reach a more manageable voiding schedule.    Practice your "quick flicks" often throughout the day (each waking hour) even when you don't need feel the urge to go.  This will help strengthen your pelvic floor muscles, making them more effective in controlling leakage.  Urge Drill  When you feel an urge to go, follow these steps to regain control: Stop what you are doing and be still Take one deep breath, directing your air into your abdomen Think an affirming thought, such as "I've got this." Do 5 quick flicks of your pelvic floor Walk with control to the bathroom to void, or delay voiding     The knack: Use this technique while coughing, laughing, sneezing, or with any activities that causes you to leak urine a little. Right before you perform one of these activities that increase pressure in the abdomen and pushes a little urine out, perform a pelvic floor muscle contraction and hold. If that does not completely stop the leaking, try tightening your thighs together in addition to performing a  pelvic floor muscle contraction. Make sure you are not trying to stifle a cough, sneeze, or laugh; allow these activities in full as it will cause less pressure down into the bladder and pelvic floor muscles.   [facility] [address], [address] [place], [place] [address] Phone # [phone] Fax [phone]  

## 2023-01-03 ENCOUNTER — Ambulatory Visit: Payer: Medicare Other

## 2023-01-03 DIAGNOSIS — N3941 Urge incontinence: Secondary | ICD-10-CM | POA: Diagnosis not present

## 2023-01-03 DIAGNOSIS — N393 Stress incontinence (female) (male): Secondary | ICD-10-CM | POA: Diagnosis not present

## 2023-01-03 DIAGNOSIS — R279 Unspecified lack of coordination: Secondary | ICD-10-CM | POA: Diagnosis not present

## 2023-01-03 DIAGNOSIS — M6281 Muscle weakness (generalized): Secondary | ICD-10-CM | POA: Diagnosis not present

## 2023-01-03 DIAGNOSIS — R293 Abnormal posture: Secondary | ICD-10-CM | POA: Diagnosis not present
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
## 2023-01-07 ENCOUNTER — Telehealth: Payer: Self-pay | Admitting: Gastroenterology

## 2023-01-07 ENCOUNTER — Other Ambulatory Visit (INDEPENDENT_AMBULATORY_CARE_PROVIDER_SITE_OTHER): Payer: Medicare Other

## 2023-01-07 DIAGNOSIS — K2100 Gastro-esophageal reflux disease with esophagitis, without bleeding: Secondary | ICD-10-CM | POA: Diagnosis not present

## 2023-01-07 DIAGNOSIS — N289 Disorder of kidney and ureter, unspecified: Secondary | ICD-10-CM | POA: Diagnosis not present

## 2023-01-07 LAB — BASIC METABOLIC PANEL
BUN: 11 mg/dL (ref 6–23)
CO2: 31 mEq/L (ref 19–32)
Calcium: 9.1 mg/dL (ref 8.4–10.5)
Chloride: 103 mEq/L (ref 96–112)
Creatinine, Ser: 0.9 mg/dL (ref 0.40–1.20)
GFR: 61.91 mL/min (ref >60.00)
Glucose, Bld: 95 mg/dL (ref 70–99)
Potassium: 4 mEq/L (ref 3.5–5.1)
Sodium: 140 mEq/L (ref 135–145)

## 2023-01-07 LAB — HEPATIC FUNCTION PANEL
ALT: 15 U/L (ref 0–35)
AST: 17 U/L (ref 0–37)
Albumin: 4.3 g/dL (ref 3.5–5.2)
Alkaline Phosphatase: 79 U/L (ref 39–117)
Bilirubin, Direct: 0.2 mg/dL (ref 0.0–0.3)
Total Bilirubin: 1.5 mg/dL — ABNORMAL HIGH (ref 0.2–1.2)
Total Protein: 6.8 g/dL (ref 6.0–8.3)

## 2023-01-07 NOTE — Telephone Encounter (Signed)
Left message for pt to call back. Lab work faxed to PCP and copy of labs mailed to pt.

## 2023-01-07 NOTE — Telephone Encounter (Signed)
Inbound call from patient stating that she came in for labs today and is requesting when we get the results back to send the results to her PCP Dr. Marton Redwood. Patient is also seeking advice if she can get a hard copy of the results as well. Please advise.

## 2023-01-10 ENCOUNTER — Ambulatory Visit: Payer: Medicare Other

## 2023-01-10 DIAGNOSIS — R293 Abnormal posture: Secondary | ICD-10-CM

## 2023-01-10 DIAGNOSIS — R279 Unspecified lack of coordination: Secondary | ICD-10-CM

## 2023-01-10 DIAGNOSIS — M6281 Muscle weakness (generalized): Secondary | ICD-10-CM | POA: Diagnosis not present

## 2023-01-10 DIAGNOSIS — N393 Stress incontinence (female) (male): Secondary | ICD-10-CM | POA: Diagnosis not present

## 2023-01-10 DIAGNOSIS — N3941 Urge incontinence: Secondary | ICD-10-CM
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
## 2023-01-10 NOTE — Telephone Encounter (Signed)
Spoke with pt and let pt know that labs were faxed to pcp and mailed to her home address. Pt verbalized understanding.

## 2023-01-17 ENCOUNTER — Ambulatory Visit: Payer: Medicare Other | Attending: Gastroenterology

## 2023-01-17 DIAGNOSIS — R293 Abnormal posture: Secondary | ICD-10-CM | POA: Insufficient documentation

## 2023-01-17 DIAGNOSIS — N393 Stress incontinence (female) (male): Secondary | ICD-10-CM | POA: Diagnosis not present

## 2023-01-17 DIAGNOSIS — N3941 Urge incontinence: Secondary | ICD-10-CM | POA: Insufficient documentation

## 2023-01-17 DIAGNOSIS — R279 Unspecified lack of coordination: Secondary | ICD-10-CM | POA: Insufficient documentation

## 2023-01-17 DIAGNOSIS — M6281 Muscle weakness (generalized): Secondary | ICD-10-CM | POA: Diagnosis not present
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
## 2023-01-19 ENCOUNTER — Encounter (HOSPITAL_COMMUNITY)
Admission: RE | Disposition: A | Discharge status: Home / Self Care | Payer: Self-pay | Source: Ambulatory Visit | Attending: Gastroenterology

## 2023-01-19 ENCOUNTER — Ambulatory Visit (HOSPITAL_COMMUNITY)
Admission: RE | Admit: 2023-01-19 | Discharge: 2023-01-19 | Disposition: A | Discharge status: Home / Self Care | Payer: Medicare Other | Source: Ambulatory Visit | Attending: Gastroenterology | Admitting: Gastroenterology

## 2023-01-19 DIAGNOSIS — K219 Gastro-esophageal reflux disease without esophagitis: Secondary | ICD-10-CM | POA: Diagnosis not present

## 2023-01-19 HISTORY — PX: ESOPHAGEAL MANOMETRY: SHX5429

## 2023-01-19 SURGERY — MANOMETRY, ESOPHAGUS

## 2023-01-19 MED ORDER — LIDOCAINE VISCOUS HCL 2 % MT SOLN
OROMUCOSAL | Status: AC
  Filled 2023-01-19: qty 15

## 2023-01-19 SURGICAL SUPPLY — 2 items
FACESHIELD LNG OPTICON STERILE (SAFETY) IMPLANT
GLOVE BIO SURGEON STRL SZ8 (GLOVE) ×4 IMPLANT

## 2023-01-19 NOTE — Progress Notes (Signed)
Esophageal Manometry done per protocol. Patient tolerated well without distress or complication.  

## 2023-01-22 ENCOUNTER — Encounter (HOSPITAL_COMMUNITY): Payer: Self-pay | Admitting: Gastroenterology

## 2023-01-24 ENCOUNTER — Ambulatory Visit: Payer: Medicare Other

## 2023-01-25 ENCOUNTER — Ambulatory Visit: Payer: Medicare Other

## 2023-01-25 DIAGNOSIS — R279 Unspecified lack of coordination: Secondary | ICD-10-CM

## 2023-01-25 DIAGNOSIS — R293 Abnormal posture: Secondary | ICD-10-CM

## 2023-01-25 DIAGNOSIS — N3941 Urge incontinence: Secondary | ICD-10-CM

## 2023-01-25 DIAGNOSIS — N393 Stress incontinence (female) (male): Secondary | ICD-10-CM

## 2023-01-25 DIAGNOSIS — M6281 Muscle weakness (generalized): Secondary | ICD-10-CM | POA: Diagnosis not present

## 2023-01-25 NOTE — Patient Instructions (Signed)
Squatty potty: When your knees are level or below the level of your hips, pelvic floor muscles are pressed against rectum, preventing ease of bowel movement. By getting knees above the level of the hips, these pelvic floor muscles relax, allowing easier passage of bowel movement. Ways to get knees above hips: o Squatty Potty (7inch and 9inch versions) o Small stool o Roll of toilet paper under each foot o Hardback book or stack of magazines under each foot  Relaxed Toileting mechanics: Once in this position, make sure to lean forward with forearms on thighs, wide knees, relaxed stomach, and breathe.   Balloon Breathing: when you get ready to push to have a bowel movement, act like you're trying to blow up a balloon   Bowel massage: To assist with more regular and more comfortable bowel movements, try performing bowel massage nightly for 5-10 minutes. Place hands in the lower right side of your abdomen to start; in small circles, massage up, across, and down the left side of your abdomen. Pressure does not need to be hard, but just comfortable. You can use lotion or oil to make more comfortable.     [facility] [address], [place], [place] [address] Phone # [phone] Fax [phone]
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
## 2023-01-26 DIAGNOSIS — I10 Essential (primary) hypertension: Secondary | ICD-10-CM | POA: Diagnosis not present

## 2023-01-26 DIAGNOSIS — D0502 Lobular carcinoma in situ of left breast: Secondary | ICD-10-CM | POA: Diagnosis not present

## 2023-01-26 DIAGNOSIS — K760 Fatty (change of) liver, not elsewhere classified: Secondary | ICD-10-CM | POA: Diagnosis not present

## 2023-01-27 ENCOUNTER — Ambulatory Visit: Payer: Medicare Other

## 2023-01-28 NOTE — Telephone Encounter (Signed)
Is it okay to schedule pt for March 13th or should I wait for EM results before scheduling?

## 2023-02-08 NOTE — Progress Notes (Incomplete)
Kim Chavez    UA:[id number]    August 30, 1946  Primary Care Physician:Shaw, Emily Filbert., MD  Referring Physician: Ginger Organ., MD [address] [place],  [place] [address]   Chief complaint:  Fecal incontinence, GERD  HPI:  77 year old very pleasant female here for follow-up visit for GERD with complaints of fecal incontinence I last saw her on 09/14/2022. At that time she continued to have irregular bowel habits with alternating constipation and diarrhea. She reported having a BM every 2-3 days. She complained of heartburn but are slightly better when she took pantoprazole, Pepcid and sucralfate. She also complained of  intermittent cramping after meals which improved with a BM. She had intermittent fecal incontinence with loose stool and fecal urgency.   Today...   GI Hx:    EGD 01/08/22 - LA Grade C reflux esophagitis with no bleeding. Biopsied.  Showed reflux related inflammation, negative for dysplasia or Barrett's esophagus. - Gastritis. Biopsied.  Negative for H. pylori bacterial infection, dysplasia or metaplasia. - Erythematous duodenopathy. Biopsied.   Colonoscopy 01/08/22 - Normal mucosa in the entire examined colon. Biopsied.  Negative for microscopic colitis - Mild diverticulosis in the sigmoid colon and in the descending colon. - Non-bleeding internal hemorrhoids.    Current Outpatient Medications:    azelastine (ASTELIN) 0.1 % nasal spray, Place 2 sprays into both nostrils 2 (two) times daily. Use in each nostril as directed, Disp: , Rfl:    cetirizine (ZYRTEC) 10 MG tablet, Take 1 tablet by mouth daily as needed., Disp: , Rfl:    chlorpheniramine (CHLOR-TRIMETON) 4 MG tablet, Take 4 mg by mouth at bedtime., Disp: , Rfl:    Cholecalciferol (VITAMIN D3) 50 MCG (2000 UT) capsule, Take 2,000 Units by mouth daily. , Disp: , Rfl:    Coenzyme Q10 (CO Q-10) 100 MG CAPS, Take 200 mg by mouth every morning.  (Patient not taking: Reported on  11/17/2022), Disp: , Rfl:    famotidine (PEPCID) 20 MG tablet, Take 1 tablet (20 mg total) by mouth at bedtime as needed for heartburn or indigestion., Disp: 30 tablet, Rfl: 5   HYDROcodone-acetaminophen (NORCO/VICODIN) 5-325 MG tablet, Take 1 tablet by mouth every 6 (six) hours as needed for moderate pain or severe pain. , Disp: , Rfl: 0   hydrocortisone 2.5 % cream, hydrocortisone 2.5 % topical cream  APPLY TO OUTER EARS 1 TO 2 TIMES PER DAY AS NEEDED FOR EAR ITCHING, Disp: , Rfl:    Lactobacillus-Inulin (CULTURELLE DIGESTIVE DAILY PO), Take by mouth., Disp: , Rfl:    levothyroxine (SYNTHROID, LEVOTHROID) 50 MCG tablet, Take 50 mcg by mouth daily before breakfast., Disp: , Rfl:    metoprolol succinate (TOPROL-XL) 50 MG 24 hr tablet, Take 50 mg by mouth 2 (two) times daily., Disp: , Rfl:    NON FORMULARY, CPAP: At bedtime (nightly), Disp: , Rfl:    NON FORMULARY, Estradiol cream .'2mg'$ , Disp: , Rfl:    pantoprazole (PROTONIX) 40 MG tablet, Take 1 tablet (40 mg total) by mouth 2 (two) times daily before lunch and supper. (Patient not taking: Reported on 11/17/2022), Disp: 60 tablet, Rfl: 5   Polyethyl Glycol-Propyl Glycol (SYSTANE OP), Use for dry eyes as needed, Disp: , Rfl:    simvastatin (ZOCOR) 40 MG tablet, Take 1 tablet by mouth at bedtime., Disp: , Rfl:    sucralfate (CARAFATE) 1 g tablet, Take 1 tablet (1 g total) by mouth 2 (two) times daily as  needed. (Patient not taking: Reported on 11/17/2022), Disp: 60 tablet, Rfl: 3   Wheat Dextrin (BENEFIBER) POWD, Use 1 tablespoon twice a day, Disp: , Rfl: 0   zolpidem (AMBIEN) 10 MG tablet, Take 10 mg by mouth at bedtime., Disp: , Rfl:     Allergies as of 02/09/2023 - Review Complete 01/25/2023  Allergen Reaction Noted   Aspirin Other (See Comments) 12/02/2017   Erythromycin Nausea And Vomiting 05/01/2015   Ibuprofen Other (See Comments) 09/12/2015   Tape Rash 05/01/2015   Codeine Nausea And Vomiting 03/09/2014   Other Diarrhea 12/02/2017    Penicillins Swelling 03/09/2014   Neosporin [neomycin-bacitracin zn-polymyx] Itching, Rash, and Other (See Comments) 03/09/2014    Past Medical History:  Diagnosis Date   Anxiety    Breast cancer ([place]) 2011   BILATERAL BREAST REMOVED   Depression    Fibromyalgia    GERD (gastroesophageal reflux disease)    Hypertension    Hypothyroidism    Migraine    MIGRAINES   Overactive bladder    Pneumonia    several times   RUQ pain    Sleep apnea    uses c-pap machine   Thyroid disease     Past Surgical History:  Procedure Laterality Date   ANTERIOR AND POSTERIOR REPAIR N/A 01/05/2017   Procedure: ANTERIOR (CYSTOCELE) AND POSTERIOR REPAIR (RECTOCELE);  Surgeon: Paula Compton, MD;  Location: [facility];  Service: Gynecology;  Laterality: N/A;   APPENDECTOMY     CHOLECYSTECTOMY N/A 09/16/2015   Procedure: CHOLECYSTECTOMY;  Surgeon: [name], MD;  Location: [facility];  Service: General;  Laterality: N/A;   deviated septum surgery in 1980's     ESOPHAGEAL MANOMETRY N/A 01/19/2023   Procedure: ESOPHAGEAL MANOMETRY (EM);  Surgeon: Mauri Pole, MD;  Location: [facility];  Service: Gastroenterology;  Laterality: N/A;   LAPAROSCOPIC LIVER CYST UNROOFING N/A 09/16/2015   Procedure: LAPAROSCOPIC LIVER CYST UNROOFING X'S 2;  Surgeon: [name], MD;  Location: [facility];  Service: General;  Laterality: N/A;   LAPAROSCOPIC VAGINAL HYSTERECTOMY WITH SALPINGO OOPHORECTOMY Bilateral 01/05/2017   Procedure: LAPAROSCOPIC ASSISTED VAGINAL HYSTERECTOMY WITH SALPINGO OOPHORECTOMY;  Surgeon: Paula Compton, MD;  Location: [facility];  Service: Gynecology;  Laterality: Bilateral;   LIVER SURGERY     SIMPLE MASTECTOMY W/ SENTINEL NODE BIOPSY Bilateral 12/13/2009   TUBAL LIGATION      Family History  Problem Relation Age of Onset   Breast cancer Mother    Colon polyps Mother    Diabetes Mother    CAD Father    Diabetes Maternal Grandmother    Colon polyps Maternal Grandfather    Celiac disease  Son    Colon cancer Neg Hx    Stomach cancer Neg Hx    Esophageal cancer Neg Hx    Pancreatic cancer Neg Hx     Social History   Socioeconomic History   Marital status: Married    Spouse name: Not on file   Number of children: 7   Years of education: Not on file   Highest education level: Not on file  Occupational History   Not on file  Tobacco Use   Smoking status: Former    Packs/day: 1.00    Years: 5.00    Total pack years: 5.00    Types: Cigarettes    Quit date: 09/12/1975    Years since quitting: 47.4   Smokeless tobacco: Never  Vaping Use   Vaping Use: Never used  Substance and Sexual Activity   Alcohol use:  No   Drug use: No   Sexual activity: Not on file  Other Topics Concern   Not on file  Social History Narrative   7 biological children and one step son   Social Determinants of Health   Financial Resource Strain: Low Risk  (11/15/2018)   Overall Financial Resource Strain (CARDIA)    Difficulty of Paying Living Expenses: Not very hard  Food Insecurity: No Food Insecurity (11/15/2018)   Hunger Vital Sign    Worried About Running Out of Food in the Last Year: Never true    Ran Out of Food in the Last Year: Never true  Transportation Needs: No Transportation Needs (11/15/2018)   PRAPARE - Hydrologist (Medical): No    Lack of Transportation (Non-Medical): No  Physical Activity: Inactive (11/15/2018)   Exercise Vital Sign    Days of Exercise per Week: 0 days    Minutes of Exercise per Session: 0 min  Stress: Stress Concern Present (11/15/2018)   [place]    Feeling of Stress : To some extent  Social Connections: Moderately Integrated (11/15/2018)   Social Connection and Isolation Panel [NHANES]    Frequency of Communication with Friends and Family: More than three times a week    Frequency of Social Gatherings with Friends and Family: Once a week    Attends  Religious Services: More than 4 times per year    Active Member of [employer] or Organizations: No    Attends Archivist Meetings: Never    Marital Status: Married  Human resources officer Violence: Not At Risk (11/15/2018)   Humiliation, Afraid, Rape, and Kick questionnaire    Fear of Current or Ex-Partner: No    Emotionally Abused: No    Physically Abused: No    Sexually Abused: No      Review of systems:  Review of Systems  Constitutional:  Negative for appetite change and fever.  HENT:  Negative for trouble swallowing.   Respiratory:  Negative for cough and shortness of breath.   Cardiovascular:  Negative for chest pain.  Gastrointestinal:  Negative for abdominal distention, abdominal pain, anal bleeding, blood in stool, constipation, diarrhea, nausea, rectal pain and vomiting.  Genitourinary:  Negative for dysuria.  Musculoskeletal:  Negative for back pain.  Skin:  Negative for rash.  Neurological:  Negative for weakness.  All other systems reviewed and are negative.    Physical Exam: General: well-appearing ***  Eyes: sclera anicteric, no redness ENT: oral mucosa moist without lesions, no cervical or supraclavicular lymphadenopathy CV: RRR, no JVD, no peripheral edema Resp: clear to auscultation bilaterally, normal RR and effort noted GI: soft, no tenderness, with active bowel sounds. No guarding or palpable organomegaly noted. Skin; warm and dry, no rash or jaundice noted Neuro: awake, alert and oriented x 3. Normal gross motor function and fluent speech   Data Reviewed:  Reviewed labs, radiology imaging, old records and pertinent past GI work up   Assessment and Plan/Recommendations:  77 year old very pleasant female with complaints of breakthrough GERD symptoms, erosive esophagitis and abdominal cramping secondary to irritable bowel syndrome, alternating diarrhea and constipation with intermittent fecal incontinence   GERD: Currently on pantoprazole, Pepcid and  sucralfate with persistent breakthrough heartburn  Switch to Dexilant 60 mg daily Discussed lifestyle modifications and antireflux measures  Patient is interested in undergoing antireflux procedure TIF, will send referral to Dr. Bryan Lemma to discuss benefits and risks associated with [facility]  Grade classification 2 based on endoscopic images Esophageal manometry to exclude any major esophageal motility disorder   IBS with diarrhea and constipation Continue with lactose-free diet, Benefiber, increase dietary fiber and water intake   Fecal incontinence: Continue Benefiber 1 tablespoon 3 times daily with meals Refer to pelvic floor physical therapy to improve anal sphincter tone   Return in 3 months or sooner if needed  This visit required 50 minutes of patient care (this includes precharting, chart review, review of results, face-to-face time used for counseling as well as treatment plan and follow-up. The patient was provided an opportunity to ask questions and all were answered. The patient agreed with the plan and demonstrated an understanding of the instructions.  Damaris Hippo , MD    CC: Ginger Organ., MD   Andres Shad as a scribe for Harl Bowie, MD.,have documented all relevant documentation on the behalf of Harl Bowie, MD,as directed by  Harl Bowie, MD while in the presence of Harl Bowie, MD.   I, Harl Bowie, MD, have reviewed all documentation for this visit. The documentation on 02/08/23 for the exam, diagnosis, procedures, and orders are all accurate and complete.

## 2023-02-09 ENCOUNTER — Encounter: Payer: Self-pay | Admitting: Gastroenterology

## 2023-02-09 ENCOUNTER — Telehealth: Payer: Self-pay

## 2023-02-09 ENCOUNTER — Ambulatory Visit (INDEPENDENT_AMBULATORY_CARE_PROVIDER_SITE_OTHER): Payer: Medicare Other | Admitting: Gastroenterology

## 2023-02-09 VITALS — BP 138/78 | HR 68 | Ht 63.0 in | Wt 165.0 lb

## 2023-02-09 DIAGNOSIS — Z85828 Personal history of other malignant neoplasm of skin: Secondary | ICD-10-CM | POA: Diagnosis not present

## 2023-02-09 DIAGNOSIS — D2239 Melanocytic nevi of other parts of face: Secondary | ICD-10-CM | POA: Diagnosis not present

## 2023-02-09 DIAGNOSIS — K2100 Gastro-esophageal reflux disease with esophagitis, without bleeding: Secondary | ICD-10-CM

## 2023-02-09 DIAGNOSIS — K582 Mixed irritable bowel syndrome: Secondary | ICD-10-CM | POA: Diagnosis not present

## 2023-02-09 DIAGNOSIS — D2262 Melanocytic nevi of left upper limb, including shoulder: Secondary | ICD-10-CM | POA: Diagnosis not present

## 2023-02-09 DIAGNOSIS — D225 Melanocytic nevi of trunk: Secondary | ICD-10-CM | POA: Diagnosis not present

## 2023-02-09 DIAGNOSIS — L814 Other melanin hyperpigmentation: Secondary | ICD-10-CM | POA: Diagnosis not present

## 2023-02-09 DIAGNOSIS — L821 Other seborrheic keratosis: Secondary | ICD-10-CM | POA: Diagnosis not present

## 2023-02-09 DIAGNOSIS — D1801 Hemangioma of skin and subcutaneous tissue: Secondary | ICD-10-CM | POA: Diagnosis not present

## 2023-02-09 MED ORDER — FAMOTIDINE 20 MG PO TABS: 20.0000 mg | ORAL_TABLET | Freq: Every evening | ORAL | 3 refills | Status: DC | PRN

## 2023-02-09 MED ORDER — PANTOPRAZOLE SODIUM 40 MG PO TBEC: 40.0000 mg | DELAYED_RELEASE_TABLET | Freq: Every day | ORAL | 3 refills | Status: DC

## 2023-02-09 NOTE — Telephone Encounter (Signed)
-----   Message -----  From: Lavena Bullion, DO  Sent: 02/09/2023   1:04 PM EST  To: Oda Kilts, CMA; Greggory Keen, LPN; *   Sure thing! Will get her in.   Cisco Kindt, can you please start coordinating for TIF for this patient at [facility]? TIF only (no concomitant lap hiatal hernia repair).   Thanks!   VC   ----- Message -----  From: Mauri Pole, MD  Sent: 02/09/2023  12:39 PM EST  To: Oda Kilts, CMA; Greggory Keen, LPN; *   Hi Vito,   This patient wants to proceed with TIF, can you help her ?   Thanks  VN

## 2023-02-09 NOTE — Patient Instructions (Addendum)
We will refer you to Dr Cathleen Corti for consult for TIF procedure : Appt 03/30/23 at 8:40 am with Dr Cathleen Corti.    We have sent the following medications to your pharmacy for you to pick up at your convenience: Pantoprazole and Famotidine   Start Pantoprazole 40 mg once daily.   Continue Famotidine at bedtime.   Follow up  in 6 months, sooner if needed.   _______________________________________________________  If your blood pressure at your visit was 140/90 or greater, please contact your primary care physician to follow up on this.  _______________________________________________________  If you are age 3 or older, your body mass index should be between 23-30. Your Body mass index is 29.23 kg/m. If this is out of the aforementioned range listed, please consider follow up with your Primary Care Provider.  If you are age 51 or younger, your body mass index should be between 19-25. Your Body mass index is 29.23 kg/m. If this is out of the aformentioned range listed, please consider follow up with your Primary Care Provider.   ________________________________________________________  The [facility] providers would like to encourage you to use [facility] to communicate with providers for non-urgent requests or questions.  Due to long hold times on the telephone, sending your provider a message by [facility] may be a faster and more efficient way to get a response.  Please allow 48 business hours for a response.  Please remember that this is for non-urgent requests.  _______________________________________________________  Thank you for choosing me and [facility].  Dr. Silverio Decamp

## 2023-02-15 NOTE — Telephone Encounter (Signed)
Ambulatory referral to GI in epic to obtain auth for TIF. Possible date for TIF ONLY is 04/20/23

## 2023-02-23 NOTE — Telephone Encounter (Signed)
Patient called requesting to speak with nurse regarding treatment and procedures.

## 2023-02-25 NOTE — Telephone Encounter (Signed)
Called and spoke with patient. Pt states that she already discussed TIF with Dr. Bryan Lemma in Dec 2023 and did not have any additional concerns. Pt would like to proceed directly with scheduling TIF. I informed patient that I would cancel her 4/17 OV. Pt understands that we have already started working on [place] for TIF and the expected date of the procedure will be 04/20/23 if her insurance approves it. Pt knows that I will be in touch with more specific information once we have approval from her insurance. Pt verbalized understanding and had no concerns at the end of the call.

## 2023-03-15 NOTE — Telephone Encounter (Signed)
Per Joey in pre-cert:"pt has medicare and aarp, those don't require auths for TIF".

## 2023-03-17 ENCOUNTER — Other Ambulatory Visit: Payer: Self-pay

## 2023-03-17 DIAGNOSIS — K2100 Gastro-esophageal reflux disease with esophagitis, without bleeding: Secondary | ICD-10-CM

## 2023-03-17 NOTE — Telephone Encounter (Signed)
Patient has been scheduled for TIF at [facility] on 04/20/23 at 8:30 am. Patient is aware that she will need to arrive with a care partner by 7 am. Patient is aware that she may have to stay overnight. Pt knows that her instructions, post-op diet, and f/u appt information will be sent via MyChart today but I will also mail everything to her. Pt's 2-week post TIF f/u is scheduled for Thursday, 05/05/23 at 3:40 pm. Patient verbalized understanding of all information and had no concerns at the end of the call.

## 2023-03-22 ENCOUNTER — Telehealth (HOSPITAL_BASED_OUTPATIENT_CLINIC_OR_DEPARTMENT_OTHER): Payer: Self-pay | Admitting: Pulmonary Disease

## 2023-03-22 DIAGNOSIS — G4733 Obstructive sleep apnea (adult) (pediatric): Secondary | ICD-10-CM

## 2023-03-22 NOTE — Telephone Encounter (Signed)
To: Henderson Newcomer; Kathyrn Sheriff; Raven Michaele Offer  Subject: New pap order request                          Hello,   Patient has called in and requested we request a new cpap order be sent for new pap unit. It looks like we have reviewed and patient should be elig. for new unit if Dr. Vassie Loll will send a new order.   Please advise   Thank you,   Luellen Pucker    Okay to send in prescription for new auto CPAP 5 to 12 cm

## 2023-03-22 NOTE — Telephone Encounter (Signed)
Order has been placed for patient to get a new CPAP.   Nothing further needed.

## 2023-03-22 NOTE — Telephone Encounter (Signed)
Pt is requesting order for new cpap machine

## 2023-03-30 ENCOUNTER — Ambulatory Visit: Payer: Medicare Other | Admitting: Gastroenterology

## 2023-04-13 ENCOUNTER — Encounter (HOSPITAL_COMMUNITY): Payer: Self-pay | Admitting: Gastroenterology

## 2023-04-13 NOTE — Progress Notes (Signed)
Attempted to obtain medical history via telephone, unable to reach at this time. HIPAA compliant voicemail message left requesting return call to pre surgical testing department. 

## 2023-04-18 DIAGNOSIS — H02834 Dermatochalasis of left upper eyelid: Secondary | ICD-10-CM | POA: Diagnosis not present

## 2023-04-18 DIAGNOSIS — H52203 Unspecified astigmatism, bilateral: Secondary | ICD-10-CM | POA: Diagnosis not present

## 2023-04-18 DIAGNOSIS — D3131 Benign neoplasm of right choroid: Secondary | ICD-10-CM | POA: Diagnosis not present

## 2023-04-18 DIAGNOSIS — H524 Presbyopia: Secondary | ICD-10-CM | POA: Diagnosis not present

## 2023-04-18 DIAGNOSIS — H02831 Dermatochalasis of right upper eyelid: Secondary | ICD-10-CM | POA: Diagnosis not present

## 2023-04-19 NOTE — Anesthesia Preprocedure Evaluation (Signed)
Anesthesia Evaluation  Patient identified by MRN, date of birth, ID band Patient awake    Reviewed: Allergy & Precautions, NPO status , Patient's Chart, lab work & pertinent test results, reviewed documented beta blocker date and time   History of Anesthesia Complications Negative for: history of anesthetic complications  Airway Mallampati: II  TM Distance: >3 FB Neck ROM: Full    Dental no notable dental hx. (+) Dental Advisory Given   Pulmonary sleep apnea , former smoker   Pulmonary exam normal        Cardiovascular hypertension, Pt. on home beta blockers Normal cardiovascular exam     Neuro/Psych  PSYCHIATRIC DISORDERS Anxiety Depression    negative neurological ROS     GI/Hepatic Neg liver ROS,GERD  Medicated,,Crohn's   Endo/Other  Hypothyroidism    Renal/GU negative Renal ROS     Musculoskeletal negative musculoskeletal ROS (+)    Abdominal   Peds  Hematology negative hematology ROS (+)   Anesthesia Other Findings   Reproductive/Obstetrics                             Anesthesia Physical Anesthesia Plan  ASA: 3  Anesthesia Plan: General   Post-op Pain Management: Minimal or no pain anticipated   Induction: Intravenous, Rapid sequence and Cricoid pressure planned  PONV Risk Score and Plan: 3 and Ondansetron, Dexamethasone and Treatment may vary due to age or medical condition  Airway Management Planned: Oral ETT  Additional Equipment:   Intra-op Plan:   Post-operative Plan: Extubation in OR  Informed Consent: I have reviewed the patients History and Physical, chart, labs and discussed the procedure including the risks, benefits and alternatives for the proposed anesthesia with the patient or authorized representative who has indicated his/her understanding and acceptance.     Dental advisory given  Plan Discussed with: Anesthesiologist and CRNA  Anesthesia Plan  Comments:        Anesthesia Quick Evaluation

## 2023-04-20 ENCOUNTER — Encounter (HOSPITAL_COMMUNITY): Payer: Self-pay | Admitting: Gastroenterology

## 2023-04-20 ENCOUNTER — Observation Stay (HOSPITAL_COMMUNITY)
Admission: RE | Admit: 2023-04-20 | Discharge: 2023-04-21 | Disposition: A | Discharge status: Home / Self Care | Payer: Medicare Other | Attending: Family Medicine | Admitting: Family Medicine

## 2023-04-20 ENCOUNTER — Encounter (HOSPITAL_COMMUNITY)
Admission: RE | Disposition: A | Discharge status: Home / Self Care | Payer: Self-pay | Source: Home / Self Care | Attending: Internal Medicine

## 2023-04-20 ENCOUNTER — Ambulatory Visit (HOSPITAL_BASED_OUTPATIENT_CLINIC_OR_DEPARTMENT_OTHER): Payer: Medicare Other | Admitting: Anesthesiology

## 2023-04-20 ENCOUNTER — Other Ambulatory Visit: Payer: Self-pay

## 2023-04-20 ENCOUNTER — Ambulatory Visit (HOSPITAL_COMMUNITY): Payer: Medicare Other | Admitting: Anesthesiology

## 2023-04-20 DIAGNOSIS — R12 Heartburn: Secondary | ICD-10-CM | POA: Insufficient documentation

## 2023-04-20 DIAGNOSIS — Z853 Personal history of malignant neoplasm of breast: Secondary | ICD-10-CM | POA: Diagnosis not present

## 2023-04-20 DIAGNOSIS — Z79899 Other long term (current) drug therapy: Secondary | ICD-10-CM | POA: Insufficient documentation

## 2023-04-20 DIAGNOSIS — E039 Hypothyroidism, unspecified: Secondary | ICD-10-CM | POA: Insufficient documentation

## 2023-04-20 DIAGNOSIS — Z87891 Personal history of nicotine dependence: Secondary | ICD-10-CM

## 2023-04-20 DIAGNOSIS — I10 Essential (primary) hypertension: Secondary | ICD-10-CM

## 2023-04-20 DIAGNOSIS — K2100 Gastro-esophageal reflux disease with esophagitis, without bleeding: Secondary | ICD-10-CM

## 2023-04-20 DIAGNOSIS — G4733 Obstructive sleep apnea (adult) (pediatric): Secondary | ICD-10-CM | POA: Diagnosis present

## 2023-04-20 DIAGNOSIS — K2970 Gastritis, unspecified, without bleeding: Secondary | ICD-10-CM | POA: Diagnosis not present

## 2023-04-20 DIAGNOSIS — K319 Disease of stomach and duodenum, unspecified: Secondary | ICD-10-CM | POA: Diagnosis not present

## 2023-04-20 DIAGNOSIS — E785 Hyperlipidemia, unspecified: Secondary | ICD-10-CM | POA: Diagnosis present

## 2023-04-20 HISTORY — PX: BIOPSY: SHX5522

## 2023-04-20 HISTORY — PX: ESOPHAGOGASTRODUODENOSCOPY (EGD) WITH PROPOFOL: SHX5813

## 2023-04-20 HISTORY — PX: SAVORY DILATION: SHX5439

## 2023-04-20 HISTORY — PX: TRANSORAL INCISIONLESS FUNDOPLICATION: SHX6840

## 2023-04-20 SURGERY — ESOPHAGOGASTRODUODENOSCOPY (EGD) WITH PROPOFOL
Anesthesia: General

## 2023-04-20 MED ORDER — PANTOPRAZOLE SODIUM 40 MG PO TBEC
40.0000 mg | DELAYED_RELEASE_TABLET | Freq: Two times a day (BID) | ORAL | Status: DC
  Administered 2023-04-20 – 2023-04-21 (×3): 40 mg via ORAL
  Filled 2023-04-20 (×3): qty 1

## 2023-04-20 MED ORDER — SODIUM CHLORIDE 0.9 % IV SOLN: INTRAVENOUS | Status: DC

## 2023-04-20 MED ORDER — PROMETHAZINE HCL 25 MG/ML IJ SOLN: 6.2500 mg | INTRAMUSCULAR | Status: DC | PRN

## 2023-04-20 MED ORDER — SUGAMMADEX SODIUM 200 MG/2ML IV SOLN
INTRAVENOUS | Status: DC | PRN
  Administered 2023-04-20: 150 mg via INTRAVENOUS

## 2023-04-20 MED ORDER — PROMETHAZINE HCL 25 MG/ML IJ SOLN
INTRAMUSCULAR | Status: AC
  Administered 2023-04-20: 12.5 mg via INTRAVENOUS
  Filled 2023-04-20: qty 1

## 2023-04-20 MED ORDER — FENTANYL CITRATE (PF) 100 MCG/2ML IJ SOLN
INTRAMUSCULAR | Status: DC | PRN
  Administered 2023-04-20: 50 ug via INTRAVENOUS

## 2023-04-20 MED ORDER — FENTANYL CITRATE (PF) 100 MCG/2ML IJ SOLN
INTRAMUSCULAR | Status: AC
  Filled 2023-04-20: qty 2

## 2023-04-20 MED ORDER — PROPOFOL 10 MG/ML IV BOLUS
INTRAVENOUS | Status: DC | PRN
  Administered 2023-04-20: 100 mg via INTRAVENOUS

## 2023-04-20 MED ORDER — FENTANYL CITRATE PF 50 MCG/ML IJ SOSY: 25.0000 ug | PREFILLED_SYRINGE | INTRAMUSCULAR | Status: DC

## 2023-04-20 MED ORDER — ONDANSETRON HCL 4 MG/2ML IJ SOLN
INTRAMUSCULAR | Status: DC | PRN
  Administered 2023-04-20: 4 mg via INTRAVENOUS

## 2023-04-20 MED ORDER — ONDANSETRON HCL 4 MG/2ML IJ SOLN
4.0000 mg | Freq: Once | INTRAMUSCULAR | Status: AC
  Administered 2023-04-20: 4 mg via INTRAVENOUS

## 2023-04-20 MED ORDER — FAMOTIDINE IN NACL 20-0.9 MG/50ML-% IV SOLN
20.0000 mg | Freq: Once | INTRAVENOUS | Status: AC
  Administered 2023-04-20: 20 mg via INTRAVENOUS
  Filled 2023-04-20: qty 50

## 2023-04-20 MED ORDER — FENTANYL CITRATE PF 50 MCG/ML IJ SOSY
25.0000 ug | PREFILLED_SYRINGE | INTRAMUSCULAR | Status: DC | PRN
  Administered 2023-04-20: 50 ug via INTRAVENOUS

## 2023-04-20 MED ORDER — CLINDAMYCIN PHOSPHATE 900 MG/50ML IV SOLN
900.0000 mg | Freq: Once | INTRAVENOUS | Status: AC
  Administered 2023-04-20: 900 mg via INTRAVENOUS
  Filled 2023-04-20: qty 50

## 2023-04-20 MED ORDER — ROCURONIUM BROMIDE 10 MG/ML (PF) SYRINGE
PREFILLED_SYRINGE | INTRAVENOUS | Status: DC | PRN
  Administered 2023-04-20: 40 mg via INTRAVENOUS

## 2023-04-20 MED ORDER — SUCCINYLCHOLINE CHLORIDE 200 MG/10ML IV SOSY
PREFILLED_SYRINGE | INTRAVENOUS | Status: DC | PRN
  Administered 2023-04-20: 100 mg via INTRAVENOUS

## 2023-04-20 MED ORDER — ONDANSETRON HCL 4 MG/2ML IJ SOLN
INTRAMUSCULAR | Status: AC
  Filled 2023-04-20: qty 2

## 2023-04-20 MED ORDER — SIMETHICONE 80 MG PO CHEW: 80.0000 mg | CHEWABLE_TABLET | Freq: Four times a day (QID) | ORAL | Status: DC | PRN

## 2023-04-20 MED ORDER — METOPROLOL SUCCINATE ER 50 MG PO TB24
50.0000 mg | ORAL_TABLET | Freq: Two times a day (BID) | ORAL | Status: DC
  Administered 2023-04-20 – 2023-04-21 (×2): 50 mg via ORAL
  Filled 2023-04-20 (×2): qty 1

## 2023-04-20 MED ORDER — ZOLPIDEM TARTRATE 5 MG PO TABS
5.0000 mg | ORAL_TABLET | Freq: Every day | ORAL | Status: DC
  Administered 2023-04-20: 5 mg via ORAL
  Filled 2023-04-20: qty 1

## 2023-04-20 MED ORDER — FENTANYL CITRATE (PF) 100 MCG/2ML IJ SOLN: 25.0000 ug | INTRAMUSCULAR | Status: DC | PRN

## 2023-04-20 MED ORDER — AMISULPRIDE (ANTIEMETIC) 5 MG/2ML IV SOLN: 10.0000 mg | Freq: Once | INTRAVENOUS | Status: DC | PRN

## 2023-04-20 MED ORDER — DEXAMETHASONE SODIUM PHOSPHATE 10 MG/ML IJ SOLN
8.0000 mg | Freq: Four times a day (QID) | INTRAMUSCULAR | Status: DC
  Administered 2023-04-20 – 2023-04-21 (×4): 8 mg via INTRAVENOUS
  Filled 2023-04-20 (×4): qty 1

## 2023-04-20 MED ORDER — PANTOPRAZOLE SODIUM 40 MG PO TBEC: 40.0000 mg | DELAYED_RELEASE_TABLET | Freq: Two times a day (BID) | ORAL | Status: DC

## 2023-04-20 MED ORDER — FENTANYL CITRATE PF 50 MCG/ML IJ SOSY
PREFILLED_SYRINGE | INTRAMUSCULAR | Status: AC
  Administered 2023-04-20: 50 ug
  Filled 2023-04-20: qty 2

## 2023-04-20 MED ORDER — SENNOSIDES-DOCUSATE SODIUM 8.6-50 MG PO TABS: 1.0000 | ORAL_TABLET | Freq: Two times a day (BID) | ORAL | Status: DC | PRN

## 2023-04-20 MED ORDER — HYDROCODONE-ACETAMINOPHEN 5-325 MG PO TABS
1.0000 | ORAL_TABLET | Freq: Four times a day (QID) | ORAL | Status: DC | PRN
  Administered 2023-04-20 – 2023-04-21 (×2): 1 via ORAL
  Filled 2023-04-20 (×2): qty 1

## 2023-04-20 MED ORDER — LIDOCAINE 2% (20 MG/ML) 5 ML SYRINGE
INTRAMUSCULAR | Status: DC | PRN
  Administered 2023-04-20: 100 mg via INTRAVENOUS

## 2023-04-20 MED ORDER — LEVOTHYROXINE SODIUM 50 MCG PO TABS
50.0000 ug | ORAL_TABLET | Freq: Every day | ORAL | Status: DC
  Administered 2023-04-21: 50 ug via ORAL
  Filled 2023-04-20: qty 1

## 2023-04-20 MED ORDER — ACETAMINOPHEN 325 MG PO TABS: 650.0000 mg | ORAL_TABLET | Freq: Four times a day (QID) | ORAL | Status: DC | PRN

## 2023-04-20 MED ORDER — METOCLOPRAMIDE HCL 5 MG/ML IJ SOLN
10.0000 mg | Freq: Four times a day (QID) | INTRAMUSCULAR | Status: DC
Start: 2023-04-20 — End: 2023-04-20

## 2023-04-20 MED ORDER — SIMVASTATIN 20 MG PO TABS
40.0000 mg | ORAL_TABLET | Freq: Every day | ORAL | Status: DC
  Administered 2023-04-20: 40 mg via ORAL
  Filled 2023-04-20: qty 2

## 2023-04-20 MED ORDER — TRAMADOL HCL 50 MG PO TABS: 25.0000 mg | ORAL_TABLET | Freq: Four times a day (QID) | ORAL | Status: DC | PRN

## 2023-04-20 MED ORDER — PROPOFOL 10 MG/ML IV BOLUS
INTRAVENOUS | Status: AC
  Filled 2023-04-20: qty 20

## 2023-04-20 MED ORDER — DEXAMETHASONE SODIUM PHOSPHATE 10 MG/ML IJ SOLN
INTRAMUSCULAR | Status: DC | PRN
  Administered 2023-04-20: 4 mg via INTRAVENOUS

## 2023-04-20 MED ORDER — ONDANSETRON HCL 4 MG/2ML IJ SOLN
4.0000 mg | Freq: Four times a day (QID) | INTRAMUSCULAR | Status: AC
  Administered 2023-04-20 – 2023-04-21 (×4): 4 mg via INTRAVENOUS
  Filled 2023-04-20 (×4): qty 2

## 2023-04-20 MED ORDER — ONDANSETRON HCL 4 MG/2ML IJ SOLN: 4.0000 mg | Freq: Four times a day (QID) | INTRAMUSCULAR | Status: DC

## 2023-04-20 MED ORDER — DEXAMETHASONE 4 MG PO TABS: 8.0000 mg | ORAL_TABLET | Freq: Four times a day (QID) | ORAL | Status: DC

## 2023-04-20 MED ORDER — METOCLOPRAMIDE HCL 5 MG/ML IJ SOLN
10.0000 mg | Freq: Four times a day (QID) | INTRAMUSCULAR | Status: AC
  Administered 2023-04-20 – 2023-04-21 (×4): 10 mg via INTRAVENOUS
  Filled 2023-04-20 (×4): qty 2

## 2023-04-20 MED ORDER — LIDOCAINE VISCOUS HCL 2 % MT SOLN: 5.0000 mL | Freq: Three times a day (TID) | OROMUCOSAL | Status: DC | PRN

## 2023-04-20 MED ORDER — LACTATED RINGERS IV SOLN: INTRAVENOUS | Status: DC

## 2023-04-20 MED ORDER — EPHEDRINE SULFATE-NACL 50-0.9 MG/10ML-% IV SOSY
PREFILLED_SYRINGE | INTRAVENOUS | Status: DC | PRN
  Administered 2023-04-20: 10 mg via INTRAVENOUS

## 2023-04-20 MED ORDER — DEXAMETHASONE SODIUM PHOSPHATE 10 MG/ML IJ SOLN
8.0000 mg | Freq: Four times a day (QID) | INTRAMUSCULAR | Status: DC
Start: 2023-04-20 — End: 2023-04-20

## 2023-04-20 MED ORDER — SCOPOLAMINE 1 MG/3DAYS TD PT72
1.0000 | MEDICATED_PATCH | Freq: Once | TRANSDERMAL | Status: DC
  Administered 2023-04-20: 1.5 mg via TRANSDERMAL

## 2023-04-20 MED ORDER — SCOPOLAMINE 1 MG/3DAYS TD PT72
MEDICATED_PATCH | TRANSDERMAL | Status: AC
  Filled 2023-04-20: qty 1

## 2023-04-20 SURGICAL SUPPLY — 16 items
BLOCK BITE 60FR ADLT L/F BLUE (MISCELLANEOUS) ×3 IMPLANT
ELECT REM PT RETURN 9FT ADLT (ELECTROSURGICAL)
ELECTRODE REM PT RTRN 9FT ADLT (ELECTROSURGICAL) IMPLANT
FORCEP RJ3 GP 1.8X160 W-NEEDLE (CUTTING FORCEPS) IMPLANT
FORCEPS BIOP RAD 4 LRG CAP 4 (CUTTING FORCEPS) IMPLANT
KIT ESOPHYX Z+ (Miscellaneous) IMPLANT
NDL SCLEROTHERAPY 25GX240 (NEEDLE) IMPLANT
NEEDLE SCLEROTHERAPY 25GX240 (NEEDLE) IMPLANT
PROBE APC STR FIRE (PROBE) IMPLANT
PROBE INJECTION GOLD (MISCELLANEOUS)
PROBE INJECTION GOLD 7FR (MISCELLANEOUS) IMPLANT
SNARE SHORT THROW 13M SML OVAL (MISCELLANEOUS) IMPLANT
SYR 50ML LL SCALE MARK (SYRINGE) IMPLANT
TUBING ENDO SMARTCAP PENTAX (MISCELLANEOUS) ×6 IMPLANT
TUBING IRRIGATION ENDOGATOR (MISCELLANEOUS) ×3 IMPLANT
WATER STERILE IRR 1000ML POUR (IV SOLUTION) IMPLANT

## 2023-04-20 NOTE — H&P (Signed)
GASTROENTEROLOGY PROCEDURE H&P NOTE   Primary Care Physician: Cleatis Polka., MD    Reason for Procedure:  GERD with erosive esophagitis  Plan: EGD with Transoral Incisionless Fundoplication (TIF) today in the [facility] Endoscopy unit  - Patient has codeine allergy (nausea/vomiting) - Patient has PCN and erythromycin allergy - Plan for admission for overnight observation after completion of TIF - Plan on starting Protonix 40 mg bid x 2 weeks in the postoperative setting to aid in recovery, then titrate back off - Additional recommendations pending completion of TIF   Patient is appropriate for endoscopic procedure(s) at [facility] Unit.  The nature of the procedure, as well as the risks, benefits, and alternatives were carefully and thoroughly reviewed with the patient. Ample time for discussion and questions allowed. The patient understood, was satisfied, and agreed to proceed.     HPI: Kim Chavez is a 77 y.o. female with history of CKD 3, anxiety, depression, OSA (on CPAP), HTN, hypothyroidism, migraines, GERD with erosive esophagitis who presents for Transoral Incisionless Fundoplication (TIF) for treatment of known reflux.  She has a long-standing hx of GERD and has been on multiple medications over the years as outlined below. Most recently recommended changed from Protonix to Dexilant 60 mg/day in 09/2022, but insurance would not cover Dexilant, so resumed Protonix 40 mg BID along with continued Pepcid and sucralfate.   Reports being recently diagnosed with CKD3, and she is very concerned about PPI effect, so stopped taking the Protonix.  Was still having breakthrough symptoms when taking high-dose PPI though.   GERD history: -Index symptoms: Heartburn, regurgitation, increased throat clearing.  No dysphagia -Exacerbating features: Spicy, tomato-based, soda. Supine. -Medications trialed: Pantoprazole, Pepcid, sucralfate, Nexium,  Prilosec, Prevacid -Current medications: Pepcid 20 mg qhs -Complications: EE   GERD evaluation: -Last EGD: 12/2021: LA Grade C esophagitis, Hill grade 2 valve -Barium esophagram: 12/2016: Normal motility -Esophageal Manometry: 01/2023: Normal relaxation of EG junction.  No dysmotility -pH/Impedance: None -Bravo: None   Endoscopic History: EGD 01/08/22 - LA Grade C reflux esophagitis with no bleeding. Biopsied.  Showed reflux related inflammation, negative for dysplasia or Barrett's esophagus. - Gastritis. Biopsied.  Negative for H. pylori bacterial infection, dysplasia or metaplasia. - Erythematous duodenopathy. Biopsied.   Colonoscopy 01/08/22 - Normal mucosa in the entire examined colon. Biopsied.  Negative for microscopic colitis - Mild diverticulosis in the sigmoid colon and in the descending colon. - Non-bleeding internal hemorrhoids.  Past Medical History:  Diagnosis Date   Anxiety    Breast cancer (HCC) 2011   BILATERAL BREAST REMOVED   Depression    Fibromyalgia    GERD (gastroesophageal reflux disease)    Hypertension    Hypothyroidism    Migraine    MIGRAINES   Overactive bladder    Pneumonia    several times   RUQ pain    Sleep apnea    uses c-pap machine   Thyroid disease     Past Surgical History:  Procedure Laterality Date   ANTERIOR AND POSTERIOR REPAIR N/A 01/05/2017   Procedure: ANTERIOR (CYSTOCELE) AND POSTERIOR REPAIR (RECTOCELE);  Surgeon: Huel Cote, MD;  Location: [facility];  Service: Gynecology;  Laterality: N/A;   APPENDECTOMY     CHOLECYSTECTOMY N/A 09/16/2015   Procedure: CHOLECYSTECTOMY;  Surgeon: Almond Lint, MD;  Location: [facility];  Service: General;  Laterality: N/A;   deviated septum surgery in 1980's     ESOPHAGEAL MANOMETRY N/A 01/19/2023   Procedure: ESOPHAGEAL MANOMETRY (  EM);  Surgeon: Napoleon Form, MD;  Location: [facility];  Service: Gastroenterology;  Laterality: N/A;   LAPAROSCOPIC LIVER CYST UNROOFING N/A 09/16/2015    Procedure: LAPAROSCOPIC LIVER CYST UNROOFING X'S 2;  Surgeon: Almond Lint, MD;  Location: [facility];  Service: General;  Laterality: N/A;   LAPAROSCOPIC VAGINAL HYSTERECTOMY WITH SALPINGO OOPHORECTOMY Bilateral 01/05/2017   Procedure: LAPAROSCOPIC ASSISTED VAGINAL HYSTERECTOMY WITH SALPINGO OOPHORECTOMY;  Surgeon: Huel Cote, MD;  Location: [facility];  Service: Gynecology;  Laterality: Bilateral;   LIVER SURGERY     SIMPLE MASTECTOMY W/ SENTINEL NODE BIOPSY Bilateral 12/13/2009   TUBAL LIGATION      Prior to Admission medications   Medication Sig Start Date End Date Taking? Authorizing Provider  azelastine (ASTELIN) 0.1 % nasal spray Place 2 sprays into both nostrils 2 (two) times daily. Use in each nostril as directed   Yes [provider]  cetirizine (ZYRTEC) 10 MG tablet Take 10 mg by mouth daily.   Yes [provider]  chlorpheniramine (CHLOR-TRIMETON) 4 MG tablet Take 4 mg by mouth at bedtime.   Yes [provider]  Cholecalciferol (VITAMIN D3) 50 MCG (2000 UT) capsule Take 2,000 Units by mouth daily.    Yes [provider]  Coenzyme Q10 (CO Q-10) 100 MG CAPS Take 100 mg by mouth every morning.   Yes [provider]  HYDROcodone-acetaminophen (NORCO/VICODIN) 5-325 MG tablet Take 1 tablet by mouth every 6 (six) hours as needed for moderate pain or severe pain.  08/19/17  Yes [provider]  hydrocortisone 2.5 % cream Apply 1 Application topically daily as needed (on ears fro rash).   Yes [provider]  Lactobacillus-Inulin (CULTURELLE DIGESTIVE DAILY PO) Take 1 capsule by mouth daily.   Yes [provider]  levothyroxine (SYNTHROID, LEVOTHROID) 50 MCG tablet Take 50 mcg by mouth daily before breakfast.   Yes [provider]  metoprolol succinate (TOPROL-XL) 50 MG 24 hr tablet Take 50 mg by mouth 2 (two) times daily. 10/27/21  Yes [provider]  NON FORMULARY CPAP: At bedtime (nightly)   Yes [provider]  NON FORMULARY Place 2 mg vaginally See admin instructions. Estradiol cream 2 mg every 6 days   Yes [provider]  Polyethyl Glycol-Propyl Glycol (SYSTANE OP) Place 1 drop into both eyes daily as needed (Dry eyes).   Yes [provider]  simvastatin (ZOCOR) 40 MG tablet Take 40 mg by mouth at bedtime. 05/31/19  Yes [provider]  Wheat Dextrin (BENEFIBER) POWD Use 1 tablespoon twice a day Patient taking differently: Take 15 mLs by mouth daily. 09/14/22  Yes Nandigam, Eleonore Chiquito, MD  zolpidem (AMBIEN) 10 MG tablet Take 10 mg by mouth at bedtime.   Yes [provider]  famotidine (PEPCID) 20 MG tablet Take 1 tablet (20 mg total) by mouth at bedtime as needed for heartburn or indigestion. Patient not taking: Reported on 04/18/2023 02/09/23   Napoleon Form, MD  pantoprazole (PROTONIX) 40 MG tablet Take 1 tablet (40 mg total) by mouth daily. Patient not taking: Reported on 04/18/2023 02/09/23   Napoleon Form, MD    No current facility-administered medications for this encounter.    Allergies as of 03/17/2023 - Review Complete 02/09/2023  Allergen Reaction Noted   Aspirin Other (See Comments) 12/02/2017   Erythromycin Nausea And Vomiting 05/01/2015   Ibuprofen Other (See Comments) 09/12/2015   Tape Rash 05/01/2015   Codeine Nausea And Vomiting 03/09/2014   Other Diarrhea 12/02/2017  Penicillins Swelling 03/09/2014   Neosporin [neomycin-bacitracin zn-polymyx] Itching, Rash, and Other (See Comments) 03/09/2014    Family History  Problem Relation Age of Onset   Breast cancer Mother    Colon polyps Mother    Diabetes Mother    CAD Father    Diabetes Maternal Grandmother    Colon polyps Maternal Grandfather    Celiac disease Son    Colon cancer Neg Hx    Stomach cancer Neg Hx    Esophageal cancer Neg Hx    Pancreatic cancer Neg Hx     Social History   Socioeconomic History   Marital status: Married    Spouse name: Not on  file   Number of children: 7   Years of education: Not on file   Highest education level: Not on file  Occupational History   Not on file  Tobacco Use   Smoking status: Former    Packs/day: 1.00    Years: 5.00    Additional pack years: 0.00    Total pack years: 5.00    Types: Cigarettes    Quit date: 09/12/1975    Years since quitting: 47.6   Smokeless tobacco: Never  Vaping Use   Vaping Use: Never used  Substance and Sexual Activity   Alcohol use: No   Drug use: No   Sexual activity: Not on file  Other Topics Concern   Not on file  Social History Narrative   7 biological children and one step son   Social Determinants of Health   Financial Resource Strain: Low Risk  (11/15/2018)   Overall Financial Resource Strain (CARDIA)    Difficulty of Paying Living Expenses: Not very hard  Food Insecurity: No Food Insecurity (11/15/2018)   Hunger Vital Sign    Worried About Running Out of Food in the Last Year: Never true    Ran Out of Food in the Last Year: Never true  Transportation Needs: No Transportation Needs (11/15/2018)   PRAPARE - Administrator, Civil Service (Medical): No    Lack of Transportation (Non-Medical): No  Physical Activity: Inactive (11/15/2018)   Exercise Vital Sign    Days of Exercise per Week: 0 days    Minutes of Exercise per Session: 0 min  Stress: Stress Concern Present (11/15/2018)   [employer] of Occupational Health - Occupational Stress Questionnaire    Feeling of Stress : To some extent  Social Connections: Moderately Integrated (11/15/2018)   Social Connection and Isolation Panel [NHANES]    Frequency of Communication with Friends and Family: More than three times a week    Frequency of Social Gatherings with Friends and Family: Once a week    Attends Religious Services: More than 4 times per year    Active Member of [employer] or Organizations: No    Attends Banker Meetings: Never    Marital Status: Married  Careers information officer Violence: Not At Risk (11/15/2018)   Humiliation, Afraid, Rape, and Kick questionnaire    Fear of Current or Ex-Partner: No    Emotionally Abused: No    Physically Abused: No    Sexually Abused: No    Physical Exam: Vital signs in last 24 hours: @Ht  5\' 3"  (1.6 m)   Wt 72.6 kg   BMI 28.34 kg/m  GEN: NAD EYE: Sclerae anicteric ENT: MMM CV: Non-tachycardic Pulm: CTA b/l GI: Soft, NT/ND NEURO:  Alert & Oriented x 3   Doristine Locks, DO [facility]   04/20/2023 7:53 AM

## 2023-04-20 NOTE — Interval H&P Note (Signed)
History and Physical Interval Note:  04/20/2023 8:12 AM  Kim Chavez  has presented today for surgery, with the diagnosis of GERD.  The various methods of treatment have been discussed with the patient and family. After consideration of risks, benefits and other options for treatment, the patient has consented to  Procedure(s): ESOPHAGOGASTRODUODENOSCOPY (EGD) WITH PROPOFOL (N/A) TRANSORAL INCISIONLESS FUNDOPLICATION (N/A) as a surgical intervention.  The patient's history has been reviewed, patient examined, no change in status, stable for surgery.  I have reviewed the patient's chart and labs.  Questions were answered to the patient's satisfaction.     [name]

## 2023-04-20 NOTE — Op Note (Signed)
[facility] Patient Name: Kim Chavez Procedure Date: 04/20/2023 MRN: [id number] Attending MD: Doristine Locks , MD, [id number] Date of Birth: Jan 16, 1946 CSN: [id number] Age: 77 Admit Type: Outpatient Procedure:                Upper GI endoscopy with Transoral Incisionless                            fundoplication, dilation, and bx Indications:              Heartburn, For therapy of reflux esophagitis Providers:                Doristine Locks, MD, Stephens Shire RN, RN, Leanne Lovely, Technician Referring MD:              Medicines:                General Anesthesia Complications:            No immediate complications. Estimated Blood Loss:     Estimated blood loss was minimal. Procedure:                Pre-Anesthesia Assessment:                           - Prior to the procedure, a History and Physical                            was performed, and patient medications and                            allergies were reviewed. The patient's tolerance of                            previous anesthesia was also reviewed. The risks                            and benefits of the procedure and the sedation                            options and risks were discussed with the patient.                            All questions were answered, and informed consent                            was obtained. Prior Anticoagulants: The patient has                            taken no anticoagulant or antiplatelet agents. ASA                            Grade Assessment: III - A patient with severe  systemic disease. After reviewing the risks and                            benefits, the patient was deemed in satisfactory                            condition to undergo the procedure.                           After obtaining informed consent, the endoscope was                            passed under direct vision. Throughout the                             procedure, the patient's blood pressure, pulse, and                            oxygen saturations were monitored continuously. The                            GIF-H190 ([id number]) Olympus endoscope was introduced                            through the mouth, and advanced to the second part                            of duodenum. The upper GI endoscopy was                            accomplished without difficulty. The patient                            tolerated the procedure well. Scope In: Scope Out: Findings:      The upper third of the esophagus, middle third of the esophagus and       lower third of the esophagus were normal. In preparation for the larger       caliber Esophyx device, the decision was made to perform esophageal       dilation. A guidewire was placed and the scope was withdrawn. Dilation       was performed with a Savary dilator with no resistance at 17 mm. The       dilation site was examined following endoscope reinsertion and showed no       bleeding, mucosal tear or perforation. Estimated blood loss: none.      LA Grade A (one or more mucosal breaks less than 5 mm, not extending       between tops of 2 mucosal folds) esophagitis with no bleeding was found       37 cm from the incisors. The decision was made to perform transoral       fundoplication with the EsophyX Z system. Before the procedure, the       gastroesophageal flap valve was classified as Hill Grade II (fold       present, opens with respiration). The endoscope was withdrawn, placed  through the plication device, reinserted into the patient and advanced       past the level of the GE junction at 37 cm from the incisors and into       the stomach. Next, the endoscope was advanced beyond the device and       retroflexed. The first plication site was identified at the 1 o'clock       position. With the device in the proper position, the helical retractor       was deployed and tissue  was pulled into the mold before it was closed.       The device was rotated, suction was applied using the invaginator, then       the device was advanced slightly and two H-shaped fasteners were placed.       The device was reloaded and the process repeated in order to deploy a       total of six fasteners at the first site. The device was then rotated to       the 11 o'clock position after which the helical retractor was used to       grasp additional tissue within the mold before rotation and deployment       of a total of four fasteners at the second site. To complete       reconstruction of the valve, additional fasteners were deployed at the       following sites: four fasteners at 5 o'clock and four fasteners at 7       o'clock positions. In total, 18 fasteners contributed to create a valve       measuring 3 cm in length which involved 300 degrees of the circumference       upon retroflexed view. Following the procedure, the flap valve was       reclassified as Hill Grade I (prominent fold, tight to endoscope). The       EsophyX device and endoscope were then removed. Relook endoscopy was       performed prior to the conclusion of the case to confirm the above       findings. Estimated blood loss was minimal.      Localized mild inflammation characterized by congestion (edema),       erosions and erythema was found in the gastric antrum. Biopsies were       taken with a cold forceps for Helicobacter pylori testing. Estimated       blood loss was minimal.      The examined duodenum was normal. Impression:               - Normal upper third of esophagus, middle third of                            esophagus and lower third of esophagus. Dilated.                           - LA Grade A reflux esophagitis with no bleeding.                           - Gastritis. Biopsied.                           - Normal examined duodenum.                           -  EsophyX transoral fundoplication was  performed. Moderate Sedation:      Not Applicable - Patient had care per Anesthesia. Recommendation:           -Admit to surgical ward for overnight observation                            with anticipated discharge tomorrow                           -Zofran 4 mg IV every 6 hours x24 hours, then prn                           -Reglan 10 mg every 6 hours x24 hours, then prn                           -Resume scopolamine patch x3 days (applied preop)                           -Protonix 40 mg p.o. BID x2 weeks, then 40 mg daily                            x2 weeks, then 20 mg daily x1 week then prn                           -Decadron 8 mg every 6 hours times max 5 doses                           -Gas-X (simethicone) 4225 mg p.o. prn every 6 hours                            gas pain, abdominal discomfort                           -Tramadol 25 m every 6 hours as needed for post                            operative pain.                           -Colace 100 mg p.o. twice daily if taking pain                            medications                           -Clear liquid diet okay overnight                           -Okay to ambulate with assist around the ward                           -Please do not hesitate to contact me directly with  any postoperative questions or concerns Procedure Code(s):        --- Professional ---                           [phone], Esophagogastroduodenoscopy, flexible,                            transoral; with insertion of guide wire followed by                            passage of dilator(s) through esophagus over guide                            wire                           43239, 59, Esophagogastroduodenoscopy, flexible,                            transoral; with biopsy, single or multiple Diagnosis Code(s):        --- Professional ---                           K21.00, Gastro-esophageal reflux disease with                             esophagitis, without bleeding                           K29.70, Gastritis, unspecified, without bleeding                           R12, Heartburn CPT copyright 2022 American Medical Association. All rights reserved. The codes documented in this report are preliminary and upon coder review may  be revised to meet current compliance requirements. Doristine Locks, MD 04/20/2023 9:55:45 AM Number of Addenda: 0

## 2023-04-20 NOTE — H&P (Signed)
History and Physical  Kim Chavez QMV:[id number] DOB: August 20, 1946 DOA: 04/20/2023  PCP: Cleatis Polka., MD   Chief Complaint: Post TIF Observation  HPI: Kim Chavez is a 77 y.o. female with medical history significant for severe GERD, hypertension, hyperlipidemia, obstructive sleep apnea being admitted to the hospital for observation after transoral incisionless fundoplication this morning with Dr. Barron Alvine.  She tolerated the procedure well, seen this morning in PACU, doing well with no complaints.  Review of Systems: Please see HPI for pertinent positives and negatives. A complete 10 system review of systems are otherwise negative.  Past Medical History:  Diagnosis Date   Anxiety    Breast cancer (HCC) 2011   BILATERAL BREAST REMOVED   Depression    Fibromyalgia    GERD (gastroesophageal reflux disease)    Hypertension    Hypothyroidism    Migraine    MIGRAINES   Overactive bladder    Pneumonia    several times   RUQ pain    Sleep apnea    uses c-pap machine   Thyroid disease    Past Surgical History:  Procedure Laterality Date   ANTERIOR AND POSTERIOR REPAIR N/A 01/05/2017   Procedure: ANTERIOR (CYSTOCELE) AND POSTERIOR REPAIR (RECTOCELE);  Surgeon: Huel Cote, MD;  Location: [facility];  Service: Gynecology;  Laterality: N/A;   APPENDECTOMY     CHOLECYSTECTOMY N/A 09/16/2015   Procedure: CHOLECYSTECTOMY;  Surgeon: Almond Lint, MD;  Location: [facility];  Service: General;  Laterality: N/A;   deviated septum surgery in 1980's     ESOPHAGEAL MANOMETRY N/A 01/19/2023   Procedure: ESOPHAGEAL MANOMETRY (EM);  Surgeon: Napoleon Form, MD;  Location: [facility];  Service: Gastroenterology;  Laterality: N/A;   LAPAROSCOPIC LIVER CYST UNROOFING N/A 09/16/2015   Procedure: LAPAROSCOPIC LIVER CYST UNROOFING X'S 2;  Surgeon: Almond Lint, MD;  Location: [facility];  Service: General;  Laterality: N/A;   LAPAROSCOPIC VAGINAL HYSTERECTOMY WITH SALPINGO  OOPHORECTOMY Bilateral 01/05/2017   Procedure: LAPAROSCOPIC ASSISTED VAGINAL HYSTERECTOMY WITH SALPINGO OOPHORECTOMY;  Surgeon: Huel Cote, MD;  Location: [facility];  Service: Gynecology;  Laterality: Bilateral;   LIVER SURGERY     SIMPLE MASTECTOMY W/ SENTINEL NODE BIOPSY Bilateral 12/13/2009   TUBAL LIGATION      Social History:  reports that she quit smoking about 47 years ago. Her smoking use included cigarettes. She has a 5.00 pack-year smoking history. She has never used smokeless tobacco. She reports that she does not drink alcohol and does not use drugs.   Allergies  Allergen Reactions   Aspirin Other (See Comments)    Almost caused ulcers    Erythromycin Nausea And Vomiting   Ibuprofen Other (See Comments)    Has Crohn's and was instructed to NOT take Has Crohn's and was instructed to NOT take   Tape Rash    Plastic    Codeine Nausea And Vomiting    'Deathly sick on stomach"    Nitrofurantoin     Other Reaction(s): abdominal pain   Other Diarrhea    Cramping also- Nothing with seeds/has Crohn's!!   Penicillins Swelling    Arms became swollen Has patient had a PCN reaction causing immediate rash, facial/tongue/throat swelling, SOB or lightheadedness with hypotension: Yes Has patient had a PCN reaction causing severe rash involving mucus membranes or skin necrosis: No Has patient had a PCN reaction that required hospitalization: No Has patient had a PCN reaction occurring within the last 10 years: No If all of the above answers are "  NO", then may proceed with Cephalosporin use.    Neosporin [Neomycin-Bacitracin Zn-Polymyx] Itching, Rash and Other (See Comments)    Turns red around the site and itches like crazy for about two weeks.     Family History  Problem Relation Age of Onset   Breast cancer Mother    Colon polyps Mother    Diabetes Mother    CAD Father    Diabetes Maternal Grandmother    Colon polyps Maternal Grandfather    Celiac disease Son    Colon  cancer Neg Hx    Stomach cancer Neg Hx    Esophageal cancer Neg Hx    Pancreatic cancer Neg Hx      Prior to Admission medications   Medication Sig Start Date End Date Taking? Authorizing Provider  azelastine (ASTELIN) 0.1 % nasal spray Place 2 sprays into both nostrils 2 (two) times daily. Use in each nostril as directed   Yes [provider]  cetirizine (ZYRTEC) 10 MG tablet Take 10 mg by mouth daily.   Yes [provider]  chlorpheniramine (CHLOR-TRIMETON) 4 MG tablet Take 4 mg by mouth at bedtime.   Yes [provider]  Cholecalciferol (VITAMIN D3) 50 MCG (2000 UT) capsule Take 2,000 Units by mouth daily.    Yes [provider]  Coenzyme Q10 (CO Q-10) 100 MG CAPS Take 100 mg by mouth every morning.   Yes [provider]  HYDROcodone-acetaminophen (NORCO/VICODIN) 5-325 MG tablet Take 1 tablet by mouth every 6 (six) hours as needed for moderate pain or severe pain.  08/19/17  Yes [provider]  hydrocortisone 2.5 % cream Apply 1 Application topically daily as needed (on ears fro rash).   Yes [provider]  Lactobacillus-Inulin (CULTURELLE DIGESTIVE DAILY PO) Take 1 capsule by mouth daily.   Yes [provider]  levothyroxine (SYNTHROID, LEVOTHROID) 50 MCG tablet Take 50 mcg by mouth daily before breakfast.   Yes [provider]  metoprolol succinate (TOPROL-XL) 50 MG 24 hr tablet Take 50 mg by mouth 2 (two) times daily. 10/27/21  Yes [provider]  NON FORMULARY CPAP: At bedtime (nightly)   Yes [provider]  NON FORMULARY Place 2 mg vaginally See admin instructions. Estradiol cream 2 mg every 6 days   Yes [provider]  Polyethyl Glycol-Propyl Glycol (SYSTANE OP) Place 1 drop into both eyes daily as needed (Dry eyes).   Yes [provider]  simvastatin (ZOCOR) 40 MG tablet Take 40 mg by mouth at bedtime. 05/31/19  Yes [provider]  Wheat Dextrin (BENEFIBER)  POWD Use 1 tablespoon twice a day Patient taking differently: Take 15 mLs by mouth daily. 09/14/22  Yes Nandigam, Eleonore Chiquito, MD  zolpidem (AMBIEN) 10 MG tablet Take 10 mg by mouth at bedtime.   Yes [provider]  famotidine (PEPCID) 20 MG tablet Take 1 tablet (20 mg total) by mouth at bedtime as needed for heartburn or indigestion. Patient not taking: Reported on 04/18/2023 02/09/23   Napoleon Form, MD  pantoprazole (PROTONIX) 40 MG tablet Take 1 tablet (40 mg total) by mouth daily. Patient not taking: Reported on 04/18/2023 02/09/23   Napoleon Form, MD    Physical Exam: BP (!) 155/82 (BP Location: Right Arm)   Pulse (!) 59   Temp (!) 97.5 F (36.4 C)   Resp 11   Ht 5\' 3"  (1.6 m)   Wt 72.6 kg   SpO2 94%   BMI 28.34 kg/m  General:  Alert, oriented, calm, in no acute distress  Eyes: EOMI, clear conjuctivae, white sclerea Neck: supple, no masses, trachea mildline  Cardiovascular: RRR, no murmurs or rubs, no peripheral edema  Respiratory: clear to auscultation bilaterally, no wheezes, no crackles  Abdomen: soft, nontender, nondistended, normal bowel tones heard  Skin: dry, no rashes  Musculoskeletal: no joint effusions, normal range of motion  Psychiatric: appropriate affect, normal speech  Neurologic: extraocular muscles intact, clear speech, moving all extremities with intact sensorium          Labs on Admission:  Basic Metabolic Panel: No results for input(s): "NA", "K", "CL", "CO2", "GLUCOSE", "BUN", "CREATININE", "CALCIUM", "MG", "PHOS" in the last 168 hours. Liver Function Tests: No results for input(s): "AST", "ALT", "ALKPHOS", "BILITOT", "PROT", "ALBUMIN" in the last 168 hours. No results for input(s): "LIPASE", "AMYLASE" in the last 168 hours. No results for input(s): "AMMONIA" in the last 168 hours. CBC: No results for input(s): "WBC", "NEUTROABS", "HGB", "HCT", "MCV", "PLT" in the last 168 hours. Cardiac Enzymes: No results for input(s): "CKTOTAL",  "CKMB", "CKMBINDEX", "TROPONINI" in the last 168 hours.  BNP (last 3 results) No results for input(s): "BNP" in the last 8760 hours.  ProBNP (last 3 results) No results for input(s): "PROBNP" in the last 8760 hours.  CBG: No results for input(s): "GLUCAP" in the last 168 hours.  Radiological Exams on Admission: No results found.  Assessment/Plan This is a pleasant 77 year old Caucasian female with a history of OSA, hypertension, hyperlipidemia, severe GERD and reflux esophagitis admitted to the hospital for observation after elective TIF with Dr. Barron Alvine this morning.  She is doing well. -Observation admission to MedSurg -Clear liquid diet tonight -Scheduled Zofran and Reglan for the next 24 hours, as needed thereafter -Pantoprazole 40 mg p.o. twice daily for the next 2 weeks, then 40 mg p.o. daily x 2 weeks, then 20 mg daily x 1 week then as needed only   GERD with esophagitis Active Problems:   Hypothyroidism-continue home Synthroid   HTN (hypertension)-continue home Toprol XL   HLD (hyperlipidemia)-continue home Zocor   OSA (obstructive sleep apnea)-continue home CPAP, she brought with her  DVT prophylaxis: SCDs for overnight Obs patient   Full Code  Consults called: Dr. Barron Alvine following  Admission status: Observation  Time spent: 42 minutes  Darriel Sinquefield Sharlette Dense MD [facility] Pager [phone]  If 7PM-7AM, please contact night-coverage [email or web address] Password [email or web address]  04/20/2023, 11:10 AM

## 2023-04-20 NOTE — Anesthesia Postprocedure Evaluation (Signed)
Anesthesia Post Note  Patient: Kim Chavez  Procedure(s) Performed: ESOPHAGOGASTRODUODENOSCOPY (EGD) WITH PROPOFOL TRANSORAL INCISIONLESS FUNDOPLICATION BIOPSY SAVORY DILATION     Patient location during evaluation: PACU Anesthesia Type: General Level of consciousness: sedated Pain management: pain level controlled Vital Signs Assessment: post-procedure vital signs reviewed and stable Respiratory status: spontaneous breathing and respiratory function stable Cardiovascular status: stable Postop Assessment: no apparent nausea or vomiting Anesthetic complications: no  No notable events documented.  Last Vitals:  Vitals:   04/20/23 1045 04/20/23 1100  BP: 133/70 (!) 155/82  Pulse: (!) 58 (!) 59  Resp: 12 11  Temp: (!) 36.4 C   SpO2: 96% 94%    Last Pain:  Vitals:   04/20/23 1100  TempSrc:   PainSc: 0-No pain                 Jullisa Grigoryan DANIEL

## 2023-04-20 NOTE — Anesthesia Procedure Notes (Signed)
Procedure Name: Intubation Date/Time: 04/20/2023 8:39 AM  Performed by: Nelle Don, CRNAPre-anesthesia Checklist: Patient identified, Emergency Drugs available, Suction available and Patient being monitored Patient Re-evaluated:Patient Re-evaluated prior to induction Oxygen Delivery Method: Circle system utilized Preoxygenation: Pre-oxygenation with 100% oxygen Induction Type: IV induction, Rapid sequence and Cricoid Pressure applied Laryngoscope Size: Mac and 4 Grade View: Grade I Tube type: Oral Tube size: 7.0 mm Number of attempts: 1 Airway Equipment and Method: Stylet Placement Confirmation: ETT inserted through vocal cords under direct vision, positive ETCO2 and breath sounds checked- equal and bilateral Secured at: 22 cm Tube secured with: Tape Dental Injury: Teeth and Oropharynx as per pre-operative assessment

## 2023-04-20 NOTE — Transfer of Care (Signed)
Immediate Anesthesia Transfer of Care Note  Patient: Kim Chavez  Procedure(s) Performed: ESOPHAGOGASTRODUODENOSCOPY (EGD) WITH PROPOFOL TRANSORAL INCISIONLESS FUNDOPLICATION BIOPSY ESOPHAGEAL DILATION  Patient Location: PACU  Anesthesia Type:General  Level of Consciousness: awake, alert , and oriented  Airway & Oxygen Therapy: Patient Spontanous Breathing and Patient connected to face mask oxygen  Post-op Assessment: Report given to RN, Post -op Vital signs reviewed and stable, and Patient moving all extremities X 4  Post vital signs: Reviewed and stable  Last Vitals:  Vitals Value Taken Time  BP 143/94 04/20/23 0933  Temp    Pulse 70 04/20/23 0934  Resp 12   SpO2 96 % 04/20/23 0934  Vitals shown include unvalidated device data.  Last Pain:  Vitals:   04/20/23 0751  TempSrc: Temporal  PainSc: 0-No pain         Complications: No notable events documented.

## 2023-04-21 ENCOUNTER — Encounter (HOSPITAL_COMMUNITY): Payer: Self-pay | Admitting: Gastroenterology

## 2023-04-21 DIAGNOSIS — K2100 Gastro-esophageal reflux disease with esophagitis, without bleeding: Secondary | ICD-10-CM | POA: Diagnosis not present

## 2023-04-21 DIAGNOSIS — I10 Essential (primary) hypertension: Secondary | ICD-10-CM

## 2023-04-21 DIAGNOSIS — K2990 Gastroduodenitis, unspecified, without bleeding: Secondary | ICD-10-CM

## 2023-04-21 DIAGNOSIS — K2970 Gastritis, unspecified, without bleeding: Secondary | ICD-10-CM

## 2023-04-21 DIAGNOSIS — R12 Heartburn: Secondary | ICD-10-CM | POA: Diagnosis not present

## 2023-04-21 DIAGNOSIS — E038 Other specified hypothyroidism: Secondary | ICD-10-CM | POA: Diagnosis not present

## 2023-04-21 DIAGNOSIS — Z853 Personal history of malignant neoplasm of breast: Secondary | ICD-10-CM | POA: Diagnosis not present

## 2023-04-21 DIAGNOSIS — E039 Hypothyroidism, unspecified: Secondary | ICD-10-CM | POA: Diagnosis not present

## 2023-04-21 LAB — SURGICAL PATHOLOGY

## 2023-04-21 MED ORDER — SIMETHICONE 80 MG PO CHEW: 80.0000 mg | CHEWABLE_TABLET | Freq: Four times a day (QID) | ORAL | 0 refills | Status: DC | PRN

## 2023-04-21 MED ORDER — METOCLOPRAMIDE HCL 10 MG PO TABS: 10.0000 mg | ORAL_TABLET | Freq: Four times a day (QID) | ORAL | 0 refills | Status: DC | PRN

## 2023-04-21 MED ORDER — ONDANSETRON HCL 4 MG PO TABS: 4.0000 mg | ORAL_TABLET | Freq: Three times a day (TID) | ORAL | 0 refills | Status: DC | PRN

## 2023-04-21 MED ORDER — PANTOPRAZOLE SODIUM 40 MG PO TBEC: 40.0000 mg | DELAYED_RELEASE_TABLET | Freq: Two times a day (BID) | ORAL | 1 refills | Status: DC

## 2023-04-21 MED ORDER — HYDROCODONE-ACETAMINOPHEN 5-325 MG PO TABS: 1.0000 | ORAL_TABLET | Freq: Four times a day (QID) | ORAL | 0 refills | Status: AC | PRN

## 2023-04-21 MED ORDER — ACETAMINOPHEN 325 MG PO TABS: 650.0000 mg | ORAL_TABLET | Freq: Four times a day (QID) | ORAL | Status: DC | PRN

## 2023-04-21 NOTE — TOC CM/SW Note (Signed)
  Transition of Care [facility]) Screening Note   Patient Details  Name: Kim Chavez Date of Birth: Apr 29, 1946   Transition of Care [facility]) CM/SW Contact:    Amada Jupiter, LCSW Phone Number: 04/21/2023, 9:30 AM    Transition of Care Department [facility]) has reviewed patient and no TOC needs have been identified at this time. We will continue to monitor patient advancement through interdisciplinary progression rounds. If new patient transition needs arise, please place a TOC consult.

## 2023-04-21 NOTE — Discharge Summary (Signed)
Physician Discharge Summary   Patient: Kim Chavez MRN: [id number] DOB: [birth year]  Admit date:     04/20/2023  Discharge date: 04/21/23  Discharge Physician: Meredeth Ide   PCP: Cleatis Polka., MD   Recommendations at discharge:   Follow-up gastroenterology on 05/05/2023  Discharge Diagnoses: Principal Problem:   GERD with esophagitis Active Problems:   Hypothyroidism   HTN (hypertension)   HLD (hyperlipidemia)   OSA (obstructive sleep apnea)   Gastritis and gastroduodenitis  Resolved Problems:   * No resolved hospital problems. *  Hospital Course:  77 y.o. female with medical history significant for severe GERD, hypertension, hyperlipidemia, obstructive sleep apnea being admitted to the hospital for observation after transoral incisionless fundoplication this morning with Dr. Barron Alvine.  She tolerated the procedure well, seen this morning in PACU, doing well with no complaints.     Assessment and Plan:  77 year old female with a history of OSA, hypertension, hyperlipidemia, severe GERD with reflux esophagitis verted hospital for observation after elective transoral incision less fundoplication done by Dr. Barron Alvine. Patient at this time is stable for discharge.  As per gastroenterology, she is okay to discharge with following plan.  - Protonix 40 mg PO BID for 2 weeks, then 40 mg daily for 2 weeks, then 20 mg daily for 1 week, then prn  - Discharge with Zofran 4 mg PO prn Q6 hours for nausea  - Discharge with Reglan 10 mg PO prn Q6 hours for nausea  - Discharge with Simethicone 80 mg PO prn Q6 hours for bloating/abdominal discomfort - Tylenol as needed for mild pain - Discharge home with Norco 5/325 mg, 1 tablet every 6 hours prn for moderate to severe pain (5-day supply)   Diet:  - 2 weeks of liquid soft foods followed by 4 weeks slowly progressive diet back to regular  - Provided with handout for post operative diet plan    Post Op Activity:  - Week 1:  encourage short distance walking, minimal physical activity, no lifting >5 lbs  - Week 2: Slow climbing stairs, no intense exercise, no lifting >5 lbs  - Week 3-6: No intense exercise, may lift up to 25 lbs  - Week 7: Resume normal activity    - To follow-up with me on 05/05/2023 at 3:40 PM in the [facility]       Consultants: Gastroenterology Procedures performed: TIF Disposition: Home Diet recommendation:  Discharge Diet Orders (From admission, onward)     Start     Ordered   04/21/23 0000  Diet - low sodium heart healthy        04/21/23 1048           Full liquid diet DISCHARGE MEDICATION: Allergies as of 04/21/2023       Reactions   Aspirin Other (See Comments)   Almost caused ulcers   Erythromycin Nausea And Vomiting   Ibuprofen Other (See Comments)   Has Crohn's and was instructed to NOT take Has Crohn's and was instructed to NOT take   Tape Rash   Plastic    Codeine Nausea And Vomiting   'Deathly sick on stomach"    Nitrofurantoin    Other Reaction(s): abdominal pain   Other Diarrhea   Cramping also- Nothing with seeds/has Crohn's!!   Penicillins Swelling   Arms became swollen Has patient had a PCN reaction causing immediate rash, facial/tongue/throat swelling, SOB or lightheadedness with hypotension: Yes Has patient had a PCN reaction causing severe rash involving mucus membranes  or skin necrosis: No Has patient had a PCN reaction that required hospitalization: No Has patient had a PCN reaction occurring within the last 10 years: No If all of the above answers are "NO", then may proceed with Cephalosporin use.   Neosporin [neomycin-bacitracin Zn-polymyx] Itching, Rash, Other (See Comments)   Turns red around the site and itches like crazy for about two weeks.         Medication List     STOP taking these medications    famotidine 20 MG tablet Commonly known as: PEPCID       TAKE these medications    acetaminophen 325 MG  tablet Commonly known as: TYLENOL Take 2 tablets (650 mg total) by mouth every 6 (six) hours as needed for fever or mild pain.   azelastine 0.1 % nasal spray Commonly known as: ASTELIN Place 2 sprays into both nostrils 2 (two) times daily. Use in each nostril as directed   Benefiber Powd Use 1 tablespoon twice a day What changed:  how much to take how to take this when to take this additional instructions   cetirizine 10 MG tablet Commonly known as: ZYRTEC Take 10 mg by mouth daily.   chlorpheniramine 4 MG tablet Commonly known as: CHLOR-TRIMETON Take 4 mg by mouth at bedtime.   Co Q-10 100 MG Caps Take 100 mg by mouth every morning.   CULTURELLE DIGESTIVE DAILY PO Take 1 capsule by mouth daily.   HYDROcodone-acetaminophen 5-325 MG tablet Commonly known as: NORCO/VICODIN Take 1 tablet by mouth every 6 (six) hours as needed for up to 5 days for moderate pain or severe pain.   hydrocortisone 2.5 % cream Apply 1 Application topically daily as needed (on ears fro rash).   levothyroxine 50 MCG tablet Commonly known as: SYNTHROID Take 50 mcg by mouth daily before breakfast.   metoCLOPramide 10 MG tablet Commonly known as: REGLAN Take 1 tablet (10 mg total) by mouth every 6 (six) hours as needed for up to 5 days for nausea or vomiting.   metoprolol succinate 50 MG 24 hr tablet Commonly known as: TOPROL-XL Take 50 mg by mouth 2 (two) times daily.   NON FORMULARY CPAP: At bedtime (nightly)   NON FORMULARY Place 2 mg vaginally See admin instructions. Estradiol cream 2 mg every 6 days   ondansetron 4 MG tablet Commonly known as: Zofran Take 1 tablet (4 mg total) by mouth every 8 (eight) hours as needed for nausea or vomiting.   pantoprazole 40 MG tablet Commonly known as: PROTONIX Take 1 tablet (40 mg total) by mouth 2 (two) times daily. Protonix 40 mg PO BID for 2 weeks, then 40 mg daily for 2 weeks, then 20 mg daily for 1 week, then as needed What changed:  when  to take this additional instructions   simethicone 80 MG chewable tablet Commonly known as: MYLICON Chew 1 tablet (80 mg total) by mouth every 6 (six) hours as needed for flatulence (gas, bloating, abdominal discomfort).   simvastatin 40 MG tablet Commonly known as: ZOCOR Take 40 mg by mouth at bedtime.   SYSTANE OP Place 1 drop into both eyes daily as needed (Dry eyes).   Vitamin D3 50 MCG (2000 UT) capsule Take 2,000 Units by mouth daily.   zolpidem 10 MG tablet Commonly known as: AMBIEN Take 10 mg by mouth at bedtime.        Discharge Exam: Filed Weights   04/18/23 1129 04/20/23 0751  Weight: 72.6 kg 72.6 kg  General-appears in no acute distress Heart-S1-S2, regular, no murmur auscultated Lungs-clear to auscultation bilaterally, no wheezing or crackles auscultated Abdomen-soft, nontender, no organomegaly Extremities-no edema in the lower extremities Neuro-alert, oriented x3, no focal deficit noted  Condition at discharge: good  The results of significant diagnostics from this hospitalization (including imaging, microbiology, ancillary and laboratory) are listed below for reference.   Imaging Studies: No results found.  Microbiology: Results for orders placed or performed during the hospital encounter of 10/26/19  Novel Coronavirus, NAA (Hosp order, Send-out to Ref Lab; TAT 18-24 hrs     Status: None   Collection Time: 10/26/19 11:35 AM   Specimen: Nasopharyngeal Swab; Respiratory  Result Value Ref Range Status   SARS-CoV-2, NAA NOT DETECTED NOT DETECTED Final    Comment: (NOTE) This nucleic acid amplification test was developed and its performance characteristics determined by [employer]. Nucleic acid amplification tests include PCR and TMA. This test has not been FDA cleared or approved. This test has been authorized by FDA under an Emergency Use Authorization (EUA). This test is only authorized for the duration of time the declaration  that circumstances exist justifying the authorization of the emergency use of in vitro diagnostic tests for detection of SARS-CoV-2 virus and/or diagnosis of COVID-19 infection under section 564(b)(1) of the Act, 21 U.S.C. 409WJX-9(J) (1), unless the authorization is terminated or revoked sooner. When diagnostic testing is negative, the possibility of a false negative result should be considered in the context of a patient's recent exposures and the presence of clinical signs and symptoms consistent with COVID-19. An individual without symptoms of COVID- 19 and who is not shedding SARS-CoV-2 vi rus would expect to have a negative (not detected) result in this assay. Performed At: [facility] [address] [place], [place] [id number] Jolene Schimke MD HY:[id number]    Coronavirus Source NASOPHARYNGEAL  Final    Comment: Performed at [facility], [address], [place], [place] [address]      Discharge time spent: greater than 30 minutes.  Signed: Meredeth Ide, MD [facility] 04/21/2023

## 2023-04-21 NOTE — Progress Notes (Signed)
Discharge instructions discussed with patient, verbalized agreement and understanding 

## 2023-04-21 NOTE — Progress Notes (Signed)
[facility] ROUNDING NOTE   Subjective: TIF completed yesterday without complications.  Patient did well overnight without any acute events.  Tolerating liquids and p.o. medications without issue.  Eager for discharge home later today.  Objective: Vital signs in last 24 hours: Temp:  [97.3 F (36.3 C)-98.9 F (37.2 C)] 97.7 F (36.5 C) (05/09 0420) Pulse Rate:  [55-69] 60 (05/09 0420) Resp:  [9-18] 18 (05/09 0420) BP: (109-174)/(56-94) 125/64 (05/09 0420) SpO2:  [93 %-99 %] 93 % (05/09 0420) Weight:  [72.6 kg] 72.6 kg (05/08 0751) Last BM Date : 04/19/23 General: NAD Lungs: CTA b/l, no w/r/r Heart: RRR, no m/r/g Abdomen:  Minimal post operative TTP in epigastrium and lower sternal area, without rebound or guarding. No peritoneal signs. Otherwise, soft, ND, +BS Ext: No c/c/e    Intake/Output from previous day: 05/08 0701 - 05/09 0700 In: 1550 [P.O.:720; I.V.:780; IV Piggyback:50] Out: 1000 [Urine:1000] Intake/Output this shift: No intake/output data recorded.   Lab Results: No results for input(s): "WBC", "HGB", "PLT", "MCV" in the last 72 hours. BMET No results for input(s): "NA", "K", "CL", "CO2", "GLUCOSE", "BUN", "CREATININE", "CALCIUM" in the last 72 hours. LFT No results for input(s): "PROT", "ALBUMIN", "AST", "ALT", "ALKPHOS", "BILITOT", "BILIDIR", "IBILI" in the last 72 hours. PT/INR No results for input(s): "INR" in the last 72 hours.    Imaging/Other results: No results found.    Assessment and Plan:   Impression and Recommendations:  Kim Chavez is a 77 y.o. female s/p EGD with Transoral Incisionless Fundoplication (TIF) completed yesterday with no events on overnight observation. Ok for d/c to home today with the following plan:   - Protonix 40 mg PO BID for 2 weeks, then 40 mg daily for 2 weeks, then 20 mg daily for 1 week, then prn  - Discharge with Zofran 4 mg PO prn Q6 hours for nausea  - Discharge with Reglan 10 mg PO prn  Q6 hours for nausea  - Discharge with Simethicone 80 mg PO prn Q6 hours for bloating/abdominal discomfort - Tylenol as needed for mild pain - Discharge home with Norco 5/325 mg, 1 tablet every 6 hours prn for moderate to severe pain (5-day supply)  Diet:  - 2 weeks of liquid soft foods followed by 4 weeks slowly progressive diet back to regular  - Provided with handout for post operative diet plan   Post Op Activity:  - Week 1: encourage short distance walking, minimal physical activity, no lifting >5 lbs  - Week 2: Slow climbing stairs, no intense exercise, no lifting >5 lbs  - Week 3-6: No intense exercise, may lift up to 25 lbs  - Week 7: Resume normal activity   - To follow-up with me on 05/05/2023 at 3:40 PM in the [facility]  - Sincerely appreciate the assistance by the Hospital service in the admission and overnight observation of this patient.   Shellia Cleverly, DO  04/21/2023, 7:50 AM [facility] Pager [phone]

## 2023-05-05 ENCOUNTER — Ambulatory Visit (INDEPENDENT_AMBULATORY_CARE_PROVIDER_SITE_OTHER): Payer: Medicare Other | Admitting: Gastroenterology

## 2023-05-05 ENCOUNTER — Encounter: Payer: Self-pay | Admitting: Gastroenterology

## 2023-05-05 VITALS — BP 136/70 | HR 72 | Ht 63.0 in | Wt 156.0 lb

## 2023-05-05 DIAGNOSIS — R1013 Epigastric pain: Secondary | ICD-10-CM | POA: Diagnosis not present

## 2023-05-05 DIAGNOSIS — Z9889 Other specified postprocedural states: Secondary | ICD-10-CM | POA: Diagnosis not present

## 2023-05-05 DIAGNOSIS — R110 Nausea: Secondary | ICD-10-CM | POA: Diagnosis not present

## 2023-05-05 DIAGNOSIS — K2100 Gastro-esophageal reflux disease with esophagitis, without bleeding: Secondary | ICD-10-CM

## 2023-05-05 DIAGNOSIS — K582 Mixed irritable bowel syndrome: Secondary | ICD-10-CM

## 2023-05-05 MED ORDER — ONDANSETRON HCL 4 MG PO TABS: 4.0000 mg | ORAL_TABLET | Freq: Three times a day (TID) | ORAL | 3 refills | Status: DC | PRN

## 2023-05-05 NOTE — Progress Notes (Signed)
Chief Complaint:   Postoperative follow-up  GI History: Kim Chavez is a 77 y.o. female with history of CKD 3, anxiety, depression, OSA (on CPAP), HTN, hypothyroidism, migraines, referred to me by Dr. Lavon Paganini for TIF for treatment of GERD with erosive esophagitis.  She has a long-standing hx of GERD and has been on multiple medications over the years as outlined below. Most recently recommended changed from Protonix to Dexilant 60 mg/day in 09/2022, but insurance would not cover Dexilant, so resumed Protonix 40 mg BID along with continued Pepcid and sucralfate.   Reports being recently diagnosed with CKD3, and she is very concerned about PPI effect, so stopped taking the Protonix.  Was still having breakthrough symptoms when taking high-dose PPI though.   GERD history: -Index symptoms: Heartburn, regurgitation, increased throat clearing.  No dysphagia -Exacerbating features: Spicy, tomato-based, soda. Supine. -Medications trialed: Pantoprazole, Pepcid, sucralfate, Nexium, Prilosec, Prevacid -Current medications: Protonix 40 mg bid -Complications: Erosive esophagitis   GERD evaluation: -Barium esophagram: 12/2016: Normal motility -Esophageal Manometry: 01/2023: Normal relaxation of EG junction.  No dysmotility -pH/Impedance: None -Bravo: None   Endoscopic History: - 01/08/2022: EGD: LA Grade C reflux esophagitis, non-H. pylori gastritis, erythematous duodenopathy (path benign) - 04/20/2023: EGD with TIF: LA Grade A esophagitis, Hill grade 2 valve, mild gastritis (path benign).  Successful TIF with 18 Serofuse fasteners placed   Separately, history of IBS mixed type and follows with Dr. Theadora Rama again.  Treated with Benefiber, lactose-free diet. - 07/01/2018: Colonoscopy at [facility]: multiple polyps removed descending colon, pathology consistent with tubular adenomas - 01/08/2022: Colonoscopy: Normal colon with biopsies negative for MC.  Sigmoid/descending colon diverticulosis, internal  hemorrhoids.  HPI:     Patient is a 77 y.o. female presenting to the Gastroenterology Clinic for postoperative follow-up after TIF on 04/20/2023.  TIF completed without issue and discharged home the following day.    Has done well with postoperative diet.  Does have some mild, intermittent nausea, which was well controlled with Zofran, but has since run out.    Has reduced pantoprazole to 40 mg daily without breakthorugh sxs. Has had some post prandial dyspepsia, but not HB or regurgitation. Tends to be related to certain foods, especially dairy (does have known lactose intolerance).  Throat clearing much improved.  Otherwise, she feels well and is very happy with the TIF results.  She was able to eat tomato gumbo soup without any heartburn and is delighted with that.  Review of systems:     No chest pain, no SOB, no fevers, no urinary sx   Past Medical History:  Diagnosis Date   Anxiety    Breast cancer (HCC) 2011   BILATERAL BREAST REMOVED   Depression    Fibromyalgia    GERD (gastroesophageal reflux disease)    Hypertension    Hypothyroidism    Migraine    MIGRAINES   Overactive bladder    Pneumonia    several times   RUQ pain    Sleep apnea    uses c-pap machine   Thyroid disease     Patient's surgical history, family medical history, social history, medications and allergies were all reviewed in Epic    Current Outpatient Medications  Medication Sig Dispense Refill   acetaminophen (TYLENOL) 325 MG tablet Take 2 tablets (650 mg total) by mouth every 6 (six) hours as needed for fever or mild pain.     azelastine (ASTELIN) 0.1 % nasal spray Place 2 sprays into both nostrils 2 (two) times  daily. Use in each nostril as directed     cetirizine (ZYRTEC) 10 MG tablet Take 10 mg by mouth daily.     chlorpheniramine (CHLOR-TRIMETON) 4 MG tablet Take 4 mg by mouth at bedtime.     Cholecalciferol (VITAMIN D3) 50 MCG (2000 UT) capsule Take 2,000 Units by mouth daily.      Coenzyme  Q10 (CO Q-10) 100 MG CAPS Take 100 mg by mouth every morning.     hydrocortisone 2.5 % cream Apply 1 Application topically daily as needed (on ears fro rash).     Lactobacillus-Inulin (CULTURELLE DIGESTIVE DAILY PO) Take 1 capsule by mouth daily.     levothyroxine (SYNTHROID, LEVOTHROID) 50 MCG tablet Take 50 mcg by mouth daily before breakfast.     metoprolol succinate (TOPROL-XL) 50 MG 24 hr tablet Take 50 mg by mouth 2 (two) times daily.     NON FORMULARY CPAP: At bedtime (nightly)     NON FORMULARY Place 2 mg vaginally See admin instructions. Estradiol cream 2 mg every 6 days     ondansetron (ZOFRAN) 4 MG tablet Take 1 tablet (4 mg total) by mouth every 8 (eight) hours as needed for nausea or vomiting. 20 tablet 0   pantoprazole (PROTONIX) 40 MG tablet Take 1 tablet (40 mg total) by mouth 2 (two) times daily. Protonix 40 mg PO BID for 2 weeks, then 40 mg daily for 2 weeks, then 20 mg daily for 1 week, then as needed 60 tablet 1   Polyethyl Glycol-Propyl Glycol (SYSTANE OP) Place 1 drop into both eyes daily as needed (Dry eyes).     simethicone (MYLICON) 80 MG chewable tablet Chew 1 tablet (80 mg total) by mouth every 6 (six) hours as needed for flatulence (gas, bloating, abdominal discomfort). 30 tablet 0   simvastatin (ZOCOR) 40 MG tablet Take 40 mg by mouth at bedtime.     Wheat Dextrin (BENEFIBER) POWD Use 1 tablespoon twice a day (Patient taking differently: Take 15 mLs by mouth daily.)  0   zolpidem (AMBIEN) 10 MG tablet Take 10 mg by mouth at bedtime.     metoCLOPramide (REGLAN) 10 MG tablet Take 1 tablet (10 mg total) by mouth every 6 (six) hours as needed for up to 5 days for nausea or vomiting. 20 tablet 0   No current facility-administered medications for this visit.    Physical Exam:     BP 136/70 (BP Location: Right Arm, Patient Position: Sitting, Cuff Size: Normal)   Pulse 72   Ht 5\' 3"  (1.6 m)   Wt 156 lb (70.8 kg)   BMI 27.63 kg/m   GENERAL:  Pleasant female in  NAD PSYCH: : Cooperative, normal affect Musculoskeletal:  Normal muscle tone, normal strength NEURO: Alert and oriented x 3, no focal neurologic deficits   IMPRESSION and PLAN:    1) GERD with erosive esophagitis 2) Fundoplication 77 year old female longstanding history of GERD c/b erosive esophagitis, now s/p TIF earlier this month with excellent clinical response.  Has already weaned her PPI down to Protonix 40 mg daily without breakthrough heartburn or regurgitation.  Throat clearing has also much improved. - Continue Protonix 40 mg daily x 2 weeks, then wean off completely - Continue slowly advancing diet per postoperative protocol - Continue advancing activity per postoperative protocol  3) Nausea without emesis - Rx for Zofran 4 mg prn Q6 hours for nausea  4) Dyspepsia Symptoms tend to be related to ingestion of dairy foods.  Does have known history of  lactose intolerance - Diet diary to try to better correlate symptomatology - Avoidance of exacerbating foods  5) IBS mixed type - Continue current management  RTC in 3 months or sooner prn. If still doing well, will release her continued GI care to Dr. Lavon Paganini.           Shellia Cleverly ,DO, [facility] 05/05/2023, 3:33 PM

## 2023-05-05 NOTE — Patient Instructions (Addendum)
You have been scheduled for an appointment with Dr. Barron Alvine on 08/05/23 at 10:40 . Please arrive 10 minutes early for your appointment.   We have sent the following medications to your pharmacy for you to pick up at your convenience: Zofran   _______________________________________________________  If your blood pressure at your visit was 140/90 or greater, please contact your primary care physician to follow up on this.  _______________________________________________________  If you are age 70 or older, your body mass index should be between 23-30. Your Body mass index is 27.63 kg/m. If this is out of the aforementioned range listed, please consider follow up with your Primary Care Provider.  __________________________________________________________  The [place] [facility] providers would like to encourage you to use [facility] to communicate with providers for non-urgent requests or questions.  Due to long hold times on the telephone, sending your provider a message by [facility] may be a faster and more efficient way to get a response.  Please allow 48 business hours for a response.  Please remember that this is for non-urgent requests.   Due to recent changes in healthcare laws, you may see the results of your imaging and laboratory studies on MyChart before your provider has had a chance to review them.  We understand that in some cases there may be results that are confusing or concerning to you. Not all laboratory results come back in the same time frame and the provider may be waiting for multiple results in order to interpret others.  Please give [place] 48 hours in order for your provider to thoroughly review all the results before contacting the office for clarification of your results.    Thank you for choosing me and [facility].  Vito Cirigliano, D.O.

## 2023-08-05 ENCOUNTER — Ambulatory Visit: Payer: Medicare Other | Admitting: Gastroenterology

## 2023-08-05 ENCOUNTER — Telehealth: Payer: Self-pay | Admitting: Gastroenterology

## 2023-08-05 NOTE — Telephone Encounter (Signed)
Good Morning Dr. Barron Alvine,  Patient called stating that she would not be able to make her 10:40 appointment with yu this morning due to being sick.   Patient was rescheduled for 12/5 at 10:40

## 2023-10-12 DIAGNOSIS — E039 Hypothyroidism, unspecified: Secondary | ICD-10-CM | POA: Diagnosis not present

## 2023-10-12 DIAGNOSIS — E041 Nontoxic single thyroid nodule: Secondary | ICD-10-CM | POA: Diagnosis not present

## 2023-10-18 DIAGNOSIS — R3915 Urgency of urination: Secondary | ICD-10-CM | POA: Diagnosis not present

## 2023-10-18 DIAGNOSIS — N281 Cyst of kidney, acquired: Secondary | ICD-10-CM | POA: Diagnosis not present

## 2023-10-18 DIAGNOSIS — N200 Calculus of kidney: Secondary | ICD-10-CM | POA: Diagnosis not present

## 2023-10-18 DIAGNOSIS — N3020 Other chronic cystitis without hematuria: Secondary | ICD-10-CM | POA: Diagnosis not present

## 2023-11-03 DIAGNOSIS — E039 Hypothyroidism, unspecified: Secondary | ICD-10-CM | POA: Diagnosis not present

## 2023-11-03 DIAGNOSIS — R7301 Impaired fasting glucose: Secondary | ICD-10-CM | POA: Diagnosis not present

## 2023-11-03 DIAGNOSIS — N1831 Chronic kidney disease, stage 3a: Secondary | ICD-10-CM | POA: Diagnosis not present

## 2023-11-03 DIAGNOSIS — I129 Hypertensive chronic kidney disease with stage 1 through stage 4 chronic kidney disease, or unspecified chronic kidney disease: Secondary | ICD-10-CM | POA: Diagnosis not present

## 2023-11-03 DIAGNOSIS — M8580 Other specified disorders of bone density and structure, unspecified site: Secondary | ICD-10-CM | POA: Diagnosis not present

## 2023-11-03 DIAGNOSIS — E041 Nontoxic single thyroid nodule: Secondary | ICD-10-CM | POA: Diagnosis not present

## 2023-11-03 DIAGNOSIS — E785 Hyperlipidemia, unspecified: Secondary | ICD-10-CM | POA: Diagnosis not present

## 2023-11-09 DIAGNOSIS — I700 Atherosclerosis of aorta: Secondary | ICD-10-CM | POA: Diagnosis not present

## 2023-11-09 DIAGNOSIS — R82998 Other abnormal findings in urine: Secondary | ICD-10-CM | POA: Diagnosis not present

## 2023-11-09 DIAGNOSIS — G4700 Insomnia, unspecified: Secondary | ICD-10-CM | POA: Diagnosis not present

## 2023-11-09 DIAGNOSIS — N1831 Chronic kidney disease, stage 3a: Secondary | ICD-10-CM | POA: Diagnosis not present

## 2023-11-09 DIAGNOSIS — Z0000 Encounter for general adult medical examination without abnormal findings: Secondary | ICD-10-CM | POA: Diagnosis not present

## 2023-11-09 DIAGNOSIS — R7301 Impaired fasting glucose: Secondary | ICD-10-CM | POA: Diagnosis not present

## 2023-11-09 DIAGNOSIS — Z1339 Encounter for screening examination for other mental health and behavioral disorders: Secondary | ICD-10-CM | POA: Diagnosis not present

## 2023-11-09 DIAGNOSIS — E039 Hypothyroidism, unspecified: Secondary | ICD-10-CM | POA: Diagnosis not present

## 2023-11-09 DIAGNOSIS — E785 Hyperlipidemia, unspecified: Secondary | ICD-10-CM | POA: Diagnosis not present

## 2023-11-09 DIAGNOSIS — K219 Gastro-esophageal reflux disease without esophagitis: Secondary | ICD-10-CM | POA: Diagnosis not present

## 2023-11-09 DIAGNOSIS — M797 Fibromyalgia: Secondary | ICD-10-CM | POA: Diagnosis not present

## 2023-11-09 DIAGNOSIS — E041 Nontoxic single thyroid nodule: Secondary | ICD-10-CM | POA: Diagnosis not present

## 2023-11-09 DIAGNOSIS — I129 Hypertensive chronic kidney disease with stage 1 through stage 4 chronic kidney disease, or unspecified chronic kidney disease: Secondary | ICD-10-CM | POA: Diagnosis not present

## 2023-11-09 DIAGNOSIS — N281 Cyst of kidney, acquired: Secondary | ICD-10-CM | POA: Diagnosis not present

## 2023-11-09 DIAGNOSIS — Z1331 Encounter for screening for depression: Secondary | ICD-10-CM | POA: Diagnosis not present

## 2023-11-17 ENCOUNTER — Ambulatory Visit: Payer: Medicare Other | Admitting: Gastroenterology

## 2023-11-17 ENCOUNTER — Encounter: Payer: Self-pay | Admitting: Gastroenterology

## 2023-11-17 VITALS — BP 130/80 | HR 70 | Ht 63.0 in | Wt 147.0 lb

## 2023-11-17 DIAGNOSIS — K582 Mixed irritable bowel syndrome: Secondary | ICD-10-CM | POA: Diagnosis not present

## 2023-11-17 DIAGNOSIS — K2100 Gastro-esophageal reflux disease with esophagitis, without bleeding: Secondary | ICD-10-CM

## 2023-11-17 DIAGNOSIS — Z9889 Other specified postprocedural states: Secondary | ICD-10-CM

## 2023-11-17 MED ORDER — PANTOPRAZOLE SODIUM 40 MG PO TBEC: 40.0000 mg | DELAYED_RELEASE_TABLET | Freq: Every day | ORAL | 3 refills | Status: DC

## 2023-11-17 NOTE — Patient Instructions (Signed)
_______________________________________________________  If your blood pressure at your visit was 140/90 or greater, please contact your primary care physician to follow up on this.  _______________________________________________________  If you are age 77 or older, your body mass index should be between 23-30. Your Body mass index is 26.04 kg/m. If this is out of the aforementioned range listed, please consider follow up with your Primary Care Provider.  If you are age 65 or younger, your body mass index should be between 19-25. Your Body mass index is 26.04 kg/m. If this is out of the aformentioned range listed, please consider follow up with your Primary Care Provider.   ________________________________________________________  The [place] [facility] providers would like to encourage you to use [facility] to communicate with providers for non-urgent requests or questions.  Due to long hold times on the telephone, sending your provider a message by [facility] may be a faster and more efficient way to get a response.  Please allow 48 business hours for a response.  Please remember that this is for non-urgent requests.  _______________________________________________________  We have sent the following medications to your pharmacy for you to pick up at your convenience:  CONTINUE: Protonix 40mg  daily  You will follow up with me on an as needed basis. You will follow up with Dr Lavon Paganini in 1 year.  It was a pleasure to see you today!  Vito Cirigliano, D.O.

## 2023-11-17 NOTE — Progress Notes (Signed)
Chief Complaint:   GERD  GI History: Kim Chavez is a 77 y.o. female with history of CKD 3, anxiety, depression, OSA (on CPAP), HTN, hypothyroidism, migraines, referred to me by Dr. Lavon Chavez for TIF for treatment of GERD with erosive esophagitis.   She has a long-standing hx of GERD and has been on multiple medications over the years as outlined below. Most recently recommended changed from Protonix to Dexilant 60 mg/day in 09/2022, but insurance would not cover Dexilant, so resumed Protonix 40 mg BID along with continued Pepcid and sucralfate.   Reports being recently diagnosed with CKD3, and she is very concerned about PPI effect, so stopped taking the Protonix.  Was still having breakthrough symptoms when taking high-dose PPI though.   GERD history: -Index symptoms: Heartburn, regurgitation, increased throat clearing.  No dysphagia -Exacerbating features: Spicy, tomato-based, soda. Supine. -Medications trialed: Pantoprazole, Pepcid, sucralfate, Nexium, Prilosec, Prevacid -Current medications: Protonix 40 mg bid -Complications: Erosive esophagitis   GERD evaluation: -Barium esophagram: 12/2016: Normal motility -Esophageal Manometry: 01/2023: Normal relaxation of EG junction.  No dysmotility -pH/Impedance: None -Bravo: None   Endoscopic History: - 01/08/2022: EGD: LA Grade C reflux esophagitis, non-H. pylori gastritis, erythematous duodenopathy (path benign) - 04/20/2023: EGD with TIF: LA Grade A esophagitis, Hill grade 2 valve, mild gastritis (path benign).  Successful TIF with 18 Serofuse fasteners placed     Separately, history of IBS mixed type and follows with Kim Chavez again.  Treated with Benefiber, lactose-free diet. - 07/01/2018: Colonoscopy at [facility]: multiple polyps removed descending colon, pathology consistent with tubular adenomas - 01/08/2022: Colonoscopy: Normal colon with biopsies negative for MC.  Sigmoid/descending colon diverticulosis, internal  hemorrhoids.  HPI:     Patient is a 77 y.o. female presenting to the Gastroenterology Clinic for follow-up.  Was last seen by me on 05/05/2023 for follow-up after TIF in 04/20/2023.  Was doing well postoperatively without any breakthrough reflux symptoms.  Did have some lingering postprandial dyspepsia, but not heartburn or regurgitation and throat clearing was much improved.  Tried weaning off Protonix, but had breakthrough heartburn and regurgitation. Tends to be post prandial. Now back on 40 mg daily, and sxs are well controlled.  Overall, feels much better since TIF (reflux was poorly controlled and regular breakthrough despite high-dose PPI prior to TIF) and still overall quite happy that she went through with the procedure.  Requesting refill of Protonix today. Just had appointment with her PCM and reports normal BMP, B12, CBC.     Review of systems:     No chest pain, no SOB, no fevers, no urinary sx   Past Medical History:  Diagnosis Date   Anxiety    Breast cancer (HCC) 2011   BILATERAL BREAST REMOVED   Depression    Fibromyalgia    GERD (gastroesophageal reflux disease)    Hypertension    Hypothyroidism    Migraine    MIGRAINES   Overactive bladder    Pneumonia    several times   RUQ pain    Sleep apnea    uses c-pap machine   Thyroid disease     Patient's surgical history, family medical history, social history, medications and allergies were all reviewed in Epic    Current Outpatient Medications  Medication Sig Dispense Refill   azelastine (ASTELIN) 0.1 % nasal spray Place 2 sprays into both nostrils 2 (two) times daily. Use in each nostril as directed     cetirizine (ZYRTEC) 10 MG tablet Take 10 mg by  mouth daily.     chlorpheniramine (CHLOR-TRIMETON) 4 MG tablet Take 4 mg by mouth at bedtime.     Cholecalciferol (VITAMIN D3) 50 MCG (2000 UT) capsule Take 2,000 Units by mouth daily.      Coenzyme Q10 (CO Q-10) 100 MG CAPS Take 100 mg by mouth every morning.      hydrocortisone 2.5 % cream Apply 1 Application topically daily as needed (on ears fro rash).     Lactobacillus-Inulin (CULTURELLE DIGESTIVE DAILY PO) Take 1 capsule by mouth daily.     levothyroxine (SYNTHROID, LEVOTHROID) 50 MCG tablet Take 50 mcg by mouth daily before breakfast.     metoprolol succinate (TOPROL-XL) 50 MG 24 hr tablet Take 50 mg by mouth 2 (two) times daily.     NON FORMULARY CPAP: At bedtime (nightly)     NON FORMULARY Place 2 mg vaginally See admin instructions. Estradiol cream 2 mg every 6 days     ondansetron (ZOFRAN) 4 MG tablet Take 1 tablet (4 mg total) by mouth every 8 (eight) hours as needed for nausea or vomiting. 30 tablet 3   pantoprazole (PROTONIX) 40 MG tablet Take 1 tablet (40 mg total) by mouth 2 (two) times daily. Protonix 40 mg PO BID for 2 weeks, then 40 mg daily for 2 weeks, then 20 mg daily for 1 week, then as needed 60 tablet 1   simethicone (MYLICON) 80 MG chewable tablet Chew 1 tablet (80 mg total) by mouth every 6 (six) hours as needed for flatulence (gas, bloating, abdominal discomfort). 30 tablet 0   simvastatin (ZOCOR) 40 MG tablet Take 40 mg by mouth at bedtime.     Wheat Dextrin (BENEFIBER) POWD Use 1 tablespoon twice a day (Patient taking differently: Take 15 mLs by mouth daily.)  0   zolpidem (AMBIEN) 10 MG tablet Take 10 mg by mouth at bedtime.     No current facility-administered medications for this visit.    Physical Exam:     BP 130/80   Pulse 70   Ht 5\' 3"  (1.6 m)   Wt 147 lb (66.7 kg)   BMI 26.04 kg/m   GENERAL:  Pleasant female in NAD PSYCH: : Cooperative, normal affect NEURO: Alert and oriented x 3, no focal neurologic deficits   IMPRESSION and PLAN:    1) GERD with erosive esophagitis 2) Fundoplication 77 year old female longstanding history of GERD c/b erosive esophagitis, now s/p TIF in 04/2023.  Has had excellent response to fundoplication.  Throat clearing has resolved, but still with intermittent breakthrough  heartburn/regurgitation, which is now completely controlled since restarting Protonix 40 mg daily.  Overall represents vast improvement from prior to surgery, and patient overall very pleased with results. - Resume Protonix 40 mg daily - Placed refill for Protonix today - If symptoms still well-controlled, can consider reducing to 20 mg daily and can always consider weaning back off therapy in the future to evaluate - Will release her care back to Dr. Lavon Chavez for her other GI symptoms.  Can otherwise follow-up with me as needed  3) IBS mixed type - Continue current management - As above, we will release her care back to Dr. Rolan Lipa ,DO, [facility] 11/17/2023, 11:10 AM

## 2023-12-01 DIAGNOSIS — I998 Other disorder of circulatory system: Secondary | ICD-10-CM | POA: Diagnosis not present

## 2023-12-01 DIAGNOSIS — I10 Essential (primary) hypertension: Secondary | ICD-10-CM | POA: Diagnosis not present

## 2023-12-05 DIAGNOSIS — I10 Essential (primary) hypertension: Secondary | ICD-10-CM | POA: Diagnosis not present

## 2023-12-05 DIAGNOSIS — I998 Other disorder of circulatory system: Secondary | ICD-10-CM | POA: Diagnosis not present

## 2023-12-21 ENCOUNTER — Encounter (HOSPITAL_BASED_OUTPATIENT_CLINIC_OR_DEPARTMENT_OTHER): Payer: Self-pay | Admitting: Pulmonary Disease

## 2023-12-21 ENCOUNTER — Ambulatory Visit (HOSPITAL_BASED_OUTPATIENT_CLINIC_OR_DEPARTMENT_OTHER): Payer: Medicare Other | Admitting: Pulmonary Disease

## 2023-12-21 VITALS — BP 144/78 | HR 67 | Ht 63.0 in | Wt 146.0 lb

## 2023-12-21 DIAGNOSIS — G4733 Obstructive sleep apnea (adult) (pediatric): Secondary | ICD-10-CM

## 2023-12-21 NOTE — Patient Instructions (Signed)
  CPAP is working well. Change to auto settings 8 to 12 cm

## 2023-12-21 NOTE — Progress Notes (Signed)
 Subjective:    Patient ID: Kim Chavez, female    DOB: March 27, 1946, 78 y.o.   MRN: [id number]  HPI  78  yo remote smoker for FU of mild OSA PMH - chronic migraine headaches on propranolol.  2016  surgery for large liver cysts CKD-3   Meds - Albuterol  and Atrovent  nebs caused palpitations.  Singulair  caused depressed mood   Discussed the use of AI scribe software for clinical note transcription with the patient, who gave verbal consent to proceed.  History of Present Illness   The patient, with a history of sleep apnea, presents for a follow-up visit. She reports feeling 'overall better' when using her CPAP machine, noting fewer headaches and increased energy. She uses a full coverage nose mask, which she finds comfortable, though it can cause some dryness. She has been using the machine regularly, with exceptions during camping trips without electricity.  In addition to sleep apnea, the patient has been experiencing fluctuating blood pressure, despite recent weight loss of approximately twenty-five pounds following stomach surgery. She has started a new medication to manage this. The patient also mentions the stress of caring for her ill husband, which may be contributing to her blood pressure issues.      CPAP DL shows good control of events, with avg pr 11.7 on auto 5-12 cm, residual AHI 4.4 avg , on certain nights as high as 10/h, excellent compliance 86% > 4h  Significant tests/ events reviewed   12/2018 PFTs >> normal PSG 02/2017 showed AHI of 5/hour with total sleep time of 5 hours, she slept supine for 1 hour with AHI of 25/hour.  Lowest desaturation was 76%, she spent 195 minutes with saturation less than 88%.     PSG in 08/2011 >> AHI of 22/hour, supine AHI was 61/hour.  CPAP was titrated to 7 cm   HST 11/2017 >>moderate OSA with 21 events per hour.    CT chest/abdomen/pelvis 05/2019 liver cysts, not impressed with bibasal scarring CT scan from 04/2015 and chest x-ray  from 07/2015  showed elevated right hemidiaphragm. Sniff test 10/2017 normal movement of BL diaphragms   10/2017 esophagram - normal  Review of Systems neg for any significant sore throat, dysphagia, itching, sneezing, nasal congestion or excess/ purulent secretions, fever, chills, sweats, unintended wt loss, pleuritic or exertional cp, hempoptysis, orthopnea pnd or change in chronic leg swelling. Also denies presyncope, palpitations, heartburn, abdominal pain, nausea, vomiting, diarrhea or change in bowel or urinary habits, dysuria,hematuria, rash, arthralgias, visual complaints, headache, numbness weakness or ataxia.     Objective:   Physical Exam  Gen. Pleasant, well-nourished, in no distress ENT - no thrush, no pallor/icterus,no post nasal drip Neck: No JVD, no thyromegaly, no carotid bruits Lungs: no use of accessory muscles, no dullness to percussion, clear without rales or rhonchi  Cardiovascular: Rhythm regular, heart sounds  normal, no murmurs or gallops, no peripheral edema Musculoskeletal: No deformities, no cyanosis or clubbing        Assessment & Plan:    Assessment and Plan    Obstructive Sleep Apnea Reports overall improvement with CPAP therapy, including fewer headaches and increased energy. Using a full-face mask with good seal and tolerable dryness. Machine settings are effective with events reduced to about four per hour, within the acceptable range. Compliance is high except during camping trips without electricity. Discussed the importance of regular use and the potential impact of non-compliance on symptoms. Prefers current mask and settings, with minor adjustments as needed. - Tweak  CPAP settings slightly - Continue current CPAP therapy - Ensure regular supply of CPAP accessories - Follow up annually or as needed  Hypertension Reports fluctuating blood pressure despite recent weight loss of approximately 25 pounds. New antihypertensive medication started.  Possible contributing factor includes stress due to spouse's illness. Discussed the potential impact of stress on blood pressure and the importance of monitoring. - Monitor blood pressure closely - Continue current antihypertensive medication - Address potential stress factors.

## 2024-01-10 DIAGNOSIS — N952 Postmenopausal atrophic vaginitis: Secondary | ICD-10-CM | POA: Diagnosis not present

## 2024-01-10 DIAGNOSIS — N393 Stress incontinence (female) (male): Secondary | ICD-10-CM | POA: Diagnosis not present

## 2024-01-10 DIAGNOSIS — Z01411 Encounter for gynecological examination (general) (routine) with abnormal findings: Secondary | ICD-10-CM | POA: Diagnosis not present

## 2024-01-17 DIAGNOSIS — I10 Essential (primary) hypertension: Secondary | ICD-10-CM | POA: Diagnosis not present

## 2024-01-26 DIAGNOSIS — E039 Hypothyroidism, unspecified: Secondary | ICD-10-CM | POA: Diagnosis not present

## 2024-01-26 DIAGNOSIS — D0502 Lobular carcinoma in situ of left breast: Secondary | ICD-10-CM | POA: Diagnosis not present

## 2024-01-26 DIAGNOSIS — R918 Other nonspecific abnormal finding of lung field: Secondary | ICD-10-CM | POA: Diagnosis not present

## 2024-01-26 DIAGNOSIS — Z9013 Acquired absence of bilateral breasts and nipples: Secondary | ICD-10-CM | POA: Diagnosis not present

## 2024-01-26 DIAGNOSIS — I10 Essential (primary) hypertension: Secondary | ICD-10-CM | POA: Diagnosis not present

## 2024-01-26 DIAGNOSIS — K760 Fatty (change of) liver, not elsewhere classified: Secondary | ICD-10-CM | POA: Diagnosis not present

## 2024-03-15 DIAGNOSIS — D2239 Melanocytic nevi of other parts of face: Secondary | ICD-10-CM | POA: Diagnosis not present

## 2024-03-15 DIAGNOSIS — L821 Other seborrheic keratosis: Secondary | ICD-10-CM | POA: Diagnosis not present

## 2024-03-15 DIAGNOSIS — D2271 Melanocytic nevi of right lower limb, including hip: Secondary | ICD-10-CM | POA: Diagnosis not present

## 2024-03-15 DIAGNOSIS — D2262 Melanocytic nevi of left upper limb, including shoulder: Secondary | ICD-10-CM | POA: Diagnosis not present

## 2024-03-15 DIAGNOSIS — D1801 Hemangioma of skin and subcutaneous tissue: Secondary | ICD-10-CM | POA: Diagnosis not present

## 2024-03-15 DIAGNOSIS — D225 Melanocytic nevi of trunk: Secondary | ICD-10-CM | POA: Diagnosis not present

## 2024-03-15 DIAGNOSIS — R208 Other disturbances of skin sensation: Secondary | ICD-10-CM | POA: Diagnosis not present

## 2024-03-15 DIAGNOSIS — Z85828 Personal history of other malignant neoplasm of skin: Secondary | ICD-10-CM | POA: Diagnosis not present

## 2024-03-15 DIAGNOSIS — L820 Inflamed seborrheic keratosis: Secondary | ICD-10-CM | POA: Diagnosis not present

## 2024-03-15 DIAGNOSIS — L814 Other melanin hyperpigmentation: Secondary | ICD-10-CM | POA: Diagnosis not present

## 2024-03-15 DIAGNOSIS — D2272 Melanocytic nevi of left lower limb, including hip: Secondary | ICD-10-CM | POA: Diagnosis not present

## 2024-04-18 DIAGNOSIS — H52203 Unspecified astigmatism, bilateral: Secondary | ICD-10-CM | POA: Diagnosis not present

## 2024-04-18 DIAGNOSIS — H524 Presbyopia: Secondary | ICD-10-CM | POA: Diagnosis not present

## 2024-04-18 DIAGNOSIS — D3131 Benign neoplasm of right choroid: Secondary | ICD-10-CM | POA: Diagnosis not present

## 2024-05-02 ENCOUNTER — Other Ambulatory Visit: Payer: Self-pay | Admitting: Gastroenterology

## 2024-07-31 DIAGNOSIS — M792 Neuralgia and neuritis, unspecified: Secondary | ICD-10-CM | POA: Diagnosis not present

## 2024-07-31 DIAGNOSIS — B029 Zoster without complications: Secondary | ICD-10-CM | POA: Diagnosis not present

## 2024-10-16 DIAGNOSIS — N952 Postmenopausal atrophic vaginitis: Secondary | ICD-10-CM | POA: Diagnosis not present

## 2024-10-16 DIAGNOSIS — N3281 Overactive bladder: Secondary | ICD-10-CM | POA: Diagnosis not present

## 2024-10-16 DIAGNOSIS — N201 Calculus of ureter: Secondary | ICD-10-CM | POA: Diagnosis not present

## 2024-10-16 DIAGNOSIS — N3020 Other chronic cystitis without hematuria: Secondary | ICD-10-CM | POA: Diagnosis not present

## 2024-11-01 DIAGNOSIS — Z23 Encounter for immunization: Secondary | ICD-10-CM | POA: Diagnosis not present

## 2024-11-02 DIAGNOSIS — N3020 Other chronic cystitis without hematuria: Secondary | ICD-10-CM | POA: Diagnosis not present

## 2024-11-30 ENCOUNTER — Ambulatory Visit: Admitting: Gastroenterology

## 2024-11-30 ENCOUNTER — Encounter: Payer: Self-pay | Admitting: Gastroenterology

## 2024-11-30 VITALS — BP 118/70 | HR 70 | Ht 63.5 in | Wt 144.4 lb

## 2024-11-30 DIAGNOSIS — Z9889 Other specified postprocedural states: Secondary | ICD-10-CM

## 2024-11-30 DIAGNOSIS — K219 Gastro-esophageal reflux disease without esophagitis: Secondary | ICD-10-CM

## 2024-11-30 DIAGNOSIS — K582 Mixed irritable bowel syndrome: Secondary | ICD-10-CM | POA: Diagnosis not present

## 2024-11-30 DIAGNOSIS — R1013 Epigastric pain: Secondary | ICD-10-CM | POA: Diagnosis not present

## 2024-11-30 DIAGNOSIS — K2100 Gastro-esophageal reflux disease with esophagitis, without bleeding: Secondary | ICD-10-CM | POA: Diagnosis not present

## 2024-11-30 NOTE — Patient Instructions (Signed)
 Follow-up as needed.   _______________________________________________________  If your blood pressure at your visit was 140/90 or greater, please contact your primary care physician to follow up on this.  _______________________________________________________  If you are age 78 or older, your body mass index should be between 23-30. Your Body mass index is 25.17 kg/m. If this is out of the aforementioned range listed, please consider follow up with your Primary Care Provider.  If you are age 53 or younger, your body mass index should be between 19-25. Your Body mass index is 25.17 kg/m. If this is out of the aformentioned range listed, please consider follow up with your Primary Care Provider.   ________________________________________________________  The [place] [facility] providers would like to encourage you to use MYCHART to communicate with providers for non-urgent requests or questions.  Due to long hold times on the telephone, sending your provider a message by [facility] may be a faster and more efficient way to get a response.  Please allow 48 business hours for a response.  Please remember that this is for non-urgent requests.  _______________________________________________________  [facility] is using a team-based approach to care.  Your team is made up of your doctor and two to three APPS. Our APPS (Nurse Practitioners and Physician Assistants) work with your physician to ensure care continuity for you. They are fully qualified to address your health concerns and develop a treatment plan. They communicate directly with your gastroenterologist to care for you. Seeing the Advanced Practice Practitioners on your physician's team can help you by facilitating care more promptly, often allowing for earlier appointments, access to diagnostic testing, procedures, and other specialty referrals.   Thank you for choosing me and [facility].  Dr Sandor Flatter

## 2024-11-30 NOTE — Progress Notes (Signed)
 "  Chief Complaint:   GERD, dyspepsia  GI History: Kim Chavez is a 78 y.o. female with history of CKD 3, anxiety, depression, OSA (on CPAP), HTN, hypothyroidism, migraines, previously referred to me by Dr. Nandigam for TIF for treatment of GERD with erosive esophagitis.   She has a long-standing hx of GERD and has been on multiple medications over the years as outlined below. Most recently recommended changed from Protonix  to Dexilant  60 mg/day in 09/2022, but insurance would not cover Dexilant , so resumed Protonix  40 mg BID along with continued Pepcid  and sucralfate .   Reports being recently diagnosed with CKD3, and she is very concerned about PPI effect, so stopped taking the Protonix .  Was still having breakthrough symptoms when taking high-dose PPI though.  Underwent TIF on 04/20/2023.  Initially weaned off all acid suppression therapy, but eventually with episodic breakthrough heartburn and regurgitation.  Restarted Protonix  40 mg daily with good control.  Patient states that overall symptoms are much improved since TIF since she was previously having regular breakthrough despite high-dose PPI.   GERD history: -Index symptoms: Heartburn, regurgitation, increased throat clearing.  No dysphagia -Exacerbating features: Spicy, tomato-based, soda. Supine. -Medications trialed: Pantoprazole , Pepcid , sucralfate , Nexium, Prilosec, Prevacid -Current medications: Protonix  40 mg bid -Complications: Erosive esophagitis   GERD evaluation: -Barium esophagram: 12/2016: Normal motility -Esophageal Manometry: 01/2023: Normal relaxation of EG junction.  No dysmotility -pH/Impedance: None -Bravo: None   Endoscopic History: - 01/08/2022: EGD: LA Grade C reflux esophagitis, non-H. pylori gastritis, erythematous duodenopathy (path benign) - 04/20/2023: EGD with TIF: LA Grade A esophagitis, Hill grade 2 valve, mild gastritis (path benign).  Successful TIF with 18 Serofuse fasteners placed     Separately,  history of IBS mixed type and follows with Dr. Trenda again.  Treated with Benefiber, lactose-free diet. - 07/01/2018: Colonoscopy at [facility]: multiple polyps removed descending colon, pathology consistent with tubular adenomas - 01/08/2022: Colonoscopy: Normal colon with biopsies negative for MC.  Sigmoid/descending colon diverticulosis, internal hemorrhoids.  HPI:     Patient is a 78 y.o. female presenting to the [facility] for follow-up.  She was last seen by me on 11/17/2023.  At that time, was having episodic breakthrough heartburn, regurgitation, dyspepsia.  Restarted Protonix  40 mg daily with good control.  Unfortunately, she had a significant case of shingles diagnosed in August with pain, vesicles, blisters lasting into October.  Was treated with 3 different agents by her PCP.  No record available for review, but sounds like she was treated with Neurontin, antiviral therapy, and likely a steroid.  During that time, she developed dyspepsia and epigastric pain, and started herself on some leftover sucralfate  x 1 month with relief of symptoms.  Symptoms are now minimal and her abdomen actually feels better now than it did even before shingles.  Reflux symptoms are otherwise well-controlled with her Protonix  40 mg daily.  From a reflux standpoint, though symptoms are still significantly better since TIF.  Still very happy that she went through the procedure.   Review of systems:     No chest pain, no SOB, no fevers, no urinary sx   Past Medical History:  Diagnosis Date   Anxiety    Breast cancer (HCC) 2011   BILATERAL BREAST REMOVED   Depression    Fibromyalgia    GERD (gastroesophageal reflux disease)    Hypertension    Hypothyroidism    Migraine    MIGRAINES   Overactive bladder    Pneumonia    several  times   RUQ pain    Sleep apnea    uses c-pap machine   Thyroid  disease     Patient's surgical history, family medical history, social history, medications and  allergies were all reviewed in Epic    Current Outpatient Medications  Medication Sig Dispense Refill   azelastine (ASTELIN) 0.1 % nasal spray Place 2 sprays into both nostrils 2 (two) times daily. Use in each nostril as directed     cetirizine (ZYRTEC) 10 MG tablet Take 10 mg by mouth daily.     chlorpheniramine (CHLOR-TRIMETON) 4 MG tablet Take 4 mg by mouth at bedtime. (Patient not taking: Reported on 12/21/2023)     Cholecalciferol  (VITAMIN D3) 50 MCG (2000 UT) capsule Take 2,000 Units by mouth daily.      Coenzyme Q10 (CO Q-10) 100 MG CAPS Take 100 mg by mouth every morning.     HYDROcodone -acetaminophen  (NORCO/VICODIN) 5-325 MG tablet Take 1 tablet by mouth as needed for moderate pain (pain score 4-6).     hydrocortisone 2.5 % cream Apply 1 Application topically daily as needed (on ears fro rash).     Lactobacillus-Inulin (CULTURELLE DIGESTIVE DAILY PO) Take 1 capsule by mouth daily.     levothyroxine  (SYNTHROID , LEVOTHROID) 50 MCG tablet Take 50 mcg by mouth daily before breakfast.     metoprolol  succinate (TOPROL -XL) 50 MG 24 hr tablet Take 50 mg by mouth 2 (two) times daily.     NON FORMULARY CPAP: At bedtime (nightly)     NON FORMULARY Place 2 mg vaginally See admin instructions. Estradiol  cream 2 mg every 6 days     olmesartan (BENICAR) 20 MG tablet Take 20 mg by mouth daily.     pantoprazole  (PROTONIX ) 40 MG tablet TAKE 1 TABLET(40 MG) BY MOUTH DAILY 90 tablet 3   simvastatin  (ZOCOR ) 40 MG tablet Take 40 mg by mouth at bedtime.     Wheat Dextrin (BENEFIBER) POWD Use 1 tablespoon twice a day (Patient taking differently: Take 15 mLs by mouth daily.)  0   zolpidem  (AMBIEN ) 10 MG tablet Take 10 mg by mouth at bedtime.     No current facility-administered medications for this visit.    Physical Exam:     There were no vitals taken for this visit.  GENERAL:  Pleasant female in NAD PSYCH: : Cooperative, normal affect CARDIAC:  RRR, no murmur heard, no peripheral edema PULM:  Normal respiratory effort, lungs CTA bilaterally, no wheezing ABDOMEN:  Nondistended, soft, nontender. No obvious masses, no hepatomegaly,  normal bowel sounds SKIN:  turgor, no lesions seen Musculoskeletal:  Normal muscle tone, normal strength NEURO: Alert and oriented x 3, no focal neurologic deficits   IMPRESSION and PLAN:    1) GERD with erosive esophagitis 2) Fundoplication 78 year old female longstanding history of GERD c/b erosive esophagitis, now s/p TIF in 04/2023.  Has had excellent response to fundoplication.  Throat clearing has resolved.  Had delayed breakthrough heartburn/regurgitation, which is now completely controlled since restarting Protonix  40 mg daily.  Overall represents vast improvement from prior to surgery, and patient overall very pleased with results. - Resume Protonix  40 mg daily - If symptoms still well-controlled, can consider reducing to 20 mg daily and can always consider weaning back off therapy in the future to evaluate - Will release her care back to Dr. Nandigam for her other GI symptoms.  Can otherwise follow-up with me as needed  3) Dyspepsia 4) Epigastric pain Symptoms started during treatment for shingles.  Based on timing  and clinical description, certainly conceivable that she developed PUD or at least gastritis related to likely corticosteroids Rx.  Good response to sucralfate  and symptoms have now resolved. - To call me if symptoms recur   5) IBS mixed type - Symptoms well-controlled - Continue current management - As above, we will release her care back to Dr. Shila         Can otherwise follow-up with me on an as-needed basis  I spent 30 minutes of time, including in depth chart review, independent review of results as outlined above, communicating results with the patient directly, face-to-face time with the patient, coordinating care, and ordering studies and medications as appropriate, and documentation.    Kim Chavez V Kim Chavez ,DO,  FACG 11/30/2024, 1:20 PM  "
